# Patient Record
Sex: Female | Born: 1965
Health system: Southern US, Community
[De-identification: ages and names within clinical notes are randomized; demographics above are authoritative.]

## PROBLEM LIST (undated history)

## (undated) DIAGNOSIS — I714 Abdominal aortic aneurysm, without rupture, unspecified: Secondary | ICD-10-CM

## (undated) DIAGNOSIS — R1011 Right upper quadrant pain: Secondary | ICD-10-CM

## (undated) DIAGNOSIS — Z87442 Personal history of urinary calculi: Secondary | ICD-10-CM

## (undated) DIAGNOSIS — R630 Anorexia: Secondary | ICD-10-CM

## (undated) DIAGNOSIS — F17200 Nicotine dependence, unspecified, uncomplicated: Secondary | ICD-10-CM

## (undated) DIAGNOSIS — I2510 Atherosclerotic heart disease of native coronary artery without angina pectoris: Secondary | ICD-10-CM

## (undated) DIAGNOSIS — R270 Ataxia, unspecified: Secondary | ICD-10-CM

## (undated) DIAGNOSIS — N390 Urinary tract infection, site not specified: Secondary | ICD-10-CM

## (undated) DIAGNOSIS — I219 Acute myocardial infarction, unspecified: Secondary | ICD-10-CM

## (undated) DIAGNOSIS — I10 Essential (primary) hypertension: Secondary | ICD-10-CM

## (undated) DIAGNOSIS — R109 Unspecified abdominal pain: Secondary | ICD-10-CM

## (undated) DIAGNOSIS — N200 Calculus of kidney: Secondary | ICD-10-CM

## (undated) DIAGNOSIS — R634 Abnormal weight loss: Secondary | ICD-10-CM

## (undated) HISTORY — DX: Abdominal aortic aneurysm, without rupture: I71.4

## (undated) HISTORY — DX: Abdominal aortic aneurysm, without rupture, unspecified: I71.40

## (undated) HISTORY — DX: Unspecified abdominal pain: R10.9

## (undated) HISTORY — PX: LITHOTRIPSY: SUR834

## (undated) HISTORY — DX: Anorexia: R63.0

## (undated) HISTORY — DX: Abnormal weight loss: R63.4

## (undated) HISTORY — DX: Atherosclerotic heart disease of native coronary artery without angina pectoris: I25.10

## (undated) HISTORY — DX: Right upper quadrant pain: R10.11

## (undated) HISTORY — DX: Nicotine dependence, unspecified, uncomplicated: F17.200

---

## 2006-04-21 ENCOUNTER — Emergency Department (HOSPITAL_COMMUNITY): Admission: EM | Admit: 2006-04-21 | Discharge: 2006-04-21 | Payer: Self-pay | Admitting: Emergency Medicine

## 2006-05-12 ENCOUNTER — Ambulatory Visit (HOSPITAL_COMMUNITY): Admission: RE | Admit: 2006-05-12 | Discharge: 2006-05-12 | Payer: Self-pay | Admitting: Urology

## 2006-05-17 ENCOUNTER — Ambulatory Visit (HOSPITAL_COMMUNITY): Admission: RE | Admit: 2006-05-17 | Discharge: 2006-05-17 | Payer: Self-pay | Admitting: Urology

## 2006-05-23 ENCOUNTER — Ambulatory Visit (HOSPITAL_COMMUNITY): Admission: RE | Admit: 2006-05-23 | Discharge: 2006-05-23 | Payer: Self-pay | Admitting: Urology

## 2006-06-23 ENCOUNTER — Ambulatory Visit (HOSPITAL_COMMUNITY): Admission: RE | Admit: 2006-06-23 | Discharge: 2006-06-23 | Payer: Self-pay | Admitting: Urology

## 2012-04-03 ENCOUNTER — Encounter (HOSPITAL_COMMUNITY): Payer: Self-pay | Admitting: *Deleted

## 2012-04-03 ENCOUNTER — Emergency Department (HOSPITAL_COMMUNITY)
Admission: EM | Admit: 2012-04-03 | Discharge: 2012-04-03 | Disposition: A | Discharge status: Home / Self Care | Payer: Self-pay | Attending: Emergency Medicine | Admitting: Emergency Medicine

## 2012-04-03 ENCOUNTER — Emergency Department (HOSPITAL_COMMUNITY): Payer: Self-pay

## 2012-04-03 DIAGNOSIS — R079 Chest pain, unspecified: Secondary | ICD-10-CM | POA: Insufficient documentation

## 2012-04-03 DIAGNOSIS — R42 Dizziness and giddiness: Secondary | ICD-10-CM | POA: Insufficient documentation

## 2012-04-03 DIAGNOSIS — I10 Essential (primary) hypertension: Secondary | ICD-10-CM | POA: Insufficient documentation

## 2012-04-03 DIAGNOSIS — F17200 Nicotine dependence, unspecified, uncomplicated: Secondary | ICD-10-CM | POA: Insufficient documentation

## 2012-04-03 DIAGNOSIS — R109 Unspecified abdominal pain: Secondary | ICD-10-CM | POA: Insufficient documentation

## 2012-04-03 LAB — CBC WITH DIFFERENTIAL/PLATELET
Basophils Absolute: 0 10*3/uL (ref 0.0–0.1)
Basophils Relative: 0 % (ref 0–1)
Eosinophils Absolute: 0.1 10*3/uL (ref 0.0–0.7)
Eosinophils Relative: 1 % (ref 0–5)
HCT: 39.5 % (ref 36.0–46.0)
Hemoglobin: 13.8 g/dL (ref 12.0–15.0)
MCH: 33.7 pg (ref 26.0–34.0)
MCHC: 34.9 g/dL (ref 30.0–36.0)
MCV: 96.3 fL (ref 78.0–100.0)
Monocytes Absolute: 0.5 10*3/uL (ref 0.1–1.0)
Monocytes Relative: 5 % (ref 3–12)
RDW: 12 % (ref 11.5–15.5)

## 2012-04-03 LAB — COMPREHENSIVE METABOLIC PANEL WITH GFR
ALT: 9 U/L (ref 0–35)
AST: 11 U/L (ref 0–37)
Albumin: 4.3 g/dL (ref 3.5–5.2)
Alkaline Phosphatase: 85 U/L (ref 39–117)
BUN: 6 mg/dL (ref 6–23)
CO2: 23 meq/L (ref 19–32)
Calcium: 9.8 mg/dL (ref 8.4–10.5)
Chloride: 102 meq/L (ref 96–112)
Creatinine, Ser: 0.65 mg/dL (ref 0.50–1.10)
GFR calc Af Amer: >90 mL/min
GFR calc non Af Amer: >90 mL/min
Glucose, Bld: 133 mg/dL — ABNORMAL HIGH (ref 70–99)
Potassium: 3.2 meq/L — ABNORMAL LOW (ref 3.5–5.1)
Sodium: 138 meq/L (ref 135–145)
Total Bilirubin: 0.2 mg/dL — ABNORMAL LOW (ref 0.3–1.2)
Total Protein: 7.7 g/dL (ref 6.0–8.3)

## 2012-04-03 LAB — URINALYSIS, ROUTINE W REFLEX MICROSCOPIC
Bilirubin Urine: NEGATIVE
Ketones, ur: NEGATIVE mg/dL
Nitrite: NEGATIVE
pH: 6 (ref 5.0–8.0)

## 2012-04-03 MED ORDER — HYDROCODONE-ACETAMINOPHEN 5-325 MG PO TABS: 1.0000 | ORAL_TABLET | ORAL | Status: AC | PRN

## 2012-04-03 MED ORDER — SODIUM CHLORIDE 0.9 % IV BOLUS (SEPSIS)
500.0000 mL | Freq: Once | INTRAVENOUS | Status: AC
  Administered 2012-04-03: 500 mL via INTRAVENOUS

## 2012-04-03 MED ORDER — ONDANSETRON HCL 4 MG/2ML IJ SOLN
4.0000 mg | Freq: Once | INTRAMUSCULAR | Status: AC
  Administered 2012-04-03: 4 mg via INTRAVENOUS
  Filled 2012-04-03: qty 2

## 2012-04-03 MED ORDER — SODIUM CHLORIDE 0.9 % IV SOLN
INTRAVENOUS | Status: DC
  Administered 2012-04-03: 11:00:00 via INTRAVENOUS

## 2012-04-03 MED ORDER — ONDANSETRON HCL 8 MG PO TABS: 8.0000 mg | ORAL_TABLET | ORAL | Status: AC | PRN

## 2012-04-03 MED ORDER — HYDROMORPHONE HCL PF 1 MG/ML IJ SOLN
0.5000 mg | Freq: Once | INTRAMUSCULAR | Status: AC
  Administered 2012-04-03: 0.5 mg via INTRAVENOUS
  Filled 2012-04-03: qty 1

## 2012-04-03 MED ORDER — IOHEXOL 300 MG/ML  SOLN
100.0000 mL | Freq: Once | INTRAMUSCULAR | Status: AC | PRN
  Administered 2012-04-03: 100 mL via INTRAVENOUS

## 2012-04-03 MED ORDER — LISINOPRIL-HYDROCHLOROTHIAZIDE 10-12.5 MG PO TABS: 1.0000 | ORAL_TABLET | Freq: Every day | ORAL | Status: DC

## 2012-04-03 NOTE — ED Notes (Addendum)
Pt c/o pain to right side of chest area, pt states that she has problems with pain in this area since having kidney stones "crushed" several years ago, but pain is worse over the past day or so, pt also started experiencing dizziness this am around 6:00, pt states that she usually gets dizzy when her blood pressure is elevated, bp at home was 213/118, pt also states that she had several sharp pains in left side of chest area this am, admits to n/v with the sharp chest pains, pt reports weight loss of 15 pounds recently due to diarrhea.

## 2012-04-03 NOTE — ED Notes (Signed)
Given requested water to drink, md notified and order obtained.

## 2012-04-03 NOTE — ED Notes (Signed)
Pt up to bathroom, voices no complaints, awaiting ct scan

## 2012-04-03 NOTE — ED Provider Notes (Signed)
History    This chart was scribed for Tara Hutching, MD, MD by Smitty Pluck. The patient was seen in room APA10 and the patient's care was started at 10:21AM.   CSN: [id number]  Arrival date & time 04/03/12  8295   First MD Initiated Contact with Patient 04/03/12 1021      Chief Complaint  Patient presents with  . Chest Pain  . Abdominal Pain  . Dizziness    (Consider location/radiation/quality/duration/timing/severity/associated sxs/prior treatment) The history is provided by the patient. No language interpreter was used.   Tara Bright is a 46 y.o. female who presents to the Emergency Department complaining of constant, moderate right flank onset 2 months. Pt reports hx of kidney stones (right 14 cm kidney stone removed by lithotripsy in 2007 by Dr Sharmon Revere). Pt reports that pain is aggravated by movement. Pt reports that she has lost approximately 15 lbs due to vomiting and diarrhea. Pt reports that her urine color has changed to orange but denies dysuria. Reports having chills. Pt reports HTN (stopped taking medication in 2004).    Pt does not have PCP   History reviewed. No pertinent past medical history.  Past Surgical History  Procedure Date  . Lithotripsy     No family history on file.  History  Substance Use Topics  . Smoking status: Current Some Day Smoker  . Smokeless tobacco: Not on file  . Alcohol Use: No    OB History    Grav Para Term Preterm Abortions TAB SAB Ect Mult Living                  Review of Systems  All other systems reviewed and are negative.  10 Systems reviewed and all are negative for acute change except as noted in the HPI.   Allergies  Codeine  Home Medications  No current outpatient prescriptions on file.  BP 192/111  Pulse 105  Temp 98.3 F (36.8 C)  Resp 18  Ht 5\' 3"  (1.6 m)  Wt 123 lb (55.792 kg)  BMI 21.79 kg/m2  SpO2 100%  Physical Exam  Nursing note and vitals reviewed. Constitutional: She is oriented to  person, place, and time. She appears well-developed and well-nourished.  HENT:  Head: Normocephalic and atraumatic.  Eyes: Conjunctivae and EOM are normal. Pupils are equal, round, and reactive to light.  Neck: Normal range of motion. Neck supple.  Cardiovascular: Normal rate, regular rhythm and normal heart sounds.   Pulmonary/Chest: Effort normal and breath sounds normal.  Abdominal: Soft. Bowel sounds are normal. There is tenderness (right flank).  Musculoskeletal: Normal range of motion.  Neurological: She is alert and oriented to person, place, and time.  Skin: Skin is warm and dry.  Psychiatric: She has a normal mood and affect.    ED Course  Procedures (including critical care time) DIAGNOSTIC STUDIES: Oxygen Saturation is 100% on room air, normal by my interpretation.    COORDINATION OF CARE: 10:27 AM Discussed pt ED treatment with pt  10:45 AM Ordered:   Medications  0.9 %  sodium chloride infusion (  Intravenous New Bag/Given 04/03/12 1120)  ibuprofen (ADVIL,MOTRIN) 200 MG tablet (not administered)  tetrahydrozoline (VISINE) 0.05 % ophthalmic solution (not administered)  sodium chloride 0.9 % bolus 500 mL (500 mL Intravenous Given 04/03/12 1047)  HYDROmorphone (DILAUDID) injection 0.5 mg (0.5 mg Intravenous Given 04/03/12 1047)  ondansetron (ZOFRAN) injection 4 mg (4 mg Intravenous Given 04/03/12 1047)  iohexol (OMNIPAQUE) 300 MG/ML solution 100 mL (100  mL Intravenous Contrast Given 04/03/12 1154)       Labs Reviewed  COMPREHENSIVE METABOLIC PANEL - Abnormal; Notable for the following:    Potassium 3.2 (*)     Glucose, Bld 133 (*)     Total Bilirubin 0.2 (*)     All other components within normal limits  URINALYSIS, ROUTINE W REFLEX MICROSCOPIC - Abnormal; Notable for the following:    Color, Urine STRAW (*)     Specific Gravity, Urine <1.005 (*)     Hgb urine dipstick MODERATE (*)     All other components within normal limits  CBC WITH DIFFERENTIAL  URINE  MICROSCOPIC-ADD ON   Ct Abdomen Pelvis W Contrast  04/03/2012  *RADIOLOGY REPORT*  Clinical Data: Right flank pain and abdominal pain for 2 months, worse in the last 2 weeks.  CT ABDOMEN AND PELVIS WITH CONTRAST  Technique:  Multidetector CT imaging of the abdomen and pelvis was performed following the standard protocol during bolus administration of intravenous contrast.  Contrast: OMNIPAQUE IOHEXOL 300 MG/ML  SOLN  Comparison: 09/28 of seven  Findings: Minor atelectasis at the dependent lung bases.  The lung bases are otherwise clear.  The heart is normal in size.  The liver, spleen, gallbladder and pancreas are within normal limits.  No bile duct dilation.  No adrenal masses.  2 mm nonobstructing stone in the mid pole of the right kidney. There is a dilated calix of the right kidney upper pole.  No renal masses.  No hydronephrosis.  There is symmetric renal enhancement and excretion.  Normal ureters, with no evidence of a ureteral stone.  Normal bladder.  Normal uterus.  Left ovary demonstrates a dominant cyst measuring approximate 3.3 cm.  Trace amount pelvic free fluid is presumed to be physiologic.  No adenopathy.  There is mild dilation of the infrarenal abdominal aorta where there is also atherosclerotic disease.  It measures 2.4 cm in greatest anterior posterior dimension.  The bowel is unremarkable. I do not appreciate a normal appendix, but there are no findings of acute appendicitis.  There are degenerative changes noted of the lower thoracic and lumbar spine, relatively mild.  No osteoblastic or osteolytic lesions.  IMPRESSION: No acute findings.  2 mm nonobstructing stone noted in the mid pole right kidney.  No ureteral stones or signs for obstruction.  Normal appendix is not discretely seen, but no evidence of appendicitis.  Chronic findings include ectasia and atherosclerosis of the abdominal aorta.  There are also degenerative changes of the visualized spine.  Note is made of a left ovarian  cyst.  This is presumed to be physiologic.  Mild lung base atelectasis.   Original Report Authenticated By: Domenic Moras, M.D.      No diagnosis found.  Date: 04/03/2012  Rate: 83  Rhythm: normal sinus rhythm  QRS Axis: normal  Intervals: normal  ST/T Wave abnormalities: normal  Conduction Disutrbances: none  Narrative Interpretation: unremarkable      MDM  Test results discussed with patient. Small stone in the mid pole of the right kidney.  Discussed patient's long-standing hypertension. Will start combination ACE inhibitor, diuretic.  Patient understands to get primary care followup.  I personally performed the services described in this documentation, which was scribed in my presence. The recorded information has been reviewed and considered.        Tara Hutching, MD 04/03/12 [phone]

## 2012-04-03 NOTE — ED Notes (Signed)
Warm blankets given.  Pt resting.

## 2014-07-25 DIAGNOSIS — R270 Ataxia, unspecified: Secondary | ICD-10-CM

## 2014-07-25 HISTORY — DX: Ataxia, unspecified: R27.0

## 2014-09-21 ENCOUNTER — Encounter (HOSPITAL_COMMUNITY): Payer: Self-pay | Admitting: Emergency Medicine

## 2014-09-21 ENCOUNTER — Emergency Department (HOSPITAL_COMMUNITY)
Admission: EM | Admit: 2014-09-21 | Discharge: 2014-09-21 | Discharge status: Against Medical Advice | Payer: Self-pay | Attending: Emergency Medicine | Admitting: Emergency Medicine

## 2014-09-21 ENCOUNTER — Emergency Department (HOSPITAL_COMMUNITY): Payer: Self-pay

## 2014-09-21 DIAGNOSIS — Z880 Allergy status to penicillin: Secondary | ICD-10-CM | POA: Insufficient documentation

## 2014-09-21 DIAGNOSIS — Z87448 Personal history of other diseases of urinary system: Secondary | ICD-10-CM | POA: Insufficient documentation

## 2014-09-21 DIAGNOSIS — H539 Unspecified visual disturbance: Secondary | ICD-10-CM | POA: Insufficient documentation

## 2014-09-21 DIAGNOSIS — F1310 Sedative, hypnotic or anxiolytic abuse, uncomplicated: Secondary | ICD-10-CM | POA: Insufficient documentation

## 2014-09-21 DIAGNOSIS — R260 Ataxic gait: Secondary | ICD-10-CM | POA: Insufficient documentation

## 2014-09-21 DIAGNOSIS — Z720 Tobacco use: Secondary | ICD-10-CM | POA: Insufficient documentation

## 2014-09-21 DIAGNOSIS — R270 Ataxia, unspecified: Secondary | ICD-10-CM

## 2014-09-21 DIAGNOSIS — Z7982 Long term (current) use of aspirin: Secondary | ICD-10-CM | POA: Insufficient documentation

## 2014-09-21 DIAGNOSIS — Z79899 Other long term (current) drug therapy: Secondary | ICD-10-CM | POA: Insufficient documentation

## 2014-09-21 LAB — RAPID URINE DRUG SCREEN, HOSP PERFORMED
Amphetamines: NOT DETECTED
Barbiturates: NOT DETECTED
Benzodiazepines: POSITIVE — AB
COCAINE: NOT DETECTED
Opiates: NOT DETECTED
TETRAHYDROCANNABINOL: NOT DETECTED

## 2014-09-21 LAB — CBC
HCT: 39.4 % (ref 36.0–46.0)
Hemoglobin: 14 g/dL (ref 12.0–15.0)
MCH: 34.1 pg — ABNORMAL HIGH (ref 26.0–34.0)
MCHC: 35.5 g/dL (ref 30.0–36.0)
MCV: 95.9 fL (ref 78.0–100.0)
Platelets: 221 10*3/uL (ref 150–400)
RBC: 4.11 MIL/uL (ref 3.87–5.11)
RDW: 11.6 % (ref 11.5–15.5)
WBC: 7.7 10*3/uL (ref 4.0–10.5)

## 2014-09-21 LAB — DIFFERENTIAL
BASOS ABS: 0 10*3/uL (ref 0.0–0.1)
Basophils Relative: 0 % (ref 0–1)
Eosinophils Absolute: 0.1 10*3/uL (ref 0.0–0.7)
Eosinophils Relative: 1 % (ref 0–5)
LYMPHS ABS: 2.6 10*3/uL (ref 0.7–4.0)
Lymphocytes Relative: 34 % (ref 12–46)
Monocytes Absolute: 0.6 10*3/uL (ref 0.1–1.0)
Monocytes Relative: 7 % (ref 3–12)
NEUTROS PCT: 58 % (ref 43–77)
Neutro Abs: 4.4 10*3/uL (ref 1.7–7.7)

## 2014-09-21 LAB — COMPREHENSIVE METABOLIC PANEL
ALT: 21 U/L (ref 0–35)
ANION GAP: 5 (ref 5–15)
AST: 20 U/L (ref 0–37)
Albumin: 4.5 g/dL (ref 3.5–5.2)
Alkaline Phosphatase: 59 U/L (ref 39–117)
BILIRUBIN TOTAL: 0.2 mg/dL — AB (ref 0.3–1.2)
BUN: 12 mg/dL (ref 6–23)
CHLORIDE: 111 mmol/L (ref 96–112)
CO2: 23 mmol/L (ref 19–32)
Calcium: 9.2 mg/dL (ref 8.4–10.5)
Creatinine, Ser: 0.67 mg/dL (ref 0.50–1.10)
GFR calc Af Amer: >90 mL/min (ref >90)
GFR calc non Af Amer: >90 mL/min (ref >90)
GLUCOSE: 81 mg/dL (ref 70–99)
POTASSIUM: 3.8 mmol/L (ref 3.5–5.1)
Sodium: 139 mmol/L (ref 135–145)
Total Protein: 7.6 g/dL (ref 6.0–8.3)

## 2014-09-21 LAB — URINALYSIS, ROUTINE W REFLEX MICROSCOPIC
Bilirubin Urine: NEGATIVE
GLUCOSE, UA: NEGATIVE mg/dL
KETONES UR: NEGATIVE mg/dL
Leukocytes, UA: NEGATIVE
NITRITE: NEGATIVE
PH: 6 (ref 5.0–8.0)
Protein, ur: NEGATIVE mg/dL
Specific Gravity, Urine: <1.005 — ABNORMAL LOW (ref 1.005–1.030)
Urobilinogen, UA: 0.2 mg/dL (ref 0.0–1.0)

## 2014-09-21 LAB — CBG MONITORING, ED: Glucose-Capillary: 78 mg/dL (ref 70–99)

## 2014-09-21 LAB — URINE MICROSCOPIC-ADD ON

## 2014-09-21 LAB — ETHANOL: Alcohol, Ethyl (B): <5 mg/dL (ref 0–9)

## 2014-09-21 LAB — APTT: aPTT: 30 seconds (ref 24–37)

## 2014-09-21 LAB — PROTIME-INR
INR: 0.96 (ref 0.00–1.49)
Prothrombin Time: 12.9 seconds (ref 11.6–15.2)

## 2014-09-21 LAB — TROPONIN I

## 2014-09-21 NOTE — ED Notes (Signed)
Pt is requesting to leave. Pt made aware of all health care related consequences that could result in her leaving.

## 2014-09-21 NOTE — ED Provider Notes (Signed)
CSN: [id number]     Arrival date & time 09/21/14  1234 History  This chart was scribed for Tara Essex, MD by Stephania Fragmin, ED Scribe. This patient was seen in room APA08/APA08 and the patient's care was started at 1:50 PM.    Chief Complaint  Patient presents with  . Headache   The history is provided by the patient. No language interpreter was used.    HPI Comments: Tara Bright is a 49 y.o. female who presents to the Emergency Department complaining of constant, severe dizziness, blurred vision in her right eye, high blood pressure, and pounding in her chest that worsened today. She endorses photophobia and SOB. Patient's last normal was 8 days ago -- she states she believes she had a mild stroke 7 days ago because she had sudden loss of the upper half of her vision in her right eye for a day and a half. She states her vision has returned, but it is still blurry. She also had the same dizziness and pounding in her chest all week, but states they were episodic and would come and go.  Patient denies taking medication for hypertension. Lying and sitting alleviate her dizziness. Patient has a history of kidney stones but no other past medical history, though she adds she hasn't seen a doctor in years because she doesn't like going to the doctor's. She is a smoker. She denies abdominal pain, chest pain, or sensitivity to noise. Patient denies drug use. She has never worn contacts.  PCP: none    Past Medical History  Diagnosis Date  . Renal disorder    Past Surgical History  Procedure Laterality Date  . Lithotripsy     Family History  Problem Relation Age of Onset  . Cancer Mother   . Diabetes Father   . Heart failure Father    History  Substance Use Topics  . Smoking status: Current Some Day Smoker -- 0.50 packs/day    Types: Cigarettes  . Smokeless tobacco: Never Used  . Alcohol Use: No   OB History    Gravida Para Term Preterm AB TAB SAB Ectopic Multiple Living   5 2 2  3  3          Review of Systems  A complete 10 system review of systems was obtained and all systems are negative except as noted in the HPI and PMH.    Allergies  Codeine; Other; and Penicillins  Home Medications   Prior to Admission medications   Medication Sig Start Date End Date Taking? Authorizing Provider  aspirin 325 MG tablet Take 325 mg by mouth daily as needed (blood flow).   Yes Historical Provider, MD  ibuprofen (ADVIL,MOTRIN) 200 MG tablet Take 400 mg by mouth every 6 (six) hours as needed for headache or moderate pain. Pain   Yes Historical Provider, MD  tetrahydrozoline (VISINE) 0.05 % ophthalmic solution Place 1 drop into both eyes daily.   Yes Historical Provider, MD  lisinopril-hydrochlorothiazide (PRINZIDE) 10-12.5 MG per tablet Take 1 tablet by mouth daily. Patient not taking: Reported on 09/21/2014 04/03/12 04/03/13  Nat Christen, MD   BP 172/97 mmHg  Pulse 69  Temp(Src) 98 F (36.7 C) (Oral)  Resp 15  Ht 5\' 3"  (1.6 m)  Wt 130 lb (58.968 kg)  BMI 23.03 kg/m2  SpO2 99%  LMP 09/21/2014 (Exact Date) Physical Exam  Constitutional: She is oriented to person, place, and time. She appears well-developed and well-nourished. No distress.  HENT:  Head: Normocephalic  and atraumatic.  Mouth/Throat: Oropharynx is clear and moist. No oropharyngeal exudate.  Eyes: Conjunctivae and EOM are normal. Pupils are equal, round, and reactive to light.  Neck: Normal range of motion. Neck supple.  No meningismus.  Cardiovascular: Normal rate, regular rhythm, normal heart sounds and intact distal pulses.   No murmur heard. Pulmonary/Chest: Effort normal and breath sounds normal. No respiratory distress.  Abdominal: Soft. There is no tenderness. There is no rebound and no guarding.  Musculoskeletal: Normal range of motion. She exhibits no edema or tenderness.  Neurological: She is alert and oriented to person, place, and time. No cranial nerve deficit. She exhibits normal muscle tone.  Coordination normal.  Superior visual field cut in the right eye. Positive Romberg. Ataxic gait.  No ataxia on finger to nose bilaterally. No pronator drift. 5/5 strength throughout. CN 2-12 intact. Equal grip strength. Sensation intact.   Skin: Skin is warm.  Psychiatric: She has a normal mood and affect. Her behavior is normal.  Nursing note and vitals reviewed.   ED Course  Procedures (including critical care time)  DIAGNOSTIC STUDIES: Oxygen Saturation is 100% on room air, normal by my interpretation.    COORDINATION OF CARE: 1:59 PM - Discussed treatment plan with pt at bedside which includes blood tests and CT scan, and pt agreed to plan.   Labs Review Labs Reviewed  CBC - Abnormal; Notable for the following:    MCH 34.1 (*)    All other components within normal limits  COMPREHENSIVE METABOLIC PANEL - Abnormal; Notable for the following:    Total Bilirubin 0.2 (*)    All other components within normal limits  URINE RAPID DRUG SCREEN (HOSP PERFORMED) - Abnormal; Notable for the following:    Benzodiazepines POSITIVE (*)    All other components within normal limits  URINALYSIS, ROUTINE W REFLEX MICROSCOPIC - Abnormal; Notable for the following:    Specific Gravity, Urine <1.005 (*)    Hgb urine dipstick LARGE (*)    All other components within normal limits  URINE MICROSCOPIC-ADD ON - Abnormal; Notable for the following:    Squamous Epithelial / LPF FEW (*)    All other components within normal limits  ETHANOL  PROTIME-INR  APTT  DIFFERENTIAL  TROPONIN I  CBG MONITORING, ED    Imaging Review Ct Head Wo Contrast  09/21/2014   CLINICAL DATA:  Visual field defect in the right eye 7 days ago. Right arm weakness. Abnormal sensation in the right side of the face. Intermittent dizziness.  EXAM: CT HEAD WITHOUT CONTRAST  TECHNIQUE: Contiguous axial images were obtained from the base of the skull through the vertex without intravenous contrast.  COMPARISON:  None.  FINDINGS:  No acute cortical infarct, hemorrhage, or mass lesion is present. The ventricles are of normal size. No significant extra-axial fluid collection is evident. The paranasal sinuses and mastoid air cells are clear. The calvarium is intact.  IMPRESSION: Negative CT of the head.   Electronically Signed   By: San Morelle M.D.   On: 09/21/2014 15:07     EKG Interpretation   Date/Time:  Sunday September 21 2014 13:22:27 EST Ventricular Rate:  76 PR Interval:  152 QRS Duration: 78 QT Interval:  376 QTC Calculation: 423 R Axis:   76 Text Interpretation:  Sinus rhythm No previous ECGs available Confirmed by  Gerson Fauth  MD, Nusaybah Ivie ([id number]) on 09/21/2014 3:00:25 PM     3:15 PM - Discussed test results, including negative CT scan, with patient. Discussed my  recommendation for hospital admission with patient, who states that she doesn't want to stay. I am giving her time to consider her decision.  MDM   Final diagnoses:  Visual disturbance  Ataxia   Visual field change one week ago with intermittent headaches, dizziness, difficulty walking and ataxia. No chest pain or shortness of breath.  Code stroke not activated due to delay in presentation.  CT negative for acute pathology. Labs unrevealing. Blood pressure has improved to 172/97.  Concern for stroke given patient's visual field change, ataxic gait and positive Romberg. Patient is adamant that she does not to be admitted. She understands that she could have a stroke which could be incapacitating and lead to death. Her blood pressure is uncontrolled.  Informed by nurse that patient left AMA. Patient stated she did not want to stay. She appears to have Capacity to make medical decisions. She understood that she could be having a stroke which can be incapacitating early to death. Her blood pressure is uncontrolled which could lead to stroke, heart attack, kidney damage, eye damage and possibly death.  I personally performed the services  described in this documentation, which was scribed in my presence. The recorded information has been reviewed and is accurate.   Tara Essex, MD 09/21/14 2130

## 2014-09-21 NOTE — ED Notes (Signed)
MD at bedside. 

## 2014-09-21 NOTE — ED Notes (Signed)
Pt reports R sided headache with dizziness, symptom onset last Sun. Pt also reports dizziness.

## 2016-06-21 ENCOUNTER — Emergency Department (HOSPITAL_COMMUNITY)
Admission: EM | Admit: 2016-06-21 | Discharge: 2016-06-21 | Disposition: A | Discharge status: Home / Self Care | Payer: Self-pay

## 2016-06-22 ENCOUNTER — Encounter (HOSPITAL_COMMUNITY): Payer: Self-pay | Admitting: *Deleted

## 2016-06-22 ENCOUNTER — Emergency Department (HOSPITAL_COMMUNITY)
Admission: EM | Admit: 2016-06-22 | Discharge: 2016-06-22 | Disposition: A | Discharge status: Home / Self Care | Payer: Self-pay | Attending: Emergency Medicine | Admitting: Emergency Medicine

## 2016-06-22 ENCOUNTER — Emergency Department (HOSPITAL_COMMUNITY): Payer: Self-pay

## 2016-06-22 DIAGNOSIS — R197 Diarrhea, unspecified: Secondary | ICD-10-CM | POA: Insufficient documentation

## 2016-06-22 DIAGNOSIS — N390 Urinary tract infection, site not specified: Secondary | ICD-10-CM | POA: Insufficient documentation

## 2016-06-22 DIAGNOSIS — N939 Abnormal uterine and vaginal bleeding, unspecified: Secondary | ICD-10-CM | POA: Insufficient documentation

## 2016-06-22 DIAGNOSIS — Z79899 Other long term (current) drug therapy: Secondary | ICD-10-CM | POA: Insufficient documentation

## 2016-06-22 DIAGNOSIS — F17210 Nicotine dependence, cigarettes, uncomplicated: Secondary | ICD-10-CM | POA: Insufficient documentation

## 2016-06-22 LAB — URINE MICROSCOPIC-ADD ON

## 2016-06-22 LAB — PREGNANCY, URINE: PREG TEST UR: NEGATIVE

## 2016-06-22 LAB — BASIC METABOLIC PANEL
ANION GAP: 10 (ref 5–15)
BUN: 12 mg/dL (ref 6–20)
CALCIUM: 9.4 mg/dL (ref 8.9–10.3)
CHLORIDE: 103 mmol/L (ref 101–111)
CO2: 25 mmol/L (ref 22–32)
Creatinine, Ser: 0.68 mg/dL (ref 0.44–1.00)
GFR calc non Af Amer: >60 mL/min (ref >60)
GLUCOSE: 92 mg/dL (ref 65–99)
POTASSIUM: 3.7 mmol/L (ref 3.5–5.1)
Sodium: 138 mmol/L (ref 135–145)

## 2016-06-22 LAB — CBC WITH DIFFERENTIAL/PLATELET
BASOS ABS: 0 10*3/uL (ref 0.0–0.1)
Basophils Relative: 0 %
Eosinophils Absolute: 0.1 10*3/uL (ref 0.0–0.7)
Eosinophils Relative: 1 %
HEMATOCRIT: 35.8 % — AB (ref 36.0–46.0)
HEMOGLOBIN: 12.1 g/dL (ref 12.0–15.0)
LYMPHS PCT: 19 %
Lymphs Abs: 1.9 10*3/uL (ref 0.7–4.0)
MCH: 34.3 pg — ABNORMAL HIGH (ref 26.0–34.0)
MCHC: 33.8 g/dL (ref 30.0–36.0)
MCV: 101.4 fL — ABNORMAL HIGH (ref 78.0–100.0)
Monocytes Absolute: 0.8 10*3/uL (ref 0.1–1.0)
Monocytes Relative: 8 %
NEUTROS ABS: 7.1 10*3/uL (ref 1.7–7.7)
Neutrophils Relative %: 72 %
Platelets: 309 10*3/uL (ref 150–400)
RBC: 3.53 MIL/uL — ABNORMAL LOW (ref 3.87–5.11)
RDW: 12.8 % (ref 11.5–15.5)
WBC: 9.9 10*3/uL (ref 4.0–10.5)

## 2016-06-22 LAB — URINALYSIS, ROUTINE W REFLEX MICROSCOPIC
Bilirubin Urine: NEGATIVE
Glucose, UA: NEGATIVE mg/dL
Ketones, ur: NEGATIVE mg/dL
Nitrite: POSITIVE — AB
PH: 6 (ref 5.0–8.0)
Specific Gravity, Urine: 1.01 (ref 1.005–1.030)

## 2016-06-22 LAB — WET PREP, GENITAL
Clue Cells Wet Prep HPF POC: NONE SEEN
SPERM: NONE SEEN
Trich, Wet Prep: NONE SEEN
Yeast Wet Prep HPF POC: NONE SEEN

## 2016-06-22 MED ORDER — DEXTROSE 5 % IV SOLN
1.0000 g | Freq: Once | INTRAVENOUS | Status: AC
  Administered 2016-06-22: 1 g via INTRAVENOUS
  Filled 2016-06-22: qty 10

## 2016-06-22 MED ORDER — OXYCODONE-ACETAMINOPHEN 5-325 MG PO TABS: 1.0000 | ORAL_TABLET | Freq: Four times a day (QID) | ORAL | 0 refills | Status: DC | PRN

## 2016-06-22 MED ORDER — OXYCODONE-ACETAMINOPHEN 5-325 MG PO TABS
ORAL_TABLET | ORAL | Status: AC
  Filled 2016-06-22: qty 1

## 2016-06-22 MED ORDER — CEPHALEXIN 500 MG PO CAPS: 500.0000 mg | ORAL_CAPSULE | Freq: Four times a day (QID) | ORAL | 0 refills | Status: DC

## 2016-06-22 MED ORDER — MEDROXYPROGESTERONE ACETATE 10 MG PO TABS: 10.0000 mg | ORAL_TABLET | Freq: Every day | ORAL | 0 refills | Status: DC

## 2016-06-22 MED ORDER — OXYCODONE-ACETAMINOPHEN 5-325 MG PO TABS
1.0000 | ORAL_TABLET | Freq: Once | ORAL | Status: AC
  Administered 2016-06-22: 1 via ORAL

## 2016-06-22 NOTE — Discharge Instructions (Signed)
I have given you a prescription for an antibiotic (Keflex) to use four times a day for 7 days. Please return if your symptoms worsen or fail to improve over the next week. I have also given you a pill to help stop your vaginal bleeding. Please follow up with Ob/Gyn. Note there is increased risk of blood clots with this medication.

## 2016-06-22 NOTE — ED Provider Notes (Signed)
[facility] DEPT Provider Note   CSN: [id number] Arrival date & time: 06/22/16  1001  History   Chief Complaint Chief Complaint  Patient presents with  . Flank Pain  . Vaginal Bleeding   HPI Tara Bright is a 50 y.o. female.  HPI Presenting with vaginal bleeding with clots, weakness, right flank pain, and foul odor to urine for 2 weeks. Reports she is perimenopausal. Using 1 pad per hour, about 10 pads per day. Also notes nausea and diarrhea as well as urinary frequency, urgency, and dysuria. Notes some hematuria, but unsure how long given vaginal bleeding. Denies vomiting. Unknown fever. Notes 13 years ago she was supposed to have a hysterectomy, however she had to unexpectedly move after her father passed away and did not have this done. No recent pap smears. Notes history of renal stones, last occurring 3 years ago. Smoker. Past Medical History:  Diagnosis Date  . Renal disorder     There are no active problems to display for this patient.   Past Surgical History:  Procedure Laterality Date  . LITHOTRIPSY      OB History    Gravida Para Term Preterm AB Living   5 2 2   3      SAB TAB Ectopic Multiple Live Births   3               Home Medications    Prior to Admission medications   Medication Sig Start Date End Date Taking? Authorizing Provider  acetaminophen (TYLENOL) 500 MG tablet Take 1,000 mg by mouth every 4 (four) hours as needed.   Yes Historical Provider, MD  CRANBERRY PO Take 2 tablets by mouth daily.   Yes Historical Provider, MD  ferrous sulfate 325 (65 FE) MG tablet Take 325 mg by mouth daily with breakfast.   Yes Historical Provider, MD  tetrahydrozoline (VISINE) 0.05 % ophthalmic solution Place 1 drop into both eyes daily.   Yes Historical Provider, MD  cephALEXin (KEFLEX) 500 MG capsule Take 1 capsule (500 mg total) by mouth 4 (four) times daily. 06/22/16   [place]ad[place] N Rumley, DO  medroxyPROGESTERone (PROVERA) 10 MG tablet Take 1 tablet (10 mg  total) by mouth daily. 06/22/16   [place] N Rumley, DO  oxyCODONE-acetaminophen (PERCOCET/ROXICET) 5-325 MG tablet Take 1 tablet by mouth every 6 (six) hours as needed for severe pain. 06/22/16   Lorna Few, DO    Family History Family History  Problem Relation Age of Onset  . Cancer Mother   . Diabetes Father   . Heart failure Father     Social History Social History  Substance Use Topics  . Smoking status: Current Some Day Smoker    Packs/day: 0.50    Types: Cigarettes  . Smokeless tobacco: Never Used  . Alcohol use No     Allergies   Codeine; Other; and Penicillins   Review of Systems Review of Systems  Constitutional: Negative for fever.  Respiratory: Negative for shortness of breath.   Cardiovascular: Negative for chest pain.  Gastrointestinal: Positive for diarrhea and nausea. Negative for abdominal pain and vomiting.  Genitourinary: Positive for dysuria, frequency, hematuria and urgency.  Skin: Negative for rash.  Neurological: Positive for weakness.     Physical Exam Updated Vital Signs BP 128/84   Pulse 78   Temp 98.1 F (36.7 C) (Oral)   Resp 18   Ht 5\' 3"  (1.6 m)   Wt 57.2 kg   LMP 06/08/2016   SpO2 99%  BMI 22.32 kg/m   Physical Exam  Constitutional: She is oriented to person, place, and time. She appears well-developed and well-nourished. No distress.  HENT:  Head: Normocephalic and atraumatic.  Eyes: Conjunctivae are normal.  Cardiovascular: Normal rate and regular rhythm.   No murmur heard. Pulmonary/Chest: Effort normal. No respiratory distress. She has no wheezes.  Abdominal: Soft. Bowel sounds are normal. She exhibits no distension. There is no tenderness.  CVA tenderness noted  Genitourinary:  Genitourinary Comments: Blood and clots noted in vaginal vault. No cervical motion tenderness noted. No vaginal wall tenderness. Mild tenderness in left adnexa.   Musculoskeletal: She exhibits no edema.  Lymphadenopathy:    She has no  cervical adenopathy.  Neurological: She is alert and oriented to person, place, and time.  Skin: No rash noted.  Psychiatric: She has a normal mood and affect. Her behavior is normal.    ED Treatments / Results  Labs (all labs ordered are listed, but only abnormal results are displayed) Labs Reviewed  URINALYSIS, ROUTINE W REFLEX MICROSCOPIC (NOT AT [facility]) - Abnormal; Notable for the following:       Result Value   APPearance HAZY (*)    Hgb urine dipstick LARGE (*)    Protein, ur TRACE (*)    Nitrite POSITIVE (*)    Leukocytes, UA LARGE (*)    All other components within normal limits  CBC WITH DIFFERENTIAL/PLATELET - Abnormal; Notable for the following:    RBC 3.53 (*)    HCT 35.8 (*)    MCV 101.4 (*)    MCH 34.3 (*)    All other components within normal limits  URINE MICROSCOPIC-ADD ON - Abnormal; Notable for the following:    Squamous Epithelial / LPF 0-5 (*)    Bacteria, UA MANY (*)    All other components within normal limits  WET PREP, GENITAL  URINE CULTURE  BASIC METABOLIC PANEL  PREGNANCY, URINE  CERVICOVAGINAL ANCILLARY ONLY    EKG  EKG Interpretation None      Radiology US Transvaginal Non-ob  Result Date: 06/22/2016 CLINICAL DATA:  Right flank pain, vaginal bleeding. EXAM: TRANSABDOMINAL AND TRANSVAGINAL ULTRASOUND OF PELVIS TECHNIQUE: Both transabdominal and transvaginal ultrasound examinations of the pelvis were performed. Transabdominal technique was performed for global imaging of the pelvis including uterus, ovaries, adnexal regions, and pelvic cul-de-sac. It was necessary to proceed with endovaginal exam following the transabdominal exam to visualize the endometrium and ovaries. COMPARISON:  None FINDINGS: Uterus Measurements: 10.0 x 5.7 x 4.9 cm. No fibroids or other mass visualized. Endometrium Thickness: 15 mm.  No focal abnormality visualized. Right ovary Measurements: 3.1 x 2.6 x 2.3 cm. 1.7 cm follicular cyst is noted. Left ovary Measurements: 1.8  x 1.8 x 1.5 cm. Normal appearance/no adnexal mass. Other findings No abnormal free fluid. IMPRESSION: Endometrial thickness is measured at 15 mm which is within normal limits for premenopausal patient. If bleeding remains unresponsive to hormonal or medical therapy, sonohysterogram should be considered for focal lesion work-up. (Ref: Radiological Reasoning: Algorithmic Workup of Abnormal Vaginal Bleeding with Endovaginal Sonography and Sonohysterography. AJR [id number]) Electronically Signed   By: Marijo Conception, M.D.   On: 06/22/2016 13:04   US Pelvis Complete  Result Date: 06/22/2016 CLINICAL DATA:  Right flank pain, vaginal bleeding. EXAM: TRANSABDOMINAL AND TRANSVAGINAL ULTRASOUND OF PELVIS TECHNIQUE: Both transabdominal and transvaginal ultrasound examinations of the pelvis were performed. Transabdominal technique was performed for global imaging of the pelvis including uterus, ovaries, adnexal regions, and pelvic cul-de-sac. It  was necessary to proceed with endovaginal exam following the transabdominal exam to visualize the endometrium and ovaries. COMPARISON:  None FINDINGS: Uterus Measurements: 10.0 x 5.7 x 4.9 cm. No fibroids or other mass visualized. Endometrium Thickness: 15 mm.  No focal abnormality visualized. Right ovary Measurements: 3.1 x 2.6 x 2.3 cm. 1.7 cm follicular cyst is noted. Left ovary Measurements: 1.8 x 1.8 x 1.5 cm. Normal appearance/no adnexal mass. Other findings No abnormal free fluid. IMPRESSION: Endometrial thickness is measured at 15 mm which is within normal limits for premenopausal patient. If bleeding remains unresponsive to hormonal or medical therapy, sonohysterogram should be considered for focal lesion work-up. (Ref: Radiological Reasoning: Algorithmic Workup of Abnormal Vaginal Bleeding with Endovaginal Sonography and Sonohysterography. AJR [id number]) Electronically Signed   By: Marijo Conception, M.D.   On: 06/22/2016 13:04    Procedures Procedures  (including critical care time)  Medications Ordered in ED Medications  oxyCODONE-acetaminophen (PERCOCET/ROXICET) 5-325 MG per tablet 1 tablet (not administered)  cefTRIAXone (ROCEPHIN) 1 g in dextrose 5 % 50 mL IVPB (1 g Intravenous New Bag/Given 06/22/16 1333)     Initial Impression / Assessment and Plan / ED Course  I have reviewed the triage vital signs and the nursing notes.  Pertinent labs & imaging results that were available during my care of the patient were reviewed by me and considered in my medical decision making (see chart for details).  Clinical Course   - BMP normal - CBC normal - Urinalysis with large hemoglobin, large leukocytes, positive nitrite. Microscopy with too numerous to count RBC and WBC, many bacteria. - Pelvic US with normal endometrial stripe, follicular cysts noted.  Final Clinical Impressions(s) / ED Diagnoses   Final diagnoses:  Vaginal bleeding  Lower urinary tract infectious disease  - Keflex prescribed at four times a day dosing with concerns of UTI with possible beginnings of Pyelonephritis. Return precautions discussed. Percocet given due to significant pain on exam.  - Provera given for vaginal bleeding given smoking status and age. CBC normal. Follow up with Ob/Gyn.  - Follow up with PCP  New Prescriptions New Prescriptions   CEPHALEXIN (KEFLEX) 500 MG CAPSULE    Take 1 capsule (500 mg total) by mouth 4 (four) times daily.   MEDROXYPROGESTERONE (PROVERA) 10 MG TABLET    Take 1 tablet (10 mg total) by mouth daily.   OXYCODONE-ACETAMINOPHEN (PERCOCET/ROXICET) 5-325 MG TABLET    Take 1 tablet by mouth every 6 (six) hours as needed for severe pain.     [place], [place] 06/22/16 [place], MD 06/22/16 [phone]

## 2016-06-22 NOTE — ED Triage Notes (Addendum)
Pt c/o vaginal bleeding with clots, weakness, right flank pain foul smelling urine, and feeling dehydrated that started 2 weeks ago. Pt reports she is perimenopausal. Pt reports she is using 1 pad per hour. Reports nausea, diarrhea. Denies vomiting. Unknown fever at home. Pt reports urinary frequency and hematuria that started before the vaginal bleeding.

## 2016-06-23 LAB — CERVICOVAGINAL ANCILLARY ONLY
CHLAMYDIA, DNA PROBE: NEGATIVE
Neisseria Gonorrhea: NEGATIVE

## 2016-06-25 LAB — URINE CULTURE: Culture: >=100000 — AB

## 2016-06-26 ENCOUNTER — Telehealth: Payer: Self-pay

## 2016-06-26 NOTE — Telephone Encounter (Signed)
Post ED Visit - Positive Culture Follow-up  Culture report reviewed by antimicrobial stewardship pharmacist:  []  Elenor Quinones, Pharm.D. []  Heide Guile, Pharm.D., BCPS []  Parks Neptune, Pharm.D. []  Alycia Rossetti, Pharm.D., BCPS []  [place], [place].D., BCPS, AAHIVP []  Legrand Como, Pharm.D., BCPS, AAHIVP []  Milus Glazier, Pharm.D. []  Stephens November, Pharm.D. Rachel Rumbarger Pharm D Positive urine culture Treated with Cephalexin, organism sensitive to the same and no further patient follow-up is required at this time.  Genia Del 06/26/2016, 8:52 AM

## 2017-04-05 ENCOUNTER — Inpatient Hospital Stay (HOSPITAL_COMMUNITY): Payer: Self-pay | Admitting: Certified Registered Nurse Anesthetist

## 2017-04-05 ENCOUNTER — Inpatient Hospital Stay (HOSPITAL_COMMUNITY): Payer: Self-pay

## 2017-04-05 ENCOUNTER — Encounter (HOSPITAL_COMMUNITY)
Admission: EM | Disposition: A | Discharge status: Home / Self Care | Payer: Self-pay | Source: Home / Self Care | Attending: Internal Medicine

## 2017-04-05 ENCOUNTER — Inpatient Hospital Stay (HOSPITAL_COMMUNITY)
Admission: EM | Admit: 2017-04-05 | Discharge: 2017-04-06 | DRG: 690 | Disposition: A | Discharge status: Home / Self Care | Payer: Self-pay | Attending: Internal Medicine | Admitting: Internal Medicine

## 2017-04-05 ENCOUNTER — Emergency Department (HOSPITAL_COMMUNITY): Payer: Self-pay

## 2017-04-05 ENCOUNTER — Encounter (HOSPITAL_COMMUNITY): Payer: Self-pay | Admitting: Emergency Medicine

## 2017-04-05 DIAGNOSIS — N390 Urinary tract infection, site not specified: Secondary | ICD-10-CM

## 2017-04-05 DIAGNOSIS — Z885 Allergy status to narcotic agent status: Secondary | ICD-10-CM

## 2017-04-05 DIAGNOSIS — I10 Essential (primary) hypertension: Secondary | ICD-10-CM | POA: Insufficient documentation

## 2017-04-05 DIAGNOSIS — N201 Calculus of ureter: Secondary | ICD-10-CM

## 2017-04-05 DIAGNOSIS — N1330 Unspecified hydronephrosis: Secondary | ICD-10-CM

## 2017-04-05 DIAGNOSIS — F17210 Nicotine dependence, cigarettes, uncomplicated: Secondary | ICD-10-CM | POA: Diagnosis present

## 2017-04-05 DIAGNOSIS — Z880 Allergy status to penicillin: Secondary | ICD-10-CM

## 2017-04-05 DIAGNOSIS — Z87442 Personal history of urinary calculi: Secondary | ICD-10-CM

## 2017-04-05 DIAGNOSIS — N136 Pyonephrosis: Principal | ICD-10-CM | POA: Diagnosis present

## 2017-04-05 DIAGNOSIS — N200 Calculus of kidney: Secondary | ICD-10-CM

## 2017-04-05 DIAGNOSIS — E871 Hypo-osmolality and hyponatremia: Secondary | ICD-10-CM | POA: Diagnosis present

## 2017-04-05 DIAGNOSIS — R030 Elevated blood-pressure reading, without diagnosis of hypertension: Secondary | ICD-10-CM | POA: Diagnosis present

## 2017-04-05 DIAGNOSIS — Z87440 Personal history of urinary (tract) infections: Secondary | ICD-10-CM

## 2017-04-05 HISTORY — PX: CYSTOSCOPY W/ URETERAL STENT PLACEMENT: SHX1429

## 2017-04-05 HISTORY — DX: Essential (primary) hypertension: I10

## 2017-04-05 HISTORY — DX: Calculus of kidney: N20.0

## 2017-04-05 HISTORY — DX: Ataxia, unspecified: R27.0

## 2017-04-05 HISTORY — DX: Urinary tract infection, site not specified: N39.0

## 2017-04-05 LAB — COMPREHENSIVE METABOLIC PANEL
ALBUMIN: 3.8 g/dL (ref 3.5–5.0)
ALK PHOS: 56 U/L (ref 38–126)
ALT: 12 U/L — ABNORMAL LOW (ref 14–54)
AST: 14 U/L — AB (ref 15–41)
Anion gap: 10 (ref 5–15)
BILIRUBIN TOTAL: 0.6 mg/dL (ref 0.3–1.2)
BUN: 14 mg/dL (ref 6–20)
CALCIUM: 9.2 mg/dL (ref 8.9–10.3)
CO2: 22 mmol/L (ref 22–32)
CREATININE: 0.79 mg/dL (ref 0.44–1.00)
Chloride: 101 mmol/L (ref 101–111)
GFR calc Af Amer: >60 mL/min (ref >60)
GLUCOSE: 119 mg/dL — AB (ref 65–99)
POTASSIUM: 3.8 mmol/L (ref 3.5–5.1)
Sodium: 133 mmol/L — ABNORMAL LOW (ref 135–145)
TOTAL PROTEIN: 7.7 g/dL (ref 6.5–8.1)

## 2017-04-05 LAB — CBC WITH DIFFERENTIAL/PLATELET
BASOS ABS: 0 10*3/uL (ref 0.0–0.1)
BASOS PCT: 0 %
Eosinophils Absolute: 0 10*3/uL (ref 0.0–0.7)
Eosinophils Relative: 0 %
HEMATOCRIT: 37.6 % (ref 36.0–46.0)
Hemoglobin: 13.1 g/dL (ref 12.0–15.0)
Lymphocytes Relative: 11 %
Lymphs Abs: 1.6 10*3/uL (ref 0.7–4.0)
MCH: 34.3 pg — ABNORMAL HIGH (ref 26.0–34.0)
MCHC: 34.8 g/dL (ref 30.0–36.0)
MCV: 98.4 fL (ref 78.0–100.0)
MONOS PCT: 15 %
Monocytes Absolute: 2.1 10*3/uL — ABNORMAL HIGH (ref 0.1–1.0)
Neutro Abs: 10.7 10*3/uL — ABNORMAL HIGH (ref 1.7–7.7)
Neutrophils Relative %: 74 %
PLATELETS: 197 10*3/uL (ref 150–400)
RBC: 3.82 MIL/uL — ABNORMAL LOW (ref 3.87–5.11)
RDW: 12.4 % (ref 11.5–15.5)
WBC: 14.4 10*3/uL — ABNORMAL HIGH (ref 4.0–10.5)

## 2017-04-05 LAB — URINALYSIS, ROUTINE W REFLEX MICROSCOPIC
Bilirubin Urine: NEGATIVE
GLUCOSE, UA: NEGATIVE mg/dL
KETONES UR: NEGATIVE mg/dL
NITRITE: NEGATIVE
PH: 6 (ref 5.0–8.0)
Protein, ur: NEGATIVE mg/dL
SPECIFIC GRAVITY, URINE: 1.003 — AB (ref 1.005–1.030)

## 2017-04-05 LAB — LIPASE, BLOOD: LIPASE: 33 U/L (ref 11–51)

## 2017-04-05 SURGERY — CYSTOSCOPY, WITH RETROGRADE PYELOGRAM AND URETERAL STENT INSERTION
Anesthesia: General | Laterality: Right

## 2017-04-05 MED ORDER — CEFAZOLIN SODIUM-DEXTROSE 2-4 GM/100ML-% IV SOLN
INTRAVENOUS | Status: AC
  Filled 2017-04-05: qty 100

## 2017-04-05 MED ORDER — FENTANYL CITRATE (PF) 100 MCG/2ML IJ SOLN
INTRAMUSCULAR | Status: AC
  Filled 2017-04-05: qty 2

## 2017-04-05 MED ORDER — CIPROFLOXACIN IN D5W 400 MG/200ML IV SOLN
400.0000 mg | Freq: Two times a day (BID) | INTRAVENOUS | Status: DC
  Administered 2017-04-06: 03:00:00 400 mg via INTRAVENOUS
  Filled 2017-04-05 (×2): qty 200

## 2017-04-05 MED ORDER — MEPERIDINE HCL 50 MG/ML IJ SOLN: 6.2500 mg | INTRAMUSCULAR | Status: DC | PRN

## 2017-04-05 MED ORDER — SENNA 8.6 MG PO TABS
1.0000 | ORAL_TABLET | Freq: Two times a day (BID) | ORAL | Status: DC
  Administered 2017-04-05: 8.6 mg via ORAL
  Filled 2017-04-05 (×2): qty 1

## 2017-04-05 MED ORDER — ACETAMINOPHEN 650 MG RE SUPP
650.0000 mg | Freq: Four times a day (QID) | RECTAL | Status: DC | PRN
Start: 2017-04-05 — End: 2017-04-06

## 2017-04-05 MED ORDER — OXYCODONE HCL 5 MG/5ML PO SOLN
5.0000 mg | Freq: Once | ORAL | Status: DC | PRN
  Filled 2017-04-05: qty 5

## 2017-04-05 MED ORDER — PROPOFOL 10 MG/ML IV BOLUS
INTRAVENOUS | Status: AC
  Filled 2017-04-05: qty 20

## 2017-04-05 MED ORDER — ONDANSETRON HCL 4 MG/2ML IJ SOLN
INTRAMUSCULAR | Status: AC
  Filled 2017-04-05: qty 2

## 2017-04-05 MED ORDER — ONDANSETRON HCL 4 MG/2ML IJ SOLN
INTRAMUSCULAR | Status: DC | PRN
  Administered 2017-04-05: 4 mg via INTRAVENOUS

## 2017-04-05 MED ORDER — MIDAZOLAM HCL 5 MG/5ML IJ SOLN
INTRAMUSCULAR | Status: DC | PRN
  Administered 2017-04-05: 2 mg via INTRAVENOUS

## 2017-04-05 MED ORDER — LIDOCAINE 2% (20 MG/ML) 5 ML SYRINGE
INTRAMUSCULAR | Status: AC
Start: 2017-04-05 — End: 2017-04-05
  Filled 2017-04-05: qty 5

## 2017-04-05 MED ORDER — ONDANSETRON HCL 4 MG/2ML IJ SOLN: 4.0000 mg | Freq: Four times a day (QID) | INTRAMUSCULAR | Status: DC | PRN

## 2017-04-05 MED ORDER — SODIUM CHLORIDE 0.9 % IV BOLUS (SEPSIS)
1000.0000 mL | Freq: Once | INTRAVENOUS | Status: AC
  Administered 2017-04-05: 1000 mL via INTRAVENOUS

## 2017-04-05 MED ORDER — IOHEXOL 300 MG/ML  SOLN
INTRAMUSCULAR | Status: DC | PRN
  Administered 2017-04-05: 10 mL via URETHRAL

## 2017-04-05 MED ORDER — CEFAZOLIN SODIUM-DEXTROSE 2-3 GM-% IV SOLR
INTRAVENOUS | Status: DC | PRN
  Administered 2017-04-05: 2 g via INTRAVENOUS

## 2017-04-05 MED ORDER — HYDROMORPHONE HCL-NACL 0.5-0.9 MG/ML-% IV SOSY: 0.2500 mg | PREFILLED_SYRINGE | INTRAVENOUS | Status: DC | PRN

## 2017-04-05 MED ORDER — MORPHINE SULFATE (PF) 2 MG/ML IV SOLN
2.0000 mg | INTRAVENOUS | Status: DC | PRN
  Administered 2017-04-05 – 2017-04-06 (×3): 2 mg via INTRAVENOUS
  Filled 2017-04-05 (×3): qty 1

## 2017-04-05 MED ORDER — LIDOCAINE 2% (20 MG/ML) 5 ML SYRINGE
INTRAMUSCULAR | Status: DC | PRN
  Administered 2017-04-05: 80 mg via INTRAVENOUS

## 2017-04-05 MED ORDER — CIPROFLOXACIN IN D5W 400 MG/200ML IV SOLN
400.0000 mg | Freq: Once | INTRAVENOUS | Status: AC
  Administered 2017-04-05: 400 mg via INTRAVENOUS
  Filled 2017-04-05: qty 200

## 2017-04-05 MED ORDER — ONDANSETRON HCL 4 MG PO TABS: 4.0000 mg | ORAL_TABLET | Freq: Four times a day (QID) | ORAL | Status: DC | PRN

## 2017-04-05 MED ORDER — SODIUM CHLORIDE 0.9 % IV SOLN
INTRAVENOUS | Status: DC
Start: 2017-04-05 — End: 2017-04-06
  Administered 2017-04-05: 100 mL/h via INTRAVENOUS

## 2017-04-05 MED ORDER — PROPOFOL 10 MG/ML IV BOLUS
INTRAVENOUS | Status: DC | PRN
  Administered 2017-04-05: 160 mg via INTRAVENOUS

## 2017-04-05 MED ORDER — MIDAZOLAM HCL 2 MG/2ML IJ SOLN
INTRAMUSCULAR | Status: AC
  Filled 2017-04-05: qty 2

## 2017-04-05 MED ORDER — IBUPROFEN 400 MG PO TABS
400.0000 mg | ORAL_TABLET | Freq: Four times a day (QID) | ORAL | Status: DC | PRN
  Administered 2017-04-06: 400 mg via ORAL
  Filled 2017-04-05: qty 1

## 2017-04-05 MED ORDER — OXYCODONE HCL 5 MG PO TABS: 5.0000 mg | ORAL_TABLET | Freq: Once | ORAL | Status: DC | PRN

## 2017-04-05 MED ORDER — ONDANSETRON HCL 4 MG/2ML IJ SOLN
4.0000 mg | INTRAMUSCULAR | Status: DC | PRN
  Administered 2017-04-05: 4 mg via INTRAVENOUS
  Filled 2017-04-05: qty 2

## 2017-04-05 MED ORDER — PROMETHAZINE HCL 25 MG/ML IJ SOLN: 6.2500 mg | INTRAMUSCULAR | Status: DC | PRN

## 2017-04-05 MED ORDER — SODIUM CHLORIDE 0.9 % IV SOLN
INTRAVENOUS | Status: DC
  Administered 2017-04-05: 14:00:00 via INTRAVENOUS

## 2017-04-05 MED ORDER — ENOXAPARIN SODIUM 40 MG/0.4ML ~~LOC~~ SOLN: 40.0000 mg | SUBCUTANEOUS | Status: DC

## 2017-04-05 MED ORDER — MORPHINE SULFATE (PF) 4 MG/ML IV SOLN
4.0000 mg | INTRAVENOUS | Status: DC | PRN
  Administered 2017-04-05: 4 mg via INTRAVENOUS
  Filled 2017-04-05: qty 1

## 2017-04-05 MED ORDER — PHENYLEPHRINE 40 MCG/ML (10ML) SYRINGE FOR IV PUSH (FOR BLOOD PRESSURE SUPPORT)
PREFILLED_SYRINGE | INTRAVENOUS | Status: DC | PRN
  Administered 2017-04-05: 80 ug via INTRAVENOUS
  Administered 2017-04-05: 160 ug via INTRAVENOUS
  Administered 2017-04-05 (×2): 80 ug via INTRAVENOUS

## 2017-04-05 MED ORDER — LACTATED RINGERS IV SOLN
INTRAVENOUS | Status: DC | PRN
  Administered 2017-04-05: 20:00:00 via INTRAVENOUS

## 2017-04-05 MED ORDER — SODIUM CHLORIDE 0.9 % IR SOLN
Status: DC | PRN
  Administered 2017-04-05: 3000 mL

## 2017-04-05 MED ORDER — ACETAMINOPHEN 325 MG PO TABS: 650.0000 mg | ORAL_TABLET | Freq: Four times a day (QID) | ORAL | Status: DC | PRN

## 2017-04-05 MED ORDER — FENTANYL CITRATE (PF) 100 MCG/2ML IJ SOLN
INTRAMUSCULAR | Status: DC | PRN
  Administered 2017-04-05: 25 ug via INTRAVENOUS

## 2017-04-05 SURGICAL SUPPLY — 17 items
BAG URO CATCHER STRL LF (MISCELLANEOUS) ×3 IMPLANT
BASKET LASER NITINOL 1.9FR (BASKET) ×1 IMPLANT
BSKT STON RTRVL 120 1.9FR (BASKET)
CATH INTERMIT  6FR 70CM (CATHETERS) ×2 IMPLANT
CLOTH BEACON ORANGE TIMEOUT ST (SAFETY) ×3 IMPLANT
FIBER LASER FLEXIVA 365 (UROLOGICAL SUPPLIES) IMPLANT
FIBER LASER TRAC TIP (UROLOGICAL SUPPLIES) IMPLANT
GOWN STRL REUS W/TWL LRG LVL3 (GOWN DISPOSABLE) ×6 IMPLANT
GOWN STRL REUS W/TWL XL LVL3 (GOWN DISPOSABLE) IMPLANT
GUIDEWIRE ANG ZIPWIRE 038X150 (WIRE) ×1 IMPLANT
GUIDEWIRE STR DUAL SENSOR (WIRE) ×3 IMPLANT
INFUSOR MANOMETER BAG 3000ML (MISCELLANEOUS) IMPLANT
MANIFOLD NEPTUNE II (INSTRUMENTS) ×3 IMPLANT
PACK CYSTO (CUSTOM PROCEDURE TRAY) ×3 IMPLANT
STENT CONTOUR 6FRX24X.038 (STENTS) ×3 IMPLANT
SYRINGE 10CC LL (SYRINGE) ×1 IMPLANT
TUBE FEEDING 8FR 16IN STR KANG (MISCELLANEOUS) IMPLANT

## 2017-04-05 NOTE — Op Note (Signed)
Operative Note  Preoperative diagnosis:  1. Right Ureteral calculus with hydronephrosis and urinary tract infection  Postoperative diagnosis: 1. Right ureteral calculous with hydronephrosis and urinary tract infection  Procedure(s): 1. Cystoscopy with right retrograde pyelogram and right ureteral stent placement  Surgeon: Link Snuffer, MD  Assistants:none  Anesthesia: gen.  Complications: none immediate  EBL: none  Specimens: 1. Urine culture  Drains/Catheters: 1. 6 X 24 double-J ureteral  Intraoperative findings: cystoscopy revealed an inflamed bladder.Retrograde pyelogram revealed hydroureteronephrosis. There was purulent reflux after wire placement  Indication: he patient is a 51 year old female who presented with cyclical fevers and right flank pain. A CT scan revealed a large right renal calculusand a smaller ureteropelvic junction calculus on the right with hydronephrosis. The patient had leukocytosis. Given the concern for superimposed infection with a obstructing calculus, we elected to proceed with the above operation.  Description of procedure:  The patient was identified and consent was obtained. The patient was taken back to the operating room and placed in the supine position. She was placed under generanesthesia. She was converted to lithotomy and prepped and draped in a standard sterile  Fashion. A timeout was performed. A 21 French cystoscope was advanced into the urethra and into the bladder and a complete cystoscopy was  Performed with the findings noted above. An open-ended ureteral catheter was used to shoot a retrograde pyelogram. A wire was advanced into the kidney under fluoroscopic guidance and the right. A 6 x 24 double-J ureteral stent was then placed and the wire removed. Fluoroscopy confirmed good proximal placement. Direct visualization confirmed distal placement.  Plan:she will be observed on antibiotics. After a period of time on antibiotics she will  need to undergo a right ureteroscopy with laser lithotripsy and ureteral  Stent exchange.

## 2017-04-05 NOTE — Interval H&P Note (Signed)
History and Physical Interval Note:  04/05/2017 7:39 PM  Tara Bright  has presented today for surgery, with the diagnosis of right ureteral stone  The various methods of treatment have been discussed with the patient and family. After consideration of risks, benefits and other options for treatment, the patient has consented to  Procedure(s): CYSTOSCOPY WITH RETROGRADE PYELOGRAM/URETERAL STENT PLACEMENT (Right) as a surgical intervention .  The patient's history has been reviewed, patient examined, no change in status, stable for surgery.  I have reviewed the patient's chart and labs.  Questions were answered to the patient's satisfaction.     Marton Redwood, III

## 2017-04-05 NOTE — ED Notes (Signed)
Urology at bedside.

## 2017-04-05 NOTE — ED Notes (Signed)
6E made aware OR called and requesting patient at this time.

## 2017-04-05 NOTE — ED Notes (Signed)
20 minute timer started for 1606. Patient to be taken upstairs at 1845.

## 2017-04-05 NOTE — Anesthesia Preprocedure Evaluation (Signed)
Anesthesia Evaluation  Patient identified by MRN, date of birth, ID band Patient awake    Reviewed: Allergy & Precautions, NPO status , Patient's Chart, lab work & pertinent test results  Airway Mallampati: II  TM Distance: >3 FB Neck ROM: Full    Dental no notable dental hx.    Pulmonary Current Smoker,    Pulmonary exam normal breath sounds clear to auscultation       Cardiovascular hypertension, Pt. on medications Normal cardiovascular exam Rhythm:Regular Rate:Normal     Neuro/Psych negative neurological ROS  negative psych ROS   GI/Hepatic negative GI ROS, Neg liver ROS,   Endo/Other  negative endocrine ROS  Renal/GU Renal disease     Musculoskeletal negative musculoskeletal ROS (+)   Abdominal   Peds  Hematology negative hematology ROS (+)   Anesthesia Other Findings   Reproductive/Obstetrics negative OB ROS                             Anesthesia Physical Anesthesia Plan  ASA: II  Anesthesia Plan: General   Post-op Pain Management:    Induction: Intravenous  PONV Risk Score and Plan: 3 and Ondansetron, Dexamethasone and Midazolam  Airway Management Planned: LMA  Additional Equipment:   Intra-op Plan:   Post-operative Plan: Extubation in OR  Informed Consent: I have reviewed the patients History and Physical, chart, labs and discussed the procedure including the risks, benefits and alternatives for the proposed anesthesia with the patient or authorized representative who has indicated his/her understanding and acceptance.   Dental advisory given  Plan Discussed with: CRNA  Anesthesia Plan Comments:         Anesthesia Quick Evaluation

## 2017-04-05 NOTE — Anesthesia Procedure Notes (Signed)
Procedure Name: LMA Insertion Date/Time: 04/05/2017 7:48 PM Performed by: Maxwell Caul Pre-anesthesia Checklist: Patient identified, Emergency Drugs available, Suction available and Patient being monitored Patient Re-evaluated:Patient Re-evaluated prior to induction Oxygen Delivery Method: Circle system utilized Preoxygenation: Pre-oxygenation with 100% oxygen Induction Type: IV induction LMA: LMA inserted LMA Size: 4.0 Number of attempts: 1 Placement Confirmation: positive ETCO2 and breath sounds checked- equal and bilateral Tube secured with: Tape Dental Injury: Teeth and Oropharynx as per pre-operative assessment

## 2017-04-05 NOTE — H&P (View-Only) (Signed)
H&P  Chief Complaint: right ureteropelvic junction calculus, flank pain, fever  History of Present Illness: the patient is a 51 year old female who has had 3 days of flank pain and abdominal pain.  She also admits to cyclical fevers.  Her main complaint is her flank pain.  She also has some dysuria.  She presented to the emergency department and a CT scan revealed a 13 mm calculus in the lower poleand a 6 mm ureteropelvic junction stone with mild right hydronephrosis.  Her white blood cell count is 14.4.  Urinalysisrevealed positive leukocytes, many bacteria, and positive yeast.she has not eaten anything since last night.  She does have a history of recurrent   UTIs.  She also has a history of stones and has required lithotripsy in the past.  Past Medical History:  Diagnosis Date  . Ataxia 2016   and visual changes, left AMA before workup completed  . Hypertension   . Kidney stone   . UTI (urinary tract infection)    Past Surgical History:  Procedure Laterality Date  . LITHOTRIPSY      Home Medications:   (Not in a hospital admission) Allergies:  Allergies  Allergen Reactions  . Codeine Other (See Comments)    Gi upset  . Other Other (See Comments)    Just about all pain medications cause Nausea and Vomiting. Must take Phenergan before.   Marland Kitchen Penicillins Hives    Family History  Problem Relation Age of Onset  . Cancer Mother   . Diabetes Father   . Heart failure Father    Social History:  reports that she has been smoking Cigarettes.  She has been smoking about 0.50 packs per day. She has never used smokeless tobacco. She reports that she does not drink alcohol or use drugs.  ROS: A complete review of systems was performed.  All systems are negative except for pertinent findings as noted. ROS   Physical Exam:  Vital signs in last 24 hours: Temp:  [97.5 F (36.4 C)-97.9 F (36.6 C)] 97.9 F (36.6 C) (09/12 1449) Pulse Rate:  [74-90] 78 (09/12 1615) Resp:  [12-19] 14  (09/12 1615) BP: (100-117)/(77-86) 117/77 (09/12 1615) SpO2:  [96 %-99 %] 99 % (09/12 1615) Weight:  [57.6 kg (127 lb)] 57.6 kg (127 lb) (09/12 1021) General:  Alert and oriented, No acute distress HEENT: Normocephalic, atraumatic Neck: No JVD or lymphadenopathy Cardiovascular: adequate peripheral perfusion Lungs: nonlabored respirations, symmetrical chest rise Abdomen: Soft,mild tenderness to palpation in the r, right lower quadrant, nondistended, no abdominal masses Back: mild right CVA tenderness Extremities: No edema Neurologic: Grossly intact  Laboratory Data:  Results for orders placed or performed during the hospital encounter of 04/05/17 (from the past 24 hour(s))  Urinalysis, Routine w reflex microscopic- may I&O cath if menses     Status: Abnormal   Collection Time: 04/05/17 10:27 AM  Result Value Ref Range   Color, Urine YELLOW YELLOW   APPearance HAZY (A) CLEAR   Specific Gravity, Urine 1.003 (L) 1.005 - 1.030   pH 6.0 5.0 - 8.0   Glucose, UA NEGATIVE NEGATIVE mg/dL   Hgb urine dipstick LARGE (A) NEGATIVE   Bilirubin Urine NEGATIVE NEGATIVE   Ketones, ur NEGATIVE NEGATIVE mg/dL   Protein, ur NEGATIVE NEGATIVE mg/dL   Nitrite NEGATIVE NEGATIVE   Leukocytes, UA LARGE (A) NEGATIVE   RBC / HPF 0-5 0 - 5 RBC/hpf   WBC, UA TOO NUMEROUS TO COUNT 0 - 5 WBC/hpf   Bacteria, UA MANY (A)  NONE SEEN   Squamous Epithelial / LPF 0-5 (A) NONE SEEN   WBC Clumps PRESENT    Budding Yeast PRESENT   Comprehensive metabolic panel     Status: Abnormal   Collection Time: 04/05/17 10:54 AM  Result Value Ref Range   Sodium 133 (L) 135 - 145 mmol/L   Potassium 3.8 3.5 - 5.1 mmol/L   Chloride 101 101 - 111 mmol/L   CO2 22 22 - 32 mmol/L   Glucose, Bld 119 (H) 65 - 99 mg/dL   BUN 14 6 - 20 mg/dL   Creatinine, Ser 0.79 0.44 - 1.00 mg/dL   Calcium 9.2 8.9 - 10.3 mg/dL   Total Protein 7.7 6.5 - 8.1 g/dL   Albumin 3.8 3.5 - 5.0 g/dL   AST 14 (L) 15 - 41 U/L   ALT 12 (L) 14 - 54 U/L    Alkaline Phosphatase 56 38 - 126 U/L   Total Bilirubin 0.6 0.3 - 1.2 mg/dL   GFR calc non Af Amer >60 >60 mL/min   GFR calc Af Amer >60 >60 mL/min   Anion gap 10 5 - 15  Lipase, blood     Status: None   Collection Time: 04/05/17 10:54 AM  Result Value Ref Range   Lipase 33 11 - 51 U/L  CBC with Differential     Status: Abnormal   Collection Time: 04/05/17 10:54 AM  Result Value Ref Range   WBC 14.4 (H) 4.0 - 10.5 K/uL   RBC 3.82 (L) 3.87 - 5.11 MIL/uL   Hemoglobin 13.1 12.0 - 15.0 g/dL   HCT 37.6 36.0 - 46.0 %   MCV 98.4 78.0 - 100.0 fL   MCH 34.3 (H) 26.0 - 34.0 pg   MCHC 34.8 30.0 - 36.0 g/dL   RDW 12.4 11.5 - 15.5 %   Platelets 197 150 - 400 K/uL   Neutrophils Relative % 74 %   Neutro Abs 10.7 (H) 1.7 - 7.7 K/uL   Lymphocytes Relative 11 %   Lymphs Abs 1.6 0.7 - 4.0 K/uL   Monocytes Relative 15 %   Monocytes Absolute 2.1 (H) 0.1 - 1.0 K/uL   Eosinophils Relative 0 %   Eosinophils Absolute 0.0 0.0 - 0.7 K/uL   Basophils Relative 0 %   Basophils Absolute 0.0 0.0 - 0.1 K/uL   No results found for this or any previous visit (from the past 240 hour(s)). Creatinine:  Recent Labs  04/05/17 1054  CREATININE 0.79    Impression/Assessment:  Right ureteral calculus with right hydronephrosis and urinary tract infection.  Plan:  Plan for cystoscopy with right retrograde pyelogram and right ureteral stent placement.  She will be started on antibiotics.  I talked to her about the risk including not limited to bleeding, infection, injury to surrounding structures.  She elects to proceed.  Recommend starting antibiotics and IV fluids.  She may start a diet after the case.  Marton Redwood, III 04/05/2017, 5:14 PM

## 2017-04-05 NOTE — Consult Note (Signed)
H&P  Chief Complaint: right ureteropelvic junction calculus, flank pain, fever  History of Present Illness: the patient is a 51 year old female who has had 3 days of flank pain and abdominal pain.  She also admits to cyclical fevers.  Her main complaint is her flank pain.  She also has some dysuria.  She presented to the emergency department and a CT scan revealed a 13 mm calculus in the lower poleand a 6 mm ureteropelvic junction stone with mild right hydronephrosis.  Her white blood cell count is 14.4.  Urinalysisrevealed positive leukocytes, many bacteria, and positive yeast.she has not eaten anything since last night.  She does have a history of recurrent   UTIs.  She also has a history of stones and has required lithotripsy in the past.  Past Medical History:  Diagnosis Date  . Ataxia 2016   and visual changes, left AMA before workup completed  . Hypertension   . Kidney stone   . UTI (urinary tract infection)    Past Surgical History:  Procedure Laterality Date  . LITHOTRIPSY      Home Medications:   (Not in a hospital admission) Allergies:  Allergies  Allergen Reactions  . Codeine Other (See Comments)    Gi upset  . Other Other (See Comments)    Just about all pain medications cause Nausea and Vomiting. Must take Phenergan before.   Marland Kitchen Penicillins Hives    Family History  Problem Relation Age of Onset  . Cancer Mother   . Diabetes Father   . Heart failure Father    Social History:  reports that she has been smoking Cigarettes.  She has been smoking about 0.50 packs per day. She has never used smokeless tobacco. She reports that she does not drink alcohol or use drugs.  ROS: A complete review of systems was performed.  All systems are negative except for pertinent findings as noted. ROS   Physical Exam:  Vital signs in last 24 hours: Temp:  [97.5 F (36.4 C)-97.9 F (36.6 C)] 97.9 F (36.6 C) (09/12 1449) Pulse Rate:  [74-90] 78 (09/12 1615) Resp:  [12-19] 14  (09/12 1615) BP: (100-117)/(77-86) 117/77 (09/12 1615) SpO2:  [96 %-99 %] 99 % (09/12 1615) Weight:  [57.6 kg (127 lb)] 57.6 kg (127 lb) (09/12 1021) General:  Alert and oriented, No acute distress HEENT: Normocephalic, atraumatic Neck: No JVD or lymphadenopathy Cardiovascular: adequate peripheral perfusion Lungs: nonlabored respirations, symmetrical chest rise Abdomen: Soft,mild tenderness to palpation in the r, right lower quadrant, nondistended, no abdominal masses Back: mild right CVA tenderness Extremities: No edema Neurologic: Grossly intact  Laboratory Data:  Results for orders placed or performed during the hospital encounter of 04/05/17 (from the past 24 hour(s))  Urinalysis, Routine w reflex microscopic- may I&O cath if menses     Status: Abnormal   Collection Time: 04/05/17 10:27 AM  Result Value Ref Range   Color, Urine YELLOW YELLOW   APPearance HAZY (A) CLEAR   Specific Gravity, Urine 1.003 (L) 1.005 - 1.030   pH 6.0 5.0 - 8.0   Glucose, UA NEGATIVE NEGATIVE mg/dL   Hgb urine dipstick LARGE (A) NEGATIVE   Bilirubin Urine NEGATIVE NEGATIVE   Ketones, ur NEGATIVE NEGATIVE mg/dL   Protein, ur NEGATIVE NEGATIVE mg/dL   Nitrite NEGATIVE NEGATIVE   Leukocytes, UA LARGE (A) NEGATIVE   RBC / HPF 0-5 0 - 5 RBC/hpf   WBC, UA TOO NUMEROUS TO COUNT 0 - 5 WBC/hpf   Bacteria, UA MANY (A)  NONE SEEN   Squamous Epithelial / LPF 0-5 (A) NONE SEEN   WBC Clumps PRESENT    Budding Yeast PRESENT   Comprehensive metabolic panel     Status: Abnormal   Collection Time: 04/05/17 10:54 AM  Result Value Ref Range   Sodium 133 (L) 135 - 145 mmol/L   Potassium 3.8 3.5 - 5.1 mmol/L   Chloride 101 101 - 111 mmol/L   CO2 22 22 - 32 mmol/L   Glucose, Bld 119 (H) 65 - 99 mg/dL   BUN 14 6 - 20 mg/dL   Creatinine, Ser 0.79 0.44 - 1.00 mg/dL   Calcium 9.2 8.9 - 10.3 mg/dL   Total Protein 7.7 6.5 - 8.1 g/dL   Albumin 3.8 3.5 - 5.0 g/dL   AST 14 (L) 15 - 41 U/L   ALT 12 (L) 14 - 54 U/L    Alkaline Phosphatase 56 38 - 126 U/L   Total Bilirubin 0.6 0.3 - 1.2 mg/dL   GFR calc non Af Amer >60 >60 mL/min   GFR calc Af Amer >60 >60 mL/min   Anion gap 10 5 - 15  Lipase, blood     Status: None   Collection Time: 04/05/17 10:54 AM  Result Value Ref Range   Lipase 33 11 - 51 U/L  CBC with Differential     Status: Abnormal   Collection Time: 04/05/17 10:54 AM  Result Value Ref Range   WBC 14.4 (H) 4.0 - 10.5 K/uL   RBC 3.82 (L) 3.87 - 5.11 MIL/uL   Hemoglobin 13.1 12.0 - 15.0 g/dL   HCT 37.6 36.0 - 46.0 %   MCV 98.4 78.0 - 100.0 fL   MCH 34.3 (H) 26.0 - 34.0 pg   MCHC 34.8 30.0 - 36.0 g/dL   RDW 12.4 11.5 - 15.5 %   Platelets 197 150 - 400 K/uL   Neutrophils Relative % 74 %   Neutro Abs 10.7 (H) 1.7 - 7.7 K/uL   Lymphocytes Relative 11 %   Lymphs Abs 1.6 0.7 - 4.0 K/uL   Monocytes Relative 15 %   Monocytes Absolute 2.1 (H) 0.1 - 1.0 K/uL   Eosinophils Relative 0 %   Eosinophils Absolute 0.0 0.0 - 0.7 K/uL   Basophils Relative 0 %   Basophils Absolute 0.0 0.0 - 0.1 K/uL   No results found for this or any previous visit (from the past 240 hour(s)). Creatinine:  Recent Labs  04/05/17 1054  CREATININE 0.79    Impression/Assessment:  Right ureteral calculus with right hydronephrosis and urinary tract infection.  Plan:  Plan for cystoscopy with right retrograde pyelogram and right ureteral stent placement.  She will be started on antibiotics.  I talked to her about the risk including not limited to bleeding, infection, injury to surrounding structures.  She elects to proceed.  Recommend starting antibiotics and IV fluids.  She may start a diet after the case.  Marton Redwood, III 04/05/2017, 5:14 PM

## 2017-04-05 NOTE — Anesthesia Procedure Notes (Signed)
Date/Time: 04/05/2017 8:18 PM Performed by: Cynda Familia Pre-anesthesia Checklist: Patient identified, Patient being monitored, Suction available and Emergency Drugs available Oxygen Delivery Method: Simple face mask Airway Equipment and Method: Oral airway Placement Confirmation: positive ETCO2 and breath sounds checked- equal and bilateral Dental Injury: Teeth and Oropharynx as per pre-operative assessment  Comments: Extubated to face mask--- OA placed good AW to PACU O2 intact

## 2017-04-05 NOTE — ED Provider Notes (Signed)
Pt transferred here from [facility] due to kidney stones plus UTI.  Pt given IV cipro, morphine, zofran, and IVFs.  Dr. Thurnell Garbe discussed pt with Dr. Gloriann Loan (urology) who recommended transfer to [facility].  Pt is reexamined and remains stable.  She does not require any further pain medicine now.  I called Dr. Gloriann Loan who will come to see pt.  He requests the hospitalists to admit due to her uti.  He will take her to the OR for a stent placement.  Dr. Tyrell Antonio ([facility]) will admit.   Isla Pence, MD 04/05/17 [phone]

## 2017-04-05 NOTE — Transfer of Care (Signed)
Immediate Anesthesia Transfer of Care Note  Patient: Tara Bright  Procedure(s) Performed: Procedure(s): CYSTOSCOPY WITH RETROGRADE PYELOGRAM/URETERAL STENT PLACEMENT (Right)  Patient Location: PACU  Anesthesia Type:General  Level of Consciousness: sedated  Airway & Oxygen Therapy: Patient Spontanous Breathing and Patient connected to face mask oxygen  Post-op Assessment: Report given to RN and Post -op Vital signs reviewed and stable  Post vital signs: Reviewed and stable  Last Vitals:  Vitals:   04/05/17 1830 04/05/17 2027  BP: 131/80 116/74  Pulse: 75   Resp: 14 14  Temp:  36.8 C  SpO2: 97%     Last Pain:  Vitals:   04/05/17 2027  TempSrc:   PainSc: 0-No pain         Complications: no apparent anesthesia complications

## 2017-04-05 NOTE — H&P (Signed)
History and Physical    Tara Bright Tara Bright:[id number] DOB: 04/22/1966 DOA: 04/05/2017  PCP: [facility] Patient coming from: Home   I have personally briefly reviewed patient's old medical records in [place]  Chief Complaint: right side flank pain./   HPI: Tara Bright is a 51 y.o. female with medical history significant of recurrent UTI, Kidney stone, who presents complaining of dysuria, supra pubic, and right side flank and inguinal area pain that started 3 days prior to admission. She also report suggestive fever.  She report nausea, no vomiting.  She relates she has hight BP when she gets pain and problems with kidney stones. She is not currently taking any medications for BP.   ED Course: WBC at 14, Sodium 133, CT renal stone protocol; Right nephrolithiasis is noted with 13 mm calculus seen in lower pole. 6 mm right renal pelvic calculus is noted, with mild right hydronephrosis. Minimal ureteral dilatation is noted, but no ureteral calculus is noted. Potentially this may be due to distal ureteral spasm from recently passed stone.  Review of Systems: As per HPI otherwise 10 point review of systems negative.    Past Medical History:  Diagnosis Date  . Ataxia 2016   and visual changes, left AMA before workup completed  . Hypertension   . Kidney stone   . UTI (urinary tract infection)     Past Surgical History:  Procedure Laterality Date  . LITHOTRIPSY       reports that she has been smoking Cigarettes.  She has been smoking about 0.50 packs per day. She has never used smokeless tobacco. She reports that she does not drink alcohol or use drugs.  Allergies  Allergen Reactions  . Codeine Other (See Comments)    Gi upset  . Other Other (See Comments)    Just about all pain medications cause Nausea and Vomiting. Must take Phenergan before.   Marland Kitchen Penicillins Hives    Family History  Problem Relation Age of Onset  . Cancer Mother   . Diabetes Father    . Heart failure Father      Prior to Admission medications   Medication Sig Start Date End Date Taking? Authorizing Provider  acetaminophen (TYLENOL) 500 MG tablet Take 1,000 mg by mouth every 4 (four) hours as needed.   Yes [provider]  AZO-CRANBERRY PO Take 2 tablets by mouth daily as needed (pain).   Yes [provider]    Physical Exam: Vitals:   04/05/17 1400 04/05/17 1402 04/05/17 1449 04/05/17 1615  BP: 116/78 116/78 113/78 117/77  Pulse: 78 74 79 78  Resp: 15 17 12 14   Temp:   97.9 F (36.6 C)   TempSrc:   Oral   SpO2: 97% 99% 96% 99%  Weight:      Height:        Constitutional: NAD, calm, comfortable Vitals:   04/05/17 1400 04/05/17 1402 04/05/17 1449 04/05/17 1615  BP: 116/78 116/78 113/78 117/77  Pulse: 78 74 79 78  Resp: 15 17 12 14   Temp:   97.9 F (36.6 C)   TempSrc:   Oral   SpO2: 97% 99% 96% 99%  Weight:      Height:       Eyes: PERRL, lids and conjunctivae normal ENMT: Mucous membranes are moist. Posterior pharynx clear of any exudate or lesions.Normal dentition.  Neck: normal, supple, no masses, no thyromegaly Respiratory: clear to auscultation bilaterally, no wheezing, no crackles. Normal respiratory effort. No  accessory muscle use.  Cardiovascular: Regular rate and rhythm, no murmurs / rubs / gallops. No extremity edema. 2+ pedal pulses. No carotid bruits.  Abdomen: supra pubic  tenderness, and flank tenderness. no masses palpated. No hepatosplenomegaly. Bowel sounds positive.  Musculoskeletal: no clubbing / cyanosis. No joint deformity upper and lower extremities. Good ROM, no contractures. Normal muscle tone.  Skin: no rashes, lesions, ulcers. No induration Neurologic: CN 2-12 grossly intact. Sensation intact, DTR normal. Strength 5/5 in all 4.  Psychiatric: Normal judgment and insight. Alert and oriented x 3. Normal mood.     Labs on Admission: I have personally reviewed following labs and imaging  studies  CBC:  Recent Labs Lab 04/05/17 1054  WBC 14.4*  NEUTROABS 10.7*  HGB 13.1  HCT 37.6  MCV 98.4  PLT 580   Basic Metabolic Panel:  Recent Labs Lab 04/05/17 1054  NA 133*  K 3.8  CL 101  CO2 22  GLUCOSE 119*  BUN 14  CREATININE 0.79  CALCIUM 9.2   GFR: Estimated Creatinine Clearance: 68.8 mL/min (by C-G formula based on SCr of 0.79 mg/dL). Liver Function Tests:  Recent Labs Lab 04/05/17 1054  AST 14*  ALT 12*  ALKPHOS 56  BILITOT 0.6  PROT 7.7  ALBUMIN 3.8    Recent Labs Lab 04/05/17 1054  LIPASE 33   No results for input(s): AMMONIA in the last 168 hours. Coagulation Profile: No results for input(s): INR, PROTIME in the last 168 hours. Cardiac Enzymes: No results for input(s): CKTOTAL, CKMB, CKMBINDEX, TROPONINI in the last 168 hours. BNP (last 3 results) No results for input(s): PROBNP in the last 8760 hours. HbA1C: No results for input(s): HGBA1C in the last 72 hours. CBG: No results for input(s): GLUCAP in the last 168 hours. Lipid Profile: No results for input(s): CHOL, HDL, LDLCALC, TRIG, CHOLHDL, LDLDIRECT in the last 72 hours. Thyroid Function Tests: No results for input(s): TSH, T4TOTAL, FREET4, T3FREE, THYROIDAB in the last 72 hours. Anemia Panel: No results for input(s): VITAMINB12, FOLATE, FERRITIN, TIBC, IRON, RETICCTPCT in the last 72 hours. Urine analysis:    Component Value Date/Time   COLORURINE YELLOW 04/05/2017 1027   APPEARANCEUR HAZY (A) 04/05/2017 1027   LABSPEC 1.003 (L) 04/05/2017 1027   PHURINE 6.0 04/05/2017 1027   GLUCOSEU NEGATIVE 04/05/2017 1027   HGBUR LARGE (A) 04/05/2017 1027   BILIRUBINUR NEGATIVE 04/05/2017 1027   KETONESUR NEGATIVE 04/05/2017 1027   PROTEINUR NEGATIVE 04/05/2017 1027   UROBILINOGEN 0.2 09/21/2014 1405   NITRITE NEGATIVE 04/05/2017 1027   LEUKOCYTESUR LARGE (A) 04/05/2017 1027    Radiological Exams on Admission: Ct Renal Stone Study  Result Date: 04/05/2017 CLINICAL DATA:   Acute right flank pain. EXAM: CT ABDOMEN AND PELVIS WITHOUT CONTRAST TECHNIQUE: Multidetector CT imaging of the abdomen and pelvis was performed following the standard protocol without IV contrast. COMPARISON:  CT scan of April 03, 2012. FINDINGS: Lower chest: No acute abnormality. Hepatobiliary: No gallstones are noted. Fatty infiltration of the liver is noted. Pancreas: Unremarkable. No pancreatic ductal dilatation or surrounding inflammatory changes. Spleen: Normal in size without focal abnormality. Adrenals/Urinary Tract: Adrenal glands appear normal. Right nephrolithiasis is noted with 13 mm calculus seen in lower pole calyx. 6 mm pelvic calculus is noted. Mild right hydronephrosis is noted with minimal ureteral dilatation. However no ureteral calculus is noted. Urinary bladder is unremarkable. Stomach/Bowel: Stomach is within normal limits. Appendix appears normal. No evidence of bowel wall thickening, distention, or inflammatory changes. Vascular/Lymphatic: Aortic atherosclerosis. No enlarged abdominal  or pelvic lymph nodes. Reproductive: Uterus and bilateral adnexa are unremarkable. Other: No abdominal wall hernia or abnormality. No abdominopelvic ascites. Musculoskeletal: No acute or significant osseous findings. IMPRESSION: Fatty infiltration of the liver. Aortic atherosclerosis. Right nephrolithiasis is noted with 13 mm calculus seen in lower pole. 6 mm right renal pelvic calculus is noted, with mild right hydronephrosis. Minimal ureteral dilatation is noted, but no ureteral calculus is noted. Potentially this may be due to distal ureteral spasm from recently passed stone. Electronically Signed   By: Marijo Conception, M.D.   On: 04/05/2017 12:40    EKG: none available.   Assessment/Plan Active Problems:   UTI (urinary tract infection)   Hyponatremia   Kidney stone   Hydronephrosis   Elevated blood pressure reading  1-Pyelonephritis, kidney stone, hydronephrosis;  Evaluated by urology who  is planning stent placement tonight.  Continue with IV fluids.  IV morphine PRN for Pain.  IV Ciprofloxacin.  Follow urine culture.   2-History of elevated BP; per patient she only has problem with High BP when she has problems with kidney stone.  Could be related to pain.  Her BP is stable.  If needed , could use PRN hydralazine if her BP increases.   3-Leukocytosis. In setting number one.  Iv antibiotics.   4-Mild Hyponatremia; IV fluids.    DVT prophylaxis: Lovenox to start tomorrow.  Code Status:  Full code.  Family Communication: care discussed with patient  Disposition Plan: home when stable. Per urology  Consults called: urology  Admission status: inpatient,.med-surgery    Elmarie Shiley MD [facility] Pager [phone]  If 7PM-7AM, please contact night-coverage [email or web address] Password [facility]  04/05/2017, 5:40 PM

## 2017-04-05 NOTE — ED Triage Notes (Signed)
Pt reports right flank pain,nausea,lost of appetite for last several days. Pt reports increased urinary frequency and "hemorrhoids" due to constipation x3 days. cbg en route 144

## 2017-04-05 NOTE — ED Provider Notes (Signed)
[facility] DEPT Provider Note   CSN: [id number] Arrival date & time: 04/05/17  1017     History   Chief Complaint Chief Complaint  Patient presents with  . Flank Pain    HPI Tara Bright is a 51 y.o. female.  HPI Pt was seen at 1045. Per pt, c/o gradual onset and persistence of constant right sided flank "pain" for the past 2 days. Has been associated with nausea, constipation, and urinary frequency.  Denies vomiting, no fevers, no rash, no hematuria, no CP/SOB, no abd pain, no vaginal bleeding/discharge.    Past Medical History:  Diagnosis Date  . Ataxia 2016   and visual changes, left AMA before workup completed  . Hypertension   . Kidney stone   . UTI (urinary tract infection)     There are no active problems to display for this patient.   Past Surgical History:  Procedure Laterality Date  . LITHOTRIPSY      OB History    Gravida Para Term Preterm AB Living   5 2 2   3      SAB TAB Ectopic Multiple Live Births   3               Home Medications    Prior to Admission medications   Medication Sig Start Date End Date Taking? Authorizing Provider  acetaminophen (TYLENOL) 500 MG tablet Take 1,000 mg by mouth every 4 (four) hours as needed.   Yes [provider]  AZO-CRANBERRY PO Take 2 tablets by mouth daily as needed (pain).   Yes [provider]    Family History Family History  Problem Relation Age of Onset  . Cancer Mother   . Diabetes Father   . Heart failure Father     Social History Social History  Substance Use Topics  . Smoking status: Current Some Day Smoker    Packs/day: 0.50    Types: Cigarettes  . Smokeless tobacco: Never Used  . Alcohol use No     Allergies   Codeine; Other; and Penicillins   Review of Systems Review of Systems ROS: Statement: All systems negative except as marked or noted in the HPI; Constitutional: Negative for fever and chills. ; ; Eyes: Negative for eye pain, redness and discharge.  ; ; ENMT: Negative for ear pain, hoarseness, nasal congestion, sinus pressure and sore throat. ; ; Cardiovascular: Negative for chest pain, palpitations, diaphoresis, dyspnea and peripheral edema. ; ; Respiratory: Negative for cough, wheezing and stridor. ; ; Gastrointestinal: +nausea, constipation.  Negative for vomiting, diarrhea, abdominal pain, blood in stool, hematemesis, jaundice and rectal bleeding. . ; ; Genitourinary: +flank pain, urinary frequency. Negative for hematuria. ; ; GYN:  No pelvic pain, no vaginal bleeding, no vaginal discharge, no vulvar pain. ;; Musculoskeletal: Negative for back pain and neck pain. Negative for swelling and trauma.; ; Skin: Negative for pruritus, rash, abrasions, blisters, bruising and skin lesion.; ; Neuro: Negative for headache, lightheadedness and neck stiffness. Negative for weakness, altered level of consciousness, altered mental status, extremity weakness, paresthesias, involuntary movement, seizure and syncope.       Physical Exam Updated Vital Signs BP 116/78 (BP Location: Right Arm)   Pulse 74   Temp (!) 97.5 F (36.4 C) (Oral)   Resp 17   Ht 5\' 3"  (1.6 m)   Wt 57.6 kg (127 lb)   SpO2 99%   BMI 22.50 kg/m   Physical Exam 1050: Physical examination:  Nursing notes reviewed; Vital signs and O2  SAT reviewed;  Constitutional: Well developed, Well nourished, Well hydrated, In no acute distress; Head:  Normocephalic, atraumatic; Eyes: EOMI, PERRL, No scleral icterus; ENMT: Mouth and pharynx normal, Mucous membranes moist; Neck: Supple, Full range of motion, No lymphadenopathy; Cardiovascular: Regular rate and rhythm, No gallop; Respiratory: Breath sounds clear & equal bilaterally, No wheezes.  Speaking full sentences with ease, Normal respiratory effort/excursion; Chest: Nontender, Movement normal; Abdomen: Soft, +suprapubic tenderness to palp. No rebound or guarding. Nondistended, Normal bowel sounds; Genitourinary: +right CVA tenderness; Spine:  No  midline CS, TS, LS tenderness. +TTP right lumbar paraspinal muscles.;; Extremities: Pulses normal, No tenderness, No edema, No calf edema or asymmetry.; Neuro: AA&Ox3, Major CN grossly intact.  Speech clear. No gross focal motor or sensory deficits in extremities.; Skin: Color normal, Warm, Dry.   ED Treatments / Results  Labs (all labs ordered are listed, but only abnormal results are displayed)   EKG  EKG Interpretation None       Radiology   Procedures Procedures (including critical care time)  Medications Ordered in ED Medications  morphine 4 MG/ML injection 4 mg (4 mg Intravenous Given 04/05/17 1128)  ondansetron (ZOFRAN) injection 4 mg (4 mg Intravenous Given 04/05/17 1127)     Initial Impression / Assessment and Plan / ED Course  I have reviewed the triage vital signs and the nursing notes.  Pertinent labs & imaging results that were available during my care of the patient were reviewed by me and considered in my medical decision making (see chart for details).  MDM Reviewed: previous chart, nursing note and vitals Reviewed previous: labs Interpretation: labs and CT scan   Results for orders placed or performed during the hospital encounter of 04/05/17  Urinalysis, Routine w reflex microscopic- may I&O cath if menses  Result Value Ref Range   Color, Urine YELLOW YELLOW   APPearance HAZY (A) CLEAR   Specific Gravity, Urine 1.003 (L) 1.005 - 1.030   pH 6.0 5.0 - 8.0   Glucose, UA NEGATIVE NEGATIVE mg/dL   Hgb urine dipstick LARGE (A) NEGATIVE   Bilirubin Urine NEGATIVE NEGATIVE   Ketones, ur NEGATIVE NEGATIVE mg/dL   Protein, ur NEGATIVE NEGATIVE mg/dL   Nitrite NEGATIVE NEGATIVE   Leukocytes, UA LARGE (A) NEGATIVE   RBC / HPF 0-5 0 - 5 RBC/hpf   WBC, UA TOO NUMEROUS TO COUNT 0 - 5 WBC/hpf   Bacteria, UA MANY (A) NONE SEEN   Squamous Epithelial / LPF 0-5 (A) NONE SEEN   WBC Clumps PRESENT    Budding Yeast PRESENT   Comprehensive metabolic panel  Result  Value Ref Range   Sodium 133 (L) 135 - 145 mmol/L   Potassium 3.8 3.5 - 5.1 mmol/L   Chloride 101 101 - 111 mmol/L   CO2 22 22 - 32 mmol/L   Glucose, Bld 119 (H) 65 - 99 mg/dL   BUN 14 6 - 20 mg/dL   Creatinine, Ser 0.79 0.44 - 1.00 mg/dL   Calcium 9.2 8.9 - 10.3 mg/dL   Total Protein 7.7 6.5 - 8.1 g/dL   Albumin 3.8 3.5 - 5.0 g/dL   AST 14 (L) 15 - 41 U/L   ALT 12 (L) 14 - 54 U/L   Alkaline Phosphatase 56 38 - 126 U/L   Total Bilirubin 0.6 0.3 - 1.2 mg/dL   GFR calc non Af Amer >60 >60 mL/min   GFR calc Af Amer >60 >60 mL/min   Anion gap 10 5 - 15  Lipase, blood  Result Value Ref Range   Lipase 33 11 - 51 U/L  CBC with Differential  Result Value Ref Range   WBC 14.4 (H) 4.0 - 10.5 K/uL   RBC 3.82 (L) 3.87 - 5.11 MIL/uL   Hemoglobin 13.1 12.0 - 15.0 g/dL   HCT 37.6 36.0 - 46.0 %   MCV 98.4 78.0 - 100.0 fL   MCH 34.3 (H) 26.0 - 34.0 pg   MCHC 34.8 30.0 - 36.0 g/dL   RDW 12.4 11.5 - 15.5 %   Platelets 197 150 - 400 K/uL   Neutrophils Relative % 74 %   Neutro Abs 10.7 (H) 1.7 - 7.7 K/uL   Lymphocytes Relative 11 %   Lymphs Abs 1.6 0.7 - 4.0 K/uL   Monocytes Relative 15 %   Monocytes Absolute 2.1 (H) 0.1 - 1.0 K/uL   Eosinophils Relative 0 %   Eosinophils Absolute 0.0 0.0 - 0.7 K/uL   Basophils Relative 0 %   Basophils Absolute 0.0 0.0 - 0.1 K/uL   Ct Renal Stone Study Result Date: 04/05/2017 CLINICAL DATA:  Acute right flank pain. EXAM: CT ABDOMEN AND PELVIS WITHOUT CONTRAST TECHNIQUE: Multidetector CT imaging of the abdomen and pelvis was performed following the standard protocol without IV contrast. COMPARISON:  CT scan of April 03, 2012. FINDINGS: Lower chest: No acute abnormality. Hepatobiliary: No gallstones are noted. Fatty infiltration of the liver is noted. Pancreas: Unremarkable. No pancreatic ductal dilatation or surrounding inflammatory changes. Spleen: Normal in size without focal abnormality. Adrenals/Urinary Tract: Adrenal glands appear normal. Right  nephrolithiasis is noted with 13 mm calculus seen in lower pole calyx. 6 mm pelvic calculus is noted. Mild right hydronephrosis is noted with minimal ureteral dilatation. However no ureteral calculus is noted. Urinary bladder is unremarkable. Stomach/Bowel: Stomach is within normal limits. Appendix appears normal. No evidence of bowel wall thickening, distention, or inflammatory changes. Vascular/Lymphatic: Aortic atherosclerosis. No enlarged abdominal or pelvic lymph nodes. Reproductive: Uterus and bilateral adnexa are unremarkable. Other: No abdominal wall hernia or abnormality. No abdominopelvic ascites. Musculoskeletal: No acute or significant osseous findings. IMPRESSION: Fatty infiltration of the liver. Aortic atherosclerosis. Right nephrolithiasis is noted with 13 mm calculus seen in lower pole. 6 mm right renal pelvic calculus is noted, with mild right hydronephrosis. Minimal ureteral dilatation is noted, but no ureteral calculus is noted. Potentially this may be due to distal ureteral spasm from recently passed stone. Electronically Signed   By: Marijo Conception, M.D.   On: 04/05/2017 12:40    1340:  +UTI, UC pending; will dose IV cipro. WBC count elevated, but no fever while in the ED. Concern regarding UTI + several stones.   T/C to Uro Dr. Gloriann Loan, case discussed, including:  HPI, pertinent PM/SHx, VS/PE, dx testing, ED course and treatment:  States pt will need stent, requests to transfer to [facility] ED and he will consult for stent placement. Dx and testing, as well as d/w Uro MD, d/w pt.  Questions answered.  Verb understanding, agreeable to transfer to [facility] ED for Uro MD eval. [facility] EDP Dr. Jeneen Rinks called with report.      Final Clinical Impressions(s) / ED Diagnoses   Final diagnoses:  None    New Prescriptions New Prescriptions   No medications on file     Francine Graven, DO 04/09/17 1819

## 2017-04-06 ENCOUNTER — Encounter (HOSPITAL_COMMUNITY): Payer: Self-pay | Admitting: Urology

## 2017-04-06 DIAGNOSIS — N1330 Unspecified hydronephrosis: Secondary | ICD-10-CM

## 2017-04-06 DIAGNOSIS — N201 Calculus of ureter: Secondary | ICD-10-CM

## 2017-04-06 DIAGNOSIS — N200 Calculus of kidney: Secondary | ICD-10-CM

## 2017-04-06 DIAGNOSIS — E871 Hypo-osmolality and hyponatremia: Secondary | ICD-10-CM

## 2017-04-06 DIAGNOSIS — R030 Elevated blood-pressure reading, without diagnosis of hypertension: Secondary | ICD-10-CM

## 2017-04-06 LAB — BASIC METABOLIC PANEL
Anion gap: 7 (ref 5–15)
BUN: 12 mg/dL (ref 6–20)
CO2: 24 mmol/L (ref 22–32)
Calcium: 8.7 mg/dL — ABNORMAL LOW (ref 8.9–10.3)
Chloride: 107 mmol/L (ref 101–111)
Creatinine, Ser: 0.6 mg/dL (ref 0.44–1.00)
GFR calc Af Amer: >60 mL/min (ref >60)
Glucose, Bld: 88 mg/dL (ref 65–99)
Potassium: 3.8 mmol/L (ref 3.5–5.1)
Sodium: 138 mmol/L (ref 135–145)

## 2017-04-06 LAB — CBC
HEMATOCRIT: 31.1 % — AB (ref 36.0–46.0)
Hemoglobin: 10.6 g/dL — ABNORMAL LOW (ref 12.0–15.0)
MCH: 33.9 pg (ref 26.0–34.0)
MCHC: 34.1 g/dL (ref 30.0–36.0)
MCV: 99.4 fL (ref 78.0–100.0)
PLATELETS: 171 10*3/uL (ref 150–400)
RBC: 3.13 MIL/uL — ABNORMAL LOW (ref 3.87–5.11)
RDW: 12.7 % (ref 11.5–15.5)
WBC: 9.1 10*3/uL (ref 4.0–10.5)

## 2017-04-06 LAB — HIV ANTIBODY (ROUTINE TESTING W REFLEX): HIV SCREEN 4TH GENERATION: NONREACTIVE

## 2017-04-06 MED ORDER — CIPROFLOXACIN HCL 500 MG PO TABS
500.0000 mg | ORAL_TABLET | Freq: Two times a day (BID) | ORAL | Status: DC
  Administered 2017-04-06: 500 mg via ORAL
  Filled 2017-04-06: qty 1

## 2017-04-06 MED ORDER — OXYBUTYNIN CHLORIDE ER 10 MG PO TB24: 8.0000 mg | ORAL_TABLET | Freq: Three times a day (TID) | ORAL | 0 refills | Status: DC | PRN

## 2017-04-06 MED ORDER — CIPROFLOXACIN HCL 500 MG PO TABS
500.0000 mg | ORAL_TABLET | Freq: Two times a day (BID) | ORAL | 0 refills | Status: DC
Start: 2017-04-06 — End: 2017-04-06

## 2017-04-06 MED ORDER — TAMSULOSIN HCL 0.4 MG PO CAPS: 0.4000 mg | ORAL_CAPSULE | Freq: Every day | ORAL | 0 refills | Status: DC

## 2017-04-06 MED ORDER — IBUPROFEN 400 MG PO TABS: 400.0000 mg | ORAL_TABLET | Freq: Four times a day (QID) | ORAL | 0 refills | Status: DC | PRN

## 2017-04-06 MED ORDER — CIPROFLOXACIN HCL 500 MG PO TABS: 500.0000 mg | ORAL_TABLET | Freq: Two times a day (BID) | ORAL | 0 refills | Status: DC

## 2017-04-06 NOTE — Progress Notes (Signed)
Urology Inpatient Progress Report  Hydronephrosis of right kidney [N13.30] Ureteral calculus, right [N20.1] Urinary tract infection without hematuria, site unspecified [N39.0]  Procedure(s): CYSTOSCOPY WITH RETROGRADE PYELOGRAM/URETERAL STENT PLACEMENT  1 Day Post-Op   Intv/Subj: No acute events overnight. Patient is without complaint except for some urinary frequency.  She was afebrile.  She has no leukocytosis and creatinine is normal.  She is on ciprofloxacin.  Active Problems:   UTI (urinary tract infection)   Hyponatremia   Kidney stone   Hydronephrosis   Elevated blood pressure reading  Current Facility-Administered Medications  Medication Dose Route Frequency Provider Last Rate Last Dose  . 0.9 %  sodium chloride infusion   Intravenous Continuous Regalado, Belkys A, MD 100 mL/hr at 04/05/17 2254 100 mL/hr at 04/05/17 2254  . acetaminophen (TYLENOL) tablet 650 mg  650 mg Oral Q6H PRN Regalado, Belkys A, MD       Or  . acetaminophen (TYLENOL) suppository 650 mg  650 mg Rectal Q6H PRN Regalado, Belkys A, MD      . ciprofloxacin (CIPRO) IVPB 400 mg  400 mg Intravenous Q12H Regalado, Belkys A, MD   Stopped at 04/06/17 0335  . enoxaparin (LOVENOX) injection 40 mg  40 mg Subcutaneous Q24H Regalado, Belkys A, MD      . ibuprofen (ADVIL,MOTRIN) tablet 400 mg  400 mg Oral Q6H PRN Regalado, Belkys A, MD      . morphine 2 MG/ML injection 2 mg  2 mg Intravenous Q3H PRN Regalado, Belkys A, MD   2 mg at 04/06/17 0236  . ondansetron (ZOFRAN) tablet 4 mg  4 mg Oral Q6H PRN Regalado, Belkys A, MD       Or  . ondansetron (ZOFRAN) injection 4 mg  4 mg Intravenous Q6H PRN Regalado, Belkys A, MD      . senna (SENOKOT) tablet 8.6 mg  1 tablet Oral BID Regalado, Belkys A, MD   8.6 mg at 04/05/17 2254     Objective: Vital: Vitals:   04/05/17 2045 04/05/17 2100 04/05/17 2210 04/06/17 0613  BP: (!) 140/103 133/80 129/76 140/79  Pulse: 91 78 74 74  Resp: 19 18 16 15   Temp: 98.3 F (36.8 C)  98 F (36.7 C) 98.1 F (36.7 C) 97.9 F (36.6 C)  TempSrc:   Oral Oral  SpO2: 100% 100% 98% 98%  Weight:      Height:       I/Os: I/O last 3 completed shifts: In: 1173.3 [P.O.:240; I.V.:733.3; IV Piggyback:200] Out: 1200 [Emesis/NG output:1200]  Physical Exam:  General: Patient is in no apparent distress Lungs: Normal respiratory effort, chest expands symmetrically. GI: The abdomen is soft and nontender without mass. GU: no CVA tenderness  Ext: lower extremities symmetric  Lab Results:  Recent Labs  04/05/17 1054 04/06/17 0551  WBC 14.4* 9.1  HGB 13.1 10.6*  HCT 37.6 31.1*    Recent Labs  04/05/17 1054 04/06/17 0551  NA 133* 138  K 3.8 3.8  CL 101 107  CO2 22 24  GLUCOSE 119* 88  BUN 14 12  CREATININE 0.79 0.60  CALCIUM 9.2 8.7*   No results for input(s): LABPT, INR in the last 72 hours. No results for input(s): LABURIN in the last 72 hours. Results for orders placed or performed during the hospital encounter of 06/22/16  Urine culture     Status: Abnormal   Collection Time: 06/22/16 11:08 AM  Result Value Ref Range Status   Specimen Description URINE, CLEAN CATCH  Final  Special Requests NONE  Final   Culture >=100,000 COLONIES/mL ESCHERICHIA COLI (A)  Final   Report Status 06/25/2016 FINAL  Final   Organism ID, Bacteria ESCHERICHIA COLI (A)  Final      Susceptibility   Escherichia coli - MIC*    AMPICILLIN <=2 SENSITIVE Sensitive     CEFAZOLIN <=4 SENSITIVE Sensitive     CEFTRIAXONE <=1 SENSITIVE Sensitive     CIPROFLOXACIN <=0.25 SENSITIVE Sensitive     GENTAMICIN <=1 SENSITIVE Sensitive     IMIPENEM <=0.25 SENSITIVE Sensitive     NITROFURANTOIN <=16 SENSITIVE Sensitive     TRIMETH/SULFA <=20 SENSITIVE Sensitive     AMPICILLIN/SULBACTAM <=2 SENSITIVE Sensitive     PIP/TAZO <=4 SENSITIVE Sensitive     Extended ESBL NEGATIVE Sensitive     * >=100,000 COLONIES/mL ESCHERICHIA COLI  Wet prep, genital     Status: Abnormal   Collection Time:  06/22/16 11:45 AM  Result Value Ref Range Status   Yeast Wet Prep HPF POC NONE SEEN NONE SEEN Final   Trich, Wet Prep NONE SEEN NONE SEEN Final   Clue Cells Wet Prep HPF POC NONE SEEN NONE SEEN Final   WBC, Wet Prep HPF POC FEW (A) NONE SEEN Final   Sperm NONE SEEN  Final    Studies/Results: Ct Renal Stone Study  Result Date: 04/05/2017 CLINICAL DATA:  Acute right flank pain. EXAM: CT ABDOMEN AND PELVIS WITHOUT CONTRAST TECHNIQUE: Multidetector CT imaging of the abdomen and pelvis was performed following the standard protocol without IV contrast. COMPARISON:  CT scan of April 03, 2012. FINDINGS: Lower chest: No acute abnormality. Hepatobiliary: No gallstones are noted. Fatty infiltration of the liver is noted. Pancreas: Unremarkable. No pancreatic ductal dilatation or surrounding inflammatory changes. Spleen: Normal in size without focal abnormality. Adrenals/Urinary Tract: Adrenal glands appear normal. Right nephrolithiasis is noted with 13 mm calculus seen in lower pole calyx. 6 mm pelvic calculus is noted. Mild right hydronephrosis is noted with minimal ureteral dilatation. However no ureteral calculus is noted. Urinary bladder is unremarkable. Stomach/Bowel: Stomach is within normal limits. Appendix appears normal. No evidence of bowel wall thickening, distention, or inflammatory changes. Vascular/Lymphatic: Aortic atherosclerosis. No enlarged abdominal or pelvic lymph nodes. Reproductive: Uterus and bilateral adnexa are unremarkable. Other: No abdominal wall hernia or abnormality. No abdominopelvic ascites. Musculoskeletal: No acute or significant osseous findings. IMPRESSION: Fatty infiltration of the liver. Aortic atherosclerosis. Right nephrolithiasis is noted with 13 mm calculus seen in lower pole. 6 mm right renal pelvic calculus is noted, with mild right hydronephrosis. Minimal ureteral dilatation is noted, but no ureteral calculus is noted. Potentially this may be due to distal ureteral  spasm from recently passed stone. Electronically Signed   By: Marijo Conception, M.D.   On: 04/05/2017 12:40    Assessment: Right ureteral calculus with urinary tract infection  Procedure(s): CYSTOSCOPY WITH RETROGRADE PYELOGRAM/URETERAL STENT PLACEMENT, 1 Day Post-Op  doing well.  Plan: Patient has been afebrile after stent placement on ciprofloxacin.  She is doing well.  From a urological standpoint, okay to discharge with ciprofloxacin, pain medication, Flomax 0.4 mg daily, and oxybutynin 5 mg every 8 hours as needed for bladder spasms.   Link Snuffer, MD Urology 04/06/2017, 7:58 AM

## 2017-04-06 NOTE — Progress Notes (Signed)
RN reviewed discharge instructions with patient and family. All questions answered.   Paperwork and prescriptions given.   NT rolled patient down with all belongings to family car. 

## 2017-04-06 NOTE — Discharge Summary (Signed)
Physician Discharge Summary  Tara Bright:[id number] DOB: [birth year] DOA: 04/05/2017  PCP: [facility]  Admit date: 04/05/2017 Discharge date: 04/06/2017  Admitted From: home Disposition:  home  Recommendations for Outpatient Follow-up:  1. Follow up with Urology in 1-2 weeks 2. Please obtain BMP/CBC in one week  Home Health:No Equipment/Devices:None  Discharge Condition:stable CODE STATUS:Full Diet recommendation: Heart Healthy  Brief/Interim Summary: 51 y.o. female past medical history of recurrent UTIs and kidney stones with presents complaining of disorders. Tenderness along with CVA tenderness is started 3 days prior to admission. She relates she had fevers so she decided to come to the ED, here she had a CT scan that showed a right nephrolithiasis with a 13 mm calculus  Discharge Diagnoses:  Active Problems:   UTI (urinary tract infection)   Hyponatremia   Kidney stone   Hydronephrosis   Elevated blood pressure reading  Right-sided pyelonephritis with right kidney stone and hydronephrosis: She was started empirically on IV Rocephin. Urology was consulted to perform a cystoscopy with retrograde pyelogram and stent placement on 04/05/2017. They recommended to continue Flomax daily, oxybutynin and narcotics for pain. Continue ciprofloxacin for 10 days. Follow-up with urology in 1 week.  History of an elevated blood pressure: The elevation in her blood pressure can likely be due to pain. He did improve overnight, now this morning slowly trending up will continue to monitor.  Leukocytosis: Likely due to infectious etiology.    Hyponatremia: Resolved with IV fluid hydration.  Discharge Instructions  Discharge Instructions    Diet - low sodium heart healthy    Complete by:  As directed    Increase activity slowly    Complete by:  As directed      Allergies as of 04/06/2017      Reactions   Codeine Other (See Comments)   Gi upset   Other  Other (See Comments)   Just about all pain medications cause Nausea and Vomiting. Must take Phenergan before.    Penicillins Hives      Medication List    TAKE these medications   acetaminophen 500 MG tablet Commonly known as:  TYLENOL Take 1,000 mg by mouth every 4 (four) hours as needed.   AZO-CRANBERRY PO Take 2 tablets by mouth daily as needed (pain).   ciprofloxacin 500 MG tablet Commonly known as:  CIPRO Take 1 tablet (500 mg total) by mouth 2 (two) times daily.   ibuprofen 400 MG tablet Commonly known as:  ADVIL,MOTRIN Take 1 tablet (400 mg total) by mouth every 6 (six) hours as needed for moderate pain.            Discharge Care Instructions        Start     Ordered   04/06/17 0000  ciprofloxacin (CIPRO) 500 MG tablet  2 times daily     04/06/17 0813   04/06/17 0000  ibuprofen (ADVIL,MOTRIN) 400 MG tablet  Every 6 hours PRN     04/06/17 0813   04/06/17 0000  Increase activity slowly     04/06/17 0813   04/06/17 0000  Diet - low sodium heart healthy     04/06/17 0813      Allergies  Allergen Reactions  . Codeine Other (See Comments)    Gi upset  . Other Other (See Comments)    Just about all pain medications cause Nausea and Vomiting. Must take Phenergan before.   Marland Kitchen Penicillins Hives    Consultations:  Urology   Procedures/Studies: Ct  Renal Stone Study  Result Date: 04/05/2017 CLINICAL DATA:  Acute right flank pain. EXAM: CT ABDOMEN AND PELVIS WITHOUT CONTRAST TECHNIQUE: Multidetector CT imaging of the abdomen and pelvis was performed following the standard protocol without IV contrast. COMPARISON:  CT scan of April 03, 2012. FINDINGS: Lower chest: No acute abnormality. Hepatobiliary: No gallstones are noted. Fatty infiltration of the liver is noted. Pancreas: Unremarkable. No pancreatic ductal dilatation or surrounding inflammatory changes. Spleen: Normal in size without focal abnormality. Adrenals/Urinary Tract: Adrenal glands appear normal.  Right nephrolithiasis is noted with 13 mm calculus seen in lower pole calyx. 6 mm pelvic calculus is noted. Mild right hydronephrosis is noted with minimal ureteral dilatation. However no ureteral calculus is noted. Urinary bladder is unremarkable. Stomach/Bowel: Stomach is within normal limits. Appendix appears normal. No evidence of bowel wall thickening, distention, or inflammatory changes. Vascular/Lymphatic: Aortic atherosclerosis. No enlarged abdominal or pelvic lymph nodes. Reproductive: Uterus and bilateral adnexa are unremarkable. Other: No abdominal wall hernia or abnormality. No abdominopelvic ascites. Musculoskeletal: No acute or significant osseous findings. IMPRESSION: Fatty infiltration of the liver. Aortic atherosclerosis. Right nephrolithiasis is noted with 13 mm calculus seen in lower pole. 6 mm right renal pelvic calculus is noted, with mild right hydronephrosis. Minimal ureteral dilatation is noted, but no ureteral calculus is noted. Potentially this may be due to distal ureteral spasm from recently passed stone. Electronically Signed   By: Marijo Conception, M.D.   On: 04/05/2017 12:40     Subjective: She is no new complaints tolerating her diet.  Discharge Exam: Vitals:   04/05/17 2210 04/06/17 0613  BP: 129/76 140/79  Pulse: 74 74  Resp: 16 15  Temp: 98.1 F (36.7 C) 97.9 F (36.6 C)  SpO2: 98% 98%   Vitals:   04/05/17 2045 04/05/17 2100 04/05/17 2210 04/06/17 0613  BP: (!) 140/103 133/80 129/76 140/79  Pulse: 91 78 74 74  Resp: 19 18 16 15   Temp: 98.3 F (36.8 C) 98 F (36.7 C) 98.1 F (36.7 C) 97.9 F (36.6 C)  TempSrc:   Oral Oral  SpO2: 100% 100% 98% 98%  Weight:      Height:        General: Pt is alert, awake, not in acute distress Cardiovascular: RRR, S1/S2 +, no rubs, no gallops Respiratory: CTA bilaterally, no wheezing, no rhonchi Abdominal: Soft, NT, ND, bowel sounds + Extremities: no edema, no cyanosis    The results of significant diagnostics  from this hospitalization (including imaging, microbiology, ancillary and laboratory) are listed below for reference.     Microbiology: No results found for this or any previous visit (from the past 240 hour(s)).   Labs: BNP (last 3 results) No results for input(s): BNP in the last 8760 hours. Basic Metabolic Panel:  Recent Labs Lab 04/05/17 1054 04/06/17 0551  NA 133* 138  K 3.8 3.8  CL 101 107  CO2 22 24  GLUCOSE 119* 88  BUN 14 12  CREATININE 0.79 0.60  CALCIUM 9.2 8.7*   Liver Function Tests:  Recent Labs Lab 04/05/17 1054  AST 14*  ALT 12*  ALKPHOS 56  BILITOT 0.6  PROT 7.7  ALBUMIN 3.8    Recent Labs Lab 04/05/17 1054  LIPASE 33   No results for input(s): AMMONIA in the last 168 hours. CBC:  Recent Labs Lab 04/05/17 1054 04/06/17 0551  WBC 14.4* 9.1  NEUTROABS 10.7*  --   HGB 13.1 10.6*  HCT 37.6 31.1*  MCV 98.4 99.4  PLT  197 171   Cardiac Enzymes: No results for input(s): CKTOTAL, CKMB, CKMBINDEX, TROPONINI in the last 168 hours. BNP: Invalid input(s): POCBNP CBG: No results for input(s): GLUCAP in the last 168 hours. D-Dimer No results for input(s): DDIMER in the last 72 hours. Hgb A1c No results for input(s): HGBA1C in the last 72 hours. Lipid Profile No results for input(s): CHOL, HDL, LDLCALC, TRIG, CHOLHDL, LDLDIRECT in the last 72 hours. Thyroid function studies No results for input(s): TSH, T4TOTAL, T3FREE, THYROIDAB in the last 72 hours.  Invalid input(s): FREET3 Anemia work up No results for input(s): VITAMINB12, FOLATE, FERRITIN, TIBC, IRON, RETICCTPCT in the last 72 hours. Urinalysis    Component Value Date/Time   COLORURINE YELLOW 04/05/2017 1027   APPEARANCEUR HAZY (A) 04/05/2017 1027   LABSPEC 1.003 (L) 04/05/2017 1027   PHURINE 6.0 04/05/2017 1027   GLUCOSEU NEGATIVE 04/05/2017 1027   HGBUR LARGE (A) 04/05/2017 1027   BILIRUBINUR NEGATIVE 04/05/2017 1027   KETONESUR NEGATIVE 04/05/2017 1027   PROTEINUR NEGATIVE  04/05/2017 1027   UROBILINOGEN 0.2 09/21/2014 1405   NITRITE NEGATIVE 04/05/2017 1027   LEUKOCYTESUR LARGE (A) 04/05/2017 1027   Sepsis Labs Invalid input(s): PROCALCITONIN,  WBC,  LACTICIDVEN Microbiology No results found for this or any previous visit (from the past 240 hour(s)).   Time coordinating discharge: Over 30 minutes  SIGNED:   Charlynne Cousins, MD  [facility] 04/06/2017, 8:13 AM Pager   If 7PM-7AM, please contact night-coverage [email or web address] Password [email or web address]

## 2017-04-06 NOTE — Progress Notes (Deleted)
  Discharge planning, no HH needs identified. Requested RW and 3n1, contacted [facility] to deliver to room. [phone]

## 2017-04-07 LAB — URINE CULTURE

## 2017-04-07 NOTE — Anesthesia Postprocedure Evaluation (Signed)
Anesthesia Post Note  Patient: Tara Bright  Procedure(s) Performed: Procedure(s) (LRB): CYSTOSCOPY WITH RETROGRADE PYELOGRAM/URETERAL STENT PLACEMENT (Right)     Patient location during evaluation: PACU Anesthesia Type: General Level of consciousness: sedated and patient cooperative Pain management: pain level controlled Vital Signs Assessment: post-procedure vital signs reviewed and stable Respiratory status: spontaneous breathing Cardiovascular status: stable Anesthetic complications: no    Last Vitals:  Vitals:   04/05/17 2210 04/06/17 0613  BP: 129/76 140/79  Pulse: 74 74  Resp: 16 15  Temp: 36.7 C 36.6 C  SpO2: 98% 98%    Last Pain:  Vitals:   04/06/17 0940  TempSrc:   PainSc: 2                  Nolon Nations

## 2017-04-08 LAB — URINE CULTURE: Culture: >=100000 — AB

## 2017-04-12 ENCOUNTER — Other Ambulatory Visit: Payer: Self-pay | Admitting: Urology

## 2017-04-20 NOTE — Patient Instructions (Addendum)
Tara Bright  04/20/2017   Your procedure is scheduled on: 04-26-17  Report to [facility] Main  Entrance Take [place]  Elevators to 3rd floor to  [place] at 6:30 AM.   Call this number if you have problems the morning of surgery [phone]    Remember: ONLY 1 PERSON MAY GO WITH YOU TO SHORT STAY TO GET  READY MORNING OF [place].  Do not eat food or drink liquids :After Midnight.     Take these medicines the morning of surgery with A SIP OF WATER: None                                You may not have any metal on your body including hair pins and              piercings  Do not wear jewelry, make-up, lotions, powders or perfumes, deodorant             Do not wear nail polish.  Do not shave  48 hours prior to surgery.               Do not bring valuables to the hospital. [place].  Contacts, dentures or bridgework may not be worn into surgery.       Patients discharged the day of surgery will not be allowed to drive home.  Name and phone number of your driver: [name] [phone]                Please read over the following fact sheets you were given: _____________________________________________________________________             [facility] - Preparing for Surgery Before surgery, you can play an important role.  Because skin is not sterile, your skin needs to be as free of germs as possible.  You can reduce the number of germs on your skin by washing with CHG (chlorahexidine gluconate) soap before surgery.  CHG is an antiseptic cleaner which kills germs and bonds with the skin to continue killing germs even after washing. Please DO NOT use if you have an allergy to CHG or antibacterial soaps.  If your skin becomes reddened/irritated stop using the CHG and inform your nurse when you arrive at Short Stay. Do not shave (including legs and underarms) for at least 48 hours prior to the first  CHG shower.  You may shave your face/neck. Please follow these instructions carefully:  1.  Shower with CHG Soap the night before surgery and the  morning of Surgery.  2.  If you choose to wash your hair, wash your hair first as usual with your  normal  shampoo.  3.  After you shampoo, rinse your hair and body thoroughly to remove the  shampoo.                           4.  Use CHG as you would any other liquid soap.  You can apply chg directly  to the skin and wash                       Gently with a scrungie or clean washcloth.  5.  Apply the CHG Soap to your body ONLY FROM THE NECK DOWN.   Do not use on face/ open                           Wound or open sores. Avoid contact with eyes, ears mouth and genitals (private parts).                       Wash face,  Genitals (private parts) with your normal soap.             6.  Wash thoroughly, paying special attention to the area where your surgery  will be performed.  7.  Thoroughly rinse your body with warm water from the neck down.  8.  DO NOT shower/wash with your normal soap after using and rinsing off  the CHG Soap.                9.  Pat yourself dry with a clean towel.            10.  Wear clean pajamas.            11.  Place clean sheets on your bed the night of your first shower and do not  sleep with pets. Day of Surgery : Do not apply any lotions/deodorants the morning of surgery.  Please wear clean clothes to the hospital/surgery center.  FAILURE TO FOLLOW THESE INSTRUCTIONS MAY RESULT IN THE CANCELLATION OF YOUR SURGERY PATIENT SIGNATURE_________________________________  NURSE SIGNATURE__________________________________  ________________________________________________________________________

## 2017-04-21 ENCOUNTER — Encounter (HOSPITAL_COMMUNITY): Payer: Self-pay

## 2017-04-21 ENCOUNTER — Encounter (HOSPITAL_COMMUNITY)
Admission: RE | Admit: 2017-04-21 | Discharge: 2017-04-21 | Disposition: A | Discharge status: Home / Self Care | Payer: Self-pay | Source: Ambulatory Visit | Attending: Urology | Admitting: Urology

## 2017-04-21 DIAGNOSIS — Z01812 Encounter for preprocedural laboratory examination: Secondary | ICD-10-CM | POA: Insufficient documentation

## 2017-04-21 DIAGNOSIS — N202 Calculus of kidney with calculus of ureter: Secondary | ICD-10-CM | POA: Insufficient documentation

## 2017-04-21 DIAGNOSIS — Z01810 Encounter for preprocedural cardiovascular examination: Secondary | ICD-10-CM | POA: Insufficient documentation

## 2017-04-21 LAB — BASIC METABOLIC PANEL
Anion gap: 10 (ref 5–15)
BUN: 12 mg/dL (ref 6–20)
CALCIUM: 10.1 mg/dL (ref 8.9–10.3)
CO2: 25 mmol/L (ref 22–32)
CREATININE: 0.74 mg/dL (ref 0.44–1.00)
Chloride: 106 mmol/L (ref 101–111)
GFR calc Af Amer: >60 mL/min (ref >60)
GFR calc non Af Amer: >60 mL/min (ref >60)
GLUCOSE: 97 mg/dL (ref 65–99)
Potassium: 4 mmol/L (ref 3.5–5.1)
Sodium: 141 mmol/L (ref 135–145)

## 2017-04-21 LAB — CBC
HCT: 40.8 % (ref 36.0–46.0)
Hemoglobin: 14.2 g/dL (ref 12.0–15.0)
MCH: 33.2 pg (ref 26.0–34.0)
MCHC: 34.8 g/dL (ref 30.0–36.0)
MCV: 95.3 fL (ref 78.0–100.0)
PLATELETS: 324 10*3/uL (ref 150–400)
RBC: 4.28 MIL/uL (ref 3.87–5.11)
RDW: 12 % (ref 11.5–15.5)
WBC: 11.5 10*3/uL — ABNORMAL HIGH (ref 4.0–10.5)

## 2017-04-21 LAB — PREGNANCY, URINE: PREG TEST UR: NEGATIVE

## 2017-04-26 ENCOUNTER — Encounter (HOSPITAL_COMMUNITY)
Admission: RE | Disposition: A | Discharge status: Home / Self Care | Payer: Self-pay | Source: Ambulatory Visit | Attending: Urology

## 2017-04-26 ENCOUNTER — Encounter (HOSPITAL_COMMUNITY): Payer: Self-pay | Admitting: Emergency Medicine

## 2017-04-26 ENCOUNTER — Ambulatory Visit (HOSPITAL_COMMUNITY)
Admission: RE | Admit: 2017-04-26 | Discharge: 2017-04-26 | Disposition: A | Discharge status: Home / Self Care | Payer: Self-pay | Source: Ambulatory Visit | Attending: Urology | Admitting: Urology

## 2017-04-26 ENCOUNTER — Ambulatory Visit (HOSPITAL_COMMUNITY): Payer: Self-pay

## 2017-04-26 ENCOUNTER — Ambulatory Visit (HOSPITAL_COMMUNITY): Payer: Self-pay | Admitting: Certified Registered Nurse Anesthetist

## 2017-04-26 DIAGNOSIS — Z885 Allergy status to narcotic agent status: Secondary | ICD-10-CM | POA: Insufficient documentation

## 2017-04-26 DIAGNOSIS — Z880 Allergy status to penicillin: Secondary | ICD-10-CM | POA: Insufficient documentation

## 2017-04-26 DIAGNOSIS — N200 Calculus of kidney: Secondary | ICD-10-CM | POA: Insufficient documentation

## 2017-04-26 DIAGNOSIS — Z87442 Personal history of urinary calculi: Secondary | ICD-10-CM | POA: Insufficient documentation

## 2017-04-26 DIAGNOSIS — I10 Essential (primary) hypertension: Secondary | ICD-10-CM | POA: Insufficient documentation

## 2017-04-26 DIAGNOSIS — F17210 Nicotine dependence, cigarettes, uncomplicated: Secondary | ICD-10-CM | POA: Insufficient documentation

## 2017-04-26 DIAGNOSIS — Z79899 Other long term (current) drug therapy: Secondary | ICD-10-CM | POA: Insufficient documentation

## 2017-04-26 HISTORY — PX: CYSTOSCOPY/URETEROSCOPY/HOLMIUM LASER/STENT PLACEMENT: SHX6546

## 2017-04-26 SURGERY — CYSTOSCOPY/URETEROSCOPY/HOLMIUM LASER/STENT PLACEMENT
Anesthesia: General | Laterality: Right

## 2017-04-26 MED ORDER — SODIUM CHLORIDE 0.9 % IR SOLN
Status: DC | PRN
  Administered 2017-04-26: 4000 mL via INTRAVESICAL

## 2017-04-26 MED ORDER — CEFAZOLIN SODIUM-DEXTROSE 2-4 GM/100ML-% IV SOLN
2.0000 g | INTRAVENOUS | Status: AC
  Administered 2017-04-26: 2 g via INTRAVENOUS

## 2017-04-26 MED ORDER — CEFAZOLIN SODIUM-DEXTROSE 2-4 GM/100ML-% IV SOLN
INTRAVENOUS | Status: AC
  Filled 2017-04-26: qty 100

## 2017-04-26 MED ORDER — LACTATED RINGERS IV SOLN
INTRAVENOUS | Status: DC
  Administered 2017-04-26: 10:00:00 via INTRAVENOUS

## 2017-04-26 MED ORDER — MIDAZOLAM HCL 5 MG/5ML IJ SOLN
INTRAMUSCULAR | Status: DC | PRN
  Administered 2017-04-26: 2 mg via INTRAVENOUS

## 2017-04-26 MED ORDER — HYDROMORPHONE HCL-NACL 0.5-0.9 MG/ML-% IV SOSY
PREFILLED_SYRINGE | INTRAVENOUS | Status: AC
  Filled 2017-04-26: qty 1

## 2017-04-26 MED ORDER — ONDANSETRON HCL 4 MG/2ML IJ SOLN
INTRAMUSCULAR | Status: AC
  Filled 2017-04-26: qty 2

## 2017-04-26 MED ORDER — DEXAMETHASONE SODIUM PHOSPHATE 10 MG/ML IJ SOLN
INTRAMUSCULAR | Status: AC
  Filled 2017-04-26: qty 1

## 2017-04-26 MED ORDER — FENTANYL CITRATE (PF) 100 MCG/2ML IJ SOLN
INTRAMUSCULAR | Status: DC | PRN
  Administered 2017-04-26 (×2): 25 ug via INTRAVENOUS

## 2017-04-26 MED ORDER — FENTANYL CITRATE (PF) 100 MCG/2ML IJ SOLN
INTRAMUSCULAR | Status: AC
  Filled 2017-04-26: qty 2

## 2017-04-26 MED ORDER — LACTATED RINGERS IV SOLN
INTRAVENOUS | Status: DC | PRN
  Administered 2017-04-26: 08:00:00 via INTRAVENOUS

## 2017-04-26 MED ORDER — PHENYLEPHRINE 40 MCG/ML (10ML) SYRINGE FOR IV PUSH (FOR BLOOD PRESSURE SUPPORT)
PREFILLED_SYRINGE | INTRAVENOUS | Status: AC
  Filled 2017-04-26: qty 10

## 2017-04-26 MED ORDER — HYDROMORPHONE HCL-NACL 0.5-0.9 MG/ML-% IV SOSY
0.2500 mg | PREFILLED_SYRINGE | INTRAVENOUS | Status: DC | PRN
  Administered 2017-04-26 (×3): 0.5 mg via INTRAVENOUS

## 2017-04-26 MED ORDER — PROPOFOL 10 MG/ML IV BOLUS
INTRAVENOUS | Status: AC
  Filled 2017-04-26: qty 20

## 2017-04-26 MED ORDER — PROPOFOL 10 MG/ML IV BOLUS
INTRAVENOUS | Status: DC | PRN
  Administered 2017-04-26: 160 mg via INTRAVENOUS

## 2017-04-26 MED ORDER — OXYCODONE HCL 5 MG/5ML PO SOLN: 5.0000 mg | Freq: Once | ORAL | Status: DC | PRN

## 2017-04-26 MED ORDER — LABETALOL HCL 5 MG/ML IV SOLN
5.0000 mg | Freq: Once | INTRAVENOUS | Status: AC
  Administered 2017-04-26: 5 mg via INTRAVENOUS

## 2017-04-26 MED ORDER — OXYCODONE HCL 5 MG PO TABS: 5.0000 mg | ORAL_TABLET | Freq: Once | ORAL | Status: DC | PRN

## 2017-04-26 MED ORDER — LIDOCAINE 2% (20 MG/ML) 5 ML SYRINGE
INTRAMUSCULAR | Status: DC | PRN
  Administered 2017-04-26: 60 mg via INTRAVENOUS

## 2017-04-26 MED ORDER — ONDANSETRON HCL 4 MG/2ML IJ SOLN
INTRAMUSCULAR | Status: DC | PRN
  Administered 2017-04-26: 4 mg via INTRAVENOUS

## 2017-04-26 MED ORDER — LABETALOL HCL 5 MG/ML IV SOLN
INTRAVENOUS | Status: AC
  Filled 2017-04-26: qty 4

## 2017-04-26 MED ORDER — PHENYLEPHRINE 40 MCG/ML (10ML) SYRINGE FOR IV PUSH (FOR BLOOD PRESSURE SUPPORT)
PREFILLED_SYRINGE | INTRAVENOUS | Status: DC | PRN
Start: 2017-04-26 — End: 2017-04-26
  Administered 2017-04-26 (×2): 80 ug via INTRAVENOUS

## 2017-04-26 MED ORDER — DEXAMETHASONE SODIUM PHOSPHATE 10 MG/ML IJ SOLN
INTRAMUSCULAR | Status: DC | PRN
  Administered 2017-04-26: 10 mg via INTRAVENOUS

## 2017-04-26 MED ORDER — MIDAZOLAM HCL 2 MG/2ML IJ SOLN
INTRAMUSCULAR | Status: AC
  Filled 2017-04-26: qty 2

## 2017-04-26 MED ORDER — LIDOCAINE 2% (20 MG/ML) 5 ML SYRINGE
INTRAMUSCULAR | Status: AC
  Filled 2017-04-26: qty 5

## 2017-04-26 MED ORDER — PROMETHAZINE HCL 25 MG/ML IJ SOLN: 6.2500 mg | INTRAMUSCULAR | Status: DC | PRN

## 2017-04-26 SURGICAL SUPPLY — 19 items
BAG URO CATCHER STRL LF (MISCELLANEOUS) ×3 IMPLANT
BASKET LASER NITINOL 1.9FR (BASKET) ×1 IMPLANT
BSKT STON RTRVL 120 1.9FR (BASKET)
CATH INTERMIT  6FR 70CM (CATHETERS) ×2 IMPLANT
CLOTH BEACON ORANGE TIMEOUT ST (SAFETY) ×2 IMPLANT
COVER FOOTSWITCH UNIV (MISCELLANEOUS) ×3 IMPLANT
FIBER LASER FLEXIVA 365 (UROLOGICAL SUPPLIES) IMPLANT
FIBER LASER TRAC TIP (UROLOGICAL SUPPLIES) ×3 IMPLANT
GOWN STRL REUS W/TWL LRG LVL3 (GOWN DISPOSABLE) IMPLANT
GOWN STRL REUS W/TWL XL LVL3 (GOWN DISPOSABLE) ×2 IMPLANT
GUIDEWIRE ANG ZIPWIRE 038X150 (WIRE) ×3 IMPLANT
GUIDEWIRE STR DUAL SENSOR (WIRE) ×5 IMPLANT
INFUSOR MANOMETER BAG 3000ML (MISCELLANEOUS) ×1 IMPLANT
MANIFOLD NEPTUNE II (INSTRUMENTS) ×3 IMPLANT
PACK CYSTO (CUSTOM PROCEDURE TRAY) ×3 IMPLANT
SHEATH ACCESS URETERAL 38CM (SHEATH) ×2 IMPLANT
STENT URET 6FRX24 CONTOUR (STENTS) ×3 IMPLANT
SYRINGE 10CC LL (SYRINGE) ×1 IMPLANT
TUBE FEEDING 8FR 16IN STR KANG (MISCELLANEOUS) ×1 IMPLANT

## 2017-04-26 NOTE — H&P (Signed)
H&P  Chief Complaint: right ureteral calculus  History of Present Illness: 51 YO F recently underwent right ureteral stent placement for right ureteral calculus and UTI.  Completed course of abx and here for URS/LL. No complaints.  Past Medical History:  Diagnosis Date  . Ataxia 2016   and visual changes, left AMA before workup completed  . Hypertension   . Kidney stone   . UTI (urinary tract infection)    Past Surgical History:  Procedure Laterality Date  . CYSTOSCOPY W/ URETERAL STENT PLACEMENT Right 04/05/2017   Procedure: CYSTOSCOPY WITH RETROGRADE PYELOGRAM/URETERAL STENT PLACEMENT;  Surgeon: Lucas Mallow, MD;  Location: [facility]S;  Service: Urology;  Laterality: Right;  . LITHOTRIPSY      Home Medications:  Prescriptions Prior to Admission  Medication Sig Dispense Refill Last Dose  . acetaminophen (TYLENOL) 500 MG tablet Take 1,000 mg by mouth every 4 (four) hours as needed.   04/25/2017 at Unknown time  . ibuprofen (ADVIL,MOTRIN) 200 MG tablet Take 400 mg by mouth every 6 (six) hours as needed.   04/25/2017 at Unknown time  . tamsulosin (FLOMAX) 0.4 MG CAPS capsule Take 1 capsule (0.4 mg total) by mouth daily after supper. 30 capsule 0 Past Week at Unknown time   Allergies:  Allergies  Allergen Reactions  . Codeine Other (See Comments)    Gi upset  . Other Other (See Comments)    Just about all pain medications cause Nausea and Vomiting. Must take Phenergan before.   Marland Kitchen Penicillins Hives and Nausea And Vomiting    Family History  Problem Relation Age of Onset  . Cancer Mother   . Diabetes Father   . Heart failure Father    Social History:  reports that she has been smoking Cigarettes.  She has been smoking about 0.50 packs per day. She has never used smokeless tobacco. She reports that she does not drink alcohol or use drugs.  ROS: A complete review of systems was performed.  All systems are negative except for pertinent findings as noted. ROS   Physical Exam:   Vital signs in last 24 hours: Temp:  [98 F (36.7 C)] 98 F (36.7 C) (10/03 0623) Pulse Rate:  [104] 104 (10/03 0623) Resp:  [16] 16 (10/03 0623) BP: (154)/(104) 154/104 (10/03 0623) SpO2:  [100 %] 100 % (10/03 0623) Weight:  [54 kg (119 lb)] 54 kg (119 lb) (10/03 2202) General:  Alert and oriented, No acute distress HEENT: Normocephalic, atraumatic Neck: No JVD or lymphadenopathy Cardiovascular: Regular rate and rhythm Lungs: Regular rate and effort Abdomen: Soft, nontender, nondistended, no abdominal masses Back: No CVA tenderness Extremities: No edema Neurologic: Grossly intact  Laboratory Data:  No results found for this or any previous visit (from the past 24 hour(s)). No results found for this or any previous visit (from the past 240 hour(s)). Creatinine:  Recent Labs  04/21/17 0959  CREATININE 0.74    Impression/Assessment:  Right ureteral calculus  Plan:  Proceed with right URS/LL/stent.  Understands risk of infection, especially with her hx of infections.  Marton Redwood, III 04/26/2017, 8:13 AM

## 2017-04-26 NOTE — Discharge Instructions (Signed)
[facility] °[phone] °Post Ureteroscopy With or Without Stent Instructions ° °Definitions: ° °Ureter: The duct that transports urine from the kidney to the bladder. °Stent:   A plastic hollow tube that is placed into the ureter, from the kidney to the                 bladder to prevent the ureter from swelling shut. ° °GENERAL INSTRUCTIONS: ° °Despite the fact that no skin incisions were used, the area around the ureter and bladder is raw and irritated. The stent is a foreign body which will further irritate the bladder wall. This irritation is manifested by increased frequency of urination, both day and night, and by an increase in the urge to urinate. In some, the urge to urinate is present almost always. Sometimes the urge is strong enough that you may not be able to stop yourself from urinating. The only real cure is to remove the stent and then give time for the bladder wall to heal which can't be done until the danger of the ureter swelling shut has passed, which varies. ° °You may see some blood in your urine while the stent is in place and a few days afterwards. Do not be alarmed, even if the urine was clear for a while. Get off your feet and drink lots of fluids until clearing occurs. If you start to pass clots or don't improve, call us. ° °DIET: °You may return to your normal diet immediately. Because of the raw surface of your bladder, alcohol, spicy foods, acid type foods and drinks with caffeine may cause irritation or frequency and should be used in moderation. To keep your urine flowing freely and to avoid constipation, drink plenty of fluids during the day ( 8-10 glasses ). °Tip: Avoid cranberry juice because it is very acidic. ° °ACTIVITY: °Your physical activity doesn't need to be restricted. However, if you are very active, you may see some blood in your urine. We suggest that you reduce your activity under these circumstances until the bleeding has stopped. ° °BOWELS: °It is  important to keep your bowels regular during the postoperative period. Straining with bowel movements can cause bleeding. A bowel movement every other day is reasonable. Use a mild laxative if needed, such as Milk of Magnesia 2-3 tablespoons, or 2 Dulcolax tablets. Call if you continue to have problems. If you have been taking narcotics for pain, before, during or after your surgery, you may be constipated. Take a laxative if necessary. ° ° °MEDICATION: °You should resume your pre-surgery medications unless told not to. °You may take oxybutynin or flomax if prescribed for bladder spasms or discomfort from the stent °Take pain medication as directed for pain refractory to conservative management ° °PROBLEMS YOU SHOULD REPORT TO US: °· Fevers over 100.5 Fahrenheit. °· Heavy bleeding, or clots ( See above notes about blood in urine ). °· Inability to urinate. °· Drug reactions ( hives, rash, nausea, vomiting, diarrhea ). °· Severe burning or pain with urination that is not improving. ° °General Anesthesia, Adult, Care After °These instructions provide you with information about caring for yourself after your procedure. Your health care provider may also give you more specific instructions. Your treatment has been planned according to current medical practices, but problems sometimes occur. Call your health care provider if you have any problems or questions after your procedure. °What can I expect after the procedure? °After the procedure, it is common to have: °· Vomiting. °· A sore   throat. °· Mental slowness. ° °It is common to feel: °· Nauseous. °· Cold or shivery. °· Sleepy. °· Tired. °· Sore or achy, even in parts of your body where you did not have surgery. ° °Follow these instructions at home: °For at least 24 hours after the procedure: °· Do not: °? Participate in activities where you could fall or become injured. °? Drive. °? Use heavy machinery. °? Drink alcohol. °? Take sleeping pills or medicines that cause  drowsiness. °? Make important decisions or sign legal documents. °? Take care of children on your own. °· Rest. °Eating and drinking °· If you vomit, drink water, juice, or soup when you can drink without vomiting. °· Drink enough fluid to keep your urine clear or pale yellow. °· Make sure you have little or no nausea before eating solid foods. °· Follow the diet recommended by your health care provider. °General instructions °· Have a responsible adult stay with you until you are awake and alert. °· Return to your normal activities as told by your health care provider. Ask your health care provider what activities are safe for you. °· Take over-the-counter and prescription medicines only as told by your health care provider. °· If you smoke, do not smoke without supervision. °· Keep all follow-up visits as told by your health care provider. This is important. °Contact a health care provider if: °· You continue to have nausea or vomiting at home, and medicines are not helpful. °· You cannot drink fluids or start eating again. °· You cannot urinate after 8-12 hours. °· You develop a skin rash. °· You have fever. °· You have increasing redness at the site of your procedure. °Get help right away if: °· You have difficulty breathing. °· You have chest pain. °· You have unexpected bleeding. °· You feel that you are having a life-threatening or urgent problem. °This information is not intended to replace advice given to you by your health care provider. Make sure you discuss any questions you have with your health care provider. °Document Released: 10/17/2000 Document Revised: 12/14/2015 Document Reviewed: 06/25/2015 °Elsevier Interactive Patient Education © 2018 [employer] ° ° °

## 2017-04-26 NOTE — Progress Notes (Signed)
Blood pressure 146/95

## 2017-04-26 NOTE — Anesthesia Postprocedure Evaluation (Signed)
Anesthesia Post Note  Patient: Tara Bright  Procedure(s) Performed: CYSTOSCOPY/URETEROSCOPY/HOLMIUM LASER/STENT PLACEMENT (Right )     Patient location during evaluation: PACU Anesthesia Type: General Level of consciousness: awake and alert Pain management: pain level controlled Vital Signs Assessment: post-procedure vital signs reviewed and stable Respiratory status: spontaneous breathing, nonlabored ventilation and respiratory function stable Cardiovascular status: blood pressure returned to baseline and stable Postop Assessment: no apparent nausea or vomiting Anesthetic complications: no    Last Vitals:  Vitals:   04/26/17 1025 04/26/17 1030  BP: (!) 151/92 (!) 146/95  Pulse: 82 79  Resp: 14 12  Temp:  36.8 C  SpO2: 100% 95%    Last Pain:  Vitals:   04/26/17 0623  TempSrc: Oral  PainSc: [place]

## 2017-04-26 NOTE — Progress Notes (Signed)
Labetalol 5 mg IVP given for elevated blood pressures and heart rates.

## 2017-04-26 NOTE — Anesthesia Preprocedure Evaluation (Addendum)
Anesthesia Evaluation  Patient identified by MRN, date of birth, ID band Patient awake    Reviewed: Allergy & Precautions, NPO status , Patient's Chart, lab work & pertinent test results  Airway Mallampati: II  TM Distance: >3 FB Neck ROM: Full    Dental no notable dental hx. (+) Loose,    Pulmonary neg pulmonary ROS, Current Smoker,    Pulmonary exam normal breath sounds clear to auscultation       Cardiovascular hypertension, Pt. on medications negative cardio ROS Normal cardiovascular exam Rhythm:Regular Rate:Normal     Neuro/Psych negative neurological ROS  negative psych ROS   GI/Hepatic negative GI ROS, Neg liver ROS,   Endo/Other  negative endocrine ROS  Renal/GU Renal diseasenegative Renal ROS  negative genitourinary   Musculoskeletal negative musculoskeletal ROS (+)   Abdominal   Peds negative pediatric ROS (+)  Hematology negative hematology ROS (+)   Anesthesia Other Findings   Reproductive/Obstetrics negative OB ROS                            Anesthesia Physical  Anesthesia Plan  ASA: II  Anesthesia Plan: General   Post-op Pain Management:    Induction: Intravenous  PONV Risk Score and Plan: 3 and Ondansetron, Dexamethasone and Midazolam  Airway Management Planned: LMA  Additional Equipment:   Intra-op Plan:   Post-operative Plan: Extubation in OR  Informed Consent: I have reviewed the patients History and Physical, chart, labs and discussed the procedure including the risks, benefits and alternatives for the proposed anesthesia with the patient or authorized representative who has indicated his/her understanding and acceptance.   Dental advisory given  Plan Discussed with: CRNA  Anesthesia Plan Comments:         Anesthesia Quick Evaluation

## 2017-04-26 NOTE — Op Note (Signed)
Operative Note  Preoperative diagnosis:  1. Right renal and ureteral calculus  Postoperative diagnosis: 1. Right renal calculus  Procedure(s): 1. Cystoscopy, right ureteroscopy, laser lithotripsy, ureteral stent exchange  Surgeon: Link Snuffer, MD  Assistants:none  Anesthesia: none  Complications: none immediate  EBL: minimal  Specimens: 1. none  Drains/Catheters: 1. 6x24 JJ stent  Intraoperative findings: Cystourethroscopy revealed a normal bladder cancer for some edema likely secondary to the ureteral stent. There were no stones along the course of the ureter. There is a large renal calculus and a smaller lower pole renal calculus no both fragmented to less than 1 mm fragments.  Indication: This is a 51 year old female who recently underwent ureteral stent placement on the right for a right ureteral calculus and urinary tract infection. She has completed a course of antibiotics. She presents for definitive management of the stone.  Description of procedure:  The patient was identified and consent was obtained. She was taken back to the operating room and placed in the supine position. Gen. anesthesia was induced. Antibiotics were administered. She was converted to dorsal lithotomy. She was prepped and draped in standard sterile fashion. Timeout was performed. A 22 French rigid cystoscope was advanced into the urethra and into the bladder and complete cystoscopy revealed the findings noted above. The right ureter was cannulated with a sensor wire which was advanced up to the kidney under fluoroscopic guidance. Graspers removed the ureteral stent until it was just beyond the urethra and a second sensor wire was advanced up the stent under fluoroscopic guidance. Semirigid ureteroscopy was performed alongside the wires which revealed no ureteral calculi. A 12 x 14 ureteral access sheath was advanced under fluoroscopic guidance up the ureter over one of the wires up to the renal pelvis.  Flexible ureteroscopy revealed one large stone as well as a smaller lower pole stone. These were both fragmented to less than 1 mm fragments. No significant clinical stone fragments remained. The scope and access sheath were withdrawn with no evidence of any ureteral calculi or ureteral injury. The wire was backloaded onto a cystoscope and a 6 x 24 double-J ureteral stent was placed in the standard fashion followed by removal of the wire. Fluoroscopy confirmed proximal placement and direct visualization confirmed a good coil in the bladder. The bladder was drained and the scope withdrawn and this concluded the operation. The patient tolerated procedure well and was stable postoperatively.  Plan:The patient will return in 1 week for a ureteral stent removal.

## 2017-04-26 NOTE — Transfer of Care (Signed)
Immediate Anesthesia Transfer of Care Note  Patient: Tara Bright  Procedure(s) Performed: CYSTOSCOPY/URETEROSCOPY/HOLMIUM LASER/STENT PLACEMENT (Right )  Patient Location: PACU  Anesthesia Type:General  Level of Consciousness: awake and alert   Airway & Oxygen Therapy: Patient Spontanous Breathing and Patient connected to face mask oxygen  Post-op Assessment: Report given to RN and Post -op Vital signs reviewed and stable  Post vital signs: Reviewed and stable  Last Vitals:  Vitals:   04/26/17 0623  BP: (!) 154/104  Pulse: (!) 104  Resp: 16  Temp: 36.7 C  SpO2: 100%    Last Pain:  Vitals:   04/26/17 0623  TempSrc: Oral  PainSc: 8       Patients Stated Pain Goal: 4 (32/[id number])  Complications: No apparent anesthesia complications

## 2017-04-26 NOTE — Anesthesia Procedure Notes (Signed)
Procedure Name: LMA Insertion Date/Time: 04/26/2017 7:28 AM Performed by: [place] Gurshaan Matsuoka C Pre-anesthesia Checklist: Patient identified, Emergency Drugs available, Suction available and Patient being monitored Patient Re-evaluated:Patient Re-evaluated prior to induction Oxygen Delivery Method: Circle system utilized Preoxygenation: Pre-oxygenation with 100% oxygen Induction Type: IV induction Ventilation: Mask ventilation without difficulty LMA: LMA inserted LMA Size: 4.0 Number of attempts: 1 Airway Equipment and Method: Bite block Placement Confirmation: positive ETCO2 Tube secured with: Tape Dental Injury: Teeth and Oropharynx as per pre-operative assessment  Comments: Pt with loose front right tooth prior to induction- same after lma insertion

## 2017-04-26 NOTE — Progress Notes (Signed)
Spoke to [place], pharmacist regarding ancef order and past reaction to penicillin.  Sharyn Lull looked at prior medications that she has had in the past and has had multiple doses of cephalosporins without mention of allergic reaction. Per Sharyn Lull, this antibiotic is ok for this patient.

## 2017-04-26 NOTE — Interval H&P Note (Signed)
History and Physical Interval Note:  04/26/2017 8:16 AM  Tara Bright  has presented today for surgery, with the diagnosis of RIGHT URETERAL AND RENAL CALCULUS  The various methods of treatment have been discussed with the patient and family. After consideration of risks, benefits and other options for treatment, the patient has consented to  Procedure(s): CYSTOSCOPY/URETEROSCOPY/HOLMIUM LASER/STENT PLACEMENT (Right) as a surgical intervention .  The patient's history has been reviewed, patient examined, no change in status, stable for surgery.  I have reviewed the patient's chart and labs.  Questions were answered to the patient's satisfaction.     Marton Redwood, III

## 2017-04-27 ENCOUNTER — Encounter (HOSPITAL_COMMUNITY): Payer: Self-pay | Admitting: Urology

## 2018-09-06 ENCOUNTER — Ambulatory Visit (INDEPENDENT_AMBULATORY_CARE_PROVIDER_SITE_OTHER): Payer: Self-pay | Admitting: General Surgery

## 2018-09-06 ENCOUNTER — Encounter: Payer: Self-pay | Admitting: General Surgery

## 2018-09-06 VITALS — BP 178/92 | HR 82 | Temp 98.9°F | Resp 20 | Wt 119.8 lb

## 2018-09-06 DIAGNOSIS — I714 Abdominal aortic aneurysm, without rupture, unspecified: Secondary | ICD-10-CM | POA: Insufficient documentation

## 2018-09-06 DIAGNOSIS — R0989 Other specified symptoms and signs involving the circulatory and respiratory systems: Secondary | ICD-10-CM

## 2018-09-06 DIAGNOSIS — I998 Other disorder of circulatory system: Secondary | ICD-10-CM | POA: Insufficient documentation

## 2018-09-06 DIAGNOSIS — I739 Peripheral vascular disease, unspecified: Secondary | ICD-10-CM | POA: Insufficient documentation

## 2018-09-06 DIAGNOSIS — K811 Chronic cholecystitis: Secondary | ICD-10-CM

## 2018-09-06 NOTE — Patient Instructions (Addendum)
Cholecystitis  Cholecystitis is inflammation of the gallbladder. It is often called a gallbladder attack. The gallbladder is a pear-shaped organ that lies beneath the liver on the right side of the body. The gallbladder stores bile, which is a fluid that helps the body digest fats. If bile builds up in your gallbladder, your gallbladder becomes inflamed. This condition may occur suddenly. Cholecystitis is a serious condition and requires treatment. What are the causes? The most common cause of this condition is gallstones. Gallstones can block the tube (duct) that carries bile out of your gallbladder. This causes bile to build up. Other causes include:  Damage to the gallbladder due to a decrease in blood flow.  Infections in the bile ducts.  Scars or kinks in the bile ducts.  Tumors in the liver, pancreas, or gallbladder. What increases the risk? You are more likely to develop this condition if:  You have sickle cell disease.  You take birth control pills or use estrogen.  You have alcoholic liver disease.  You have liver cirrhosis.  You have your nutrition delivered through a vein (parenteral nutrition).  You are critically ill.  You do not eat or drink for a long time. This is also called "fasting."  You are obese.  You lose weight too fast.  You are pregnant.  You have high levels of fat (triglycerides) in the blood.  You have pancreatitis. What are the signs or symptoms? Symptoms of this condition include:  Pain in the abdomen, especially in the upper right area of the abdomen.  Tenderness or bloating in the abdomen.  Nausea.  Vomiting.  Fever.  Chills. How is this diagnosed? This condition is diagnosed with a medical history and physical exam. You may also have other tests, including:  Imaging tests, such as: ? An ultrasound of the gallbladder. ? A CT scan of the abdomen. ? A gallbladder nuclear scan (HIDA scan). This scan allows your health care  provider to see the bile moving from your liver to your gallbladder and on to your small intestine. ? MRI.  Blood tests, such as: ? A complete blood count. The white blood cell count may be higher than normal. ? Liver function tests. Certain types of gallstones cause some results to be higher than normal. How is this treated? Treatment may include:  Surgery to remove your gallbladder (cholecystectomy).  Antibiotic medicine, usually through an IV.  Fasting for a certain amount of time.  Giving IV fluids.  Medicine to treat pain or vomiting. Follow these instructions at home:  If you had surgery, follow instructions from your health care provider about home care after the procedure. Medicines   Take over-the-counter and prescription medicines only as told by your health care provider.  If you were prescribed an antibiotic medicine, take it as told by your health care provider. Do not stop taking the antibiotic even if you start to feel better. General instructions  Follow instructions from your health care provider about what to eat or drink. When you are allowed to eat, avoid eating or drinking anything that triggers your symptoms.  Do not lift anything that is heavier than 10 lb (4.5 kg), or the limit that you are told, until your health care provider says that it is safe.  Do not use any products that contain nicotine or tobacco, such as cigarettes and e-cigarettes. If you need help quitting, ask your health care provider.  Keep all follow-up visits as told by your health care provider. This is   important. Contact a health care provider if:  Your pain is not controlled with medicine.  You have a fever. Get help right away if:  Your pain moves to another part of your abdomen or to your back.  You continue to have symptoms or you develop new symptoms even with treatment. Summary  Cholecystitis is inflammation of the gallbladder.  The most common cause of this condition  is gallstones. Gallstones can block the tube (duct) that carries bile out of your gallbladder.  Common symptoms are pain in the abdomen, nausea, vomiting, fever, and chills.  This condition is treated with surgery to remove the gallbladder, medicines, fasting, and IV fluids.  Follow your health care provider's instructions for eating and drinking. Avoid eating anything that triggers your symptoms. This information is not intended to replace advice given to you by your health care provider. Make sure you discuss any questions you have with your health care provider. Document Released: 07/11/2005 Document Revised: 11/17/2017 Document Reviewed: 11/17/2017 Elsevier Interactive Patient Education  2019 [place].   Abdominal Aortic Aneurysm  An aneurysm is a bulge in one of the blood vessels that carry blood away from the heart (artery). It happens when blood pushes up against a weak or damaged place in the wall of an artery. An abdominal aortic aneurysm happens in the main artery of the body (aorta). Some aneurysms may not cause problems. If it grows, it can burst or tear, causing bleeding inside the body. This is an emergency. It needs to be treated right away. What are the causes? The exact cause of this condition is not known. What increases the risk? The following may make you more likely to get this condition:  Being a female who is 72 years of age or older.  Being white (Caucasian).  Using tobacco.  Having a family history of aneurysms.  Having the following conditions: ? Hardening of the arteries (arteriosclerosis). ? Inflammation of the walls of an artery (arteritis). ? Certain genetic conditions. ? Being very overweight (obesity). ? An infection in the wall of the aorta (infectious aortitis). ? High cholesterol. ? High blood pressure (hypertension). What are the signs or symptoms? Symptoms depend on the size of the aneurysm and how fast it is growing. Most grow slowly and  do not cause any symptoms. If symptoms do occur, they may include:  Pain in the belly (abdomen), side, or back.  Feeling full after eating only small amounts of food.  Feeling a throbbing lump in the belly. Symptoms that the aneurysm has burst (ruptured) include:  Sudden, very bad pain in the belly, side, or back.  Feeling sick to your stomach (nauseous).  Throwing up (vomiting).  Feeling light-headed or passing out. How is this treated? Treatment for this condition depends on:  The size of the aneurysm.  How fast it is growing.  Your age.  Your risk of having it burst. If your aneurysm is smaller than 2 inches (5 cm), your doctor may manage it by:  Checking it often to see if it is getting bigger. You may have an imaging test (ultrasound) to check it every 3-6 months, every year, or every few years.  Giving you medicines to: ? Control blood pressure. ? Treat pain. ? Fight infection. If your aneurysm is larger than 2 inches (5 cm), you may need surgery to fix it. Follow these instructions at home: Lifestyle  Do not use any products that have nicotine or tobacco in them. This includes cigarettes, e-cigarettes, and  chewing tobacco. If you need help quitting, ask your doctor.  Get regular exercise. Ask your doctor what types of exercise are best for you. Eating and drinking  Eat a heart-healthy diet. This includes eating plenty of: ? Fresh fruits and vegetables. ? Whole grains. ? Low-fat (lean) protein. ? Low-fat dairy products.  Avoid foods that are high in saturated fat and cholesterol. These foods include red meat and some dairy products.  Do not drink alcohol if: ? Your doctor tells you not to drink. ? You are pregnant, may be pregnant, or are planning to become pregnant.  If you drink alcohol: ? Limit how much you use to:  0-1 drink a day for women.  0-2 drinks a day for men. ? Be aware of how much alcohol is in your drink. In the [place] one drink equals  any of these:  One typical bottle of beer (12 oz).  One-half glass of wine (5 oz).  One shot of hard liquor (1 oz). General instructions  Take over-the-counter and prescription medicines only as told by your doctor.  Keep your blood pressure within normal limits. Ask your doctor what your blood pressure should be.  Have your blood sugar (glucose) level and cholesterol levels checked regularly. Keep your blood sugar level and cholesterol levels within normal limits.  Avoid heavy lifting and activities that take a lot of effort. Ask your doctor what activities are safe for you.  Keep all follow-up visits as told by your doctor. This is important. ? Talk to your doctor about regular screenings to see if the aneurysm is getting bigger. Contact a doctor if you:  Have pain in your belly, side, or back.  Have a throbbing feeling in your belly.  Have a family history of aneurysms. Get help right away if you:  Have sudden, bad pain in your belly, side, or back.  Feel sick to your stomach.  Throw up.  Have trouble pooping (constipation).  Have trouble peeing (urinating).  Feel light-headed.  Have a fast heart rate when you stand.  Have sweaty skin that is cold to the touch (clammy).  Have shortness of breath.  Have a fever. These symptoms may be an emergency. Do not wait to see if the symptoms will go away. Get medical help right away. Call your local emergency services (911 in the [place] Do not drive yourself to the hospital. Summary  An aneurysm is a bulge in one of the blood vessels that carry blood away from the heart (artery). Some aneurysms may not cause problems.  You may need to have yours checked often. If it grows, it can burst or tear. This causes bleeding inside the body. It needs to be treated right away.  Follow instructions from your doctor about healthy lifestyle changes.  Keep all follow-up visits as told by your doctor. This is important. This  information is not intended to replace advice given to you by your health care provider. Make sure you discuss any questions you have with your health care provider. Document Released: 11/05/2012 Document Revised: 02/17/2018 Document Reviewed: 02/17/2018 Elsevier Interactive Patient Education  2019 [employer]    Carotid Artery Disease  The carotid arteries are arteries on both sides of the neck. They carry blood to the brain, face, and neck. Carotid artery disease happens when these arteries become smaller (narrow) or get blocked. If these arteries become smaller or get blocked, you are more likely to have a stroke or a warning stroke (transient ischemic attack).  Follow these instructions at home:  Take over-the-counter and prescription medicines only as told by your doctor.  Make sure you understand all instructions about your medicines. Do not stop taking your medicines without talking to your doctor first.  Follow your doctor's diet instructions. It is important to follow a healthy diet. ? Eat foods that include plenty of: ? Fresh fruits. ? Vegetables. ? Lean meats. ? Avoid these foods: ? Foods that are high in fat. ? Foods that are high in salt (sodium). ? Foods that are fried. ? Foods that are processed. ? Foods that have few good nutrients (poor nutritional value).  Keep a healthy weight.  Stay active. Get at least 30 minutes of activity every day.  Do not smoke.  Limit alcohol use to: ? No more than 2 drinks a day for men. ? No more than 1 drink a day for women who are not pregnant.  Do not use illegal drugs.  Keep all follow-up visits as told by your doctor. This is important. Contact a doctor if: Get help right away if:  You have any symptoms of stroke or TIA. The acronym BEFAST is an easy way to remember the main warning signs of stroke. ? B = Balance problems. Signs include dizziness, sudden trouble walking, or loss of balance ? E = Eye problems. This  includes trouble seeing or a sudden change in vision. ? F = Face changes. This includes sudden weakness or numbness of the face, or the face or eyelid drooping to one side. ? A = Arm weakness or numbness. This happens suddenly and usually on one side of the body. ? S = Speech problems. This includes trouble speaking or trouble understanding. ? T = Time. Time to call 911 or seek emergency care. Do not wait to see if symptoms go away. Make note of the time your symptoms started.  Other signs of stroke may include: ? A sudden, severe headache with no known cause. ? Feeling sick to your stomach (nauseous) or throwing up (vomiting). ? Seizure. Call your local emergency services (911 in [place] Do notdrive yourself to the clinic or hospital. Summary  The carotid arteries are arteries on both sides of the neck.  If these arteries get smaller or get blocked, you are more likely to have a stroke or a warning stroke (transient ischemic attack).  Take over-the-counter and prescription medicines only as told by your doctor.  Keep all follow-up visits as told by your doctor. This is important. This information is not intended to replace advice given to you by your health care provider. Make sure you discuss any questions you have with your health care provider. Document Released: 06/27/2012 Document Revised: 07/06/2017 Document Reviewed: 07/06/2017 Elsevier Interactive Patient Education  2019 [employer]   Atherosclerosis  Atherosclerosis is narrowing and hardening of the arteries. Arteries are blood vessels that carry blood from the heart to all parts of the body. This blood contains oxygen. Arteries can become narrow or clogged with a buildup of fat, cholesterol, calcium, and other substances (plaque). Plaque decreases the amount of blood that can flow through the artery. Atherosclerosis can affect any artery in the body, including:  Heart arteries (coronary artery disease). This may cause a  heart attack.  Brain arteries. This may cause a stroke (cerebrovascular accident).  Leg, arm, and pelvis arteries (peripheral artery disease). This may cause pain and numbness.  Kidney arteries. This may cause kidney (renal) failure. Treatment may slow the disease and prevent further  damage to the heart, brain, peripheral arteries, and kidneys. What are the causes? Atherosclerosis develops slowly over many years. The inner layers of your arteries become damaged and allow the gradual buildup of plaque. The exact cause of atherosclerosis is not fully understood. Symptoms of atherosclerosis do not occur until the artery becomes narrow or blocked. What increases the risk? The following factors may make you more likely to develop this condition:  High blood pressure.  High cholesterol.  Being middle-aged or older.  Having a family history of atherosclerosis.  Having high blood fats (triglycerides).  Diabetes.  Being overweight.  Smoking tobacco.  Not exercising enough (sedentary lifestyle).  Having a substance in the blood called C-reactive protein (CRP). This is a sign of increased levels of inflammation in the body.  Sleep apnea.  Being stressed.  Drinking too much alcohol. What are the signs or symptoms? This condition may not cause any symptoms. If you have symptoms, they are caused by damage to an area of your body that is not getting enough blood.  Coronary artery disease may cause chest pain and shortness of breath.  Decreased blood supply to your brain may cause a stroke. Signs of a stroke may include sudden: ? Weakness on one side of the body. ? Confusion. ? Changes in vision. ? Inability to speak or understand speech. ? Loss of balance, coordination, or the ability to walk. ? Severe headache. ? Loss of consciousness.  Peripheral arterial disease may cause pain and numbness, often in the legs and hips.  Renal failure may cause fatigue, nausea, swelling, and  itchy skin. How is this diagnosed? This condition is diagnosed based on your medical history and a physical exam. During the exam:  Your health care provider will: ? Check your pulse in different places. ? Listen for a "whooshing" sound over your arteries (bruit).  You may have tests, such as: ? Blood tests to check your levels of cholesterol, triglycerides, and CRP. ? Electrocardiogram (ECG) to check for heart damage. ? Chest X-ray to see if you have an enlarged heart, which is a sign of heart failure. ? Stress test to see how your heart reacts to exercise. ? Echocardiogram to get images of the inside of your heart. ? Ankle-brachial index to compare blood pressure in your arms to blood pressure in your ankles. ? Ultrasound of your peripheral arteries to check blood flow. ? CT scan to check for damage to your heart or brain. ? X-rays of blood vessels after dye has been injected (angiogram) to check blood flow. How is this treated? Treatment starts with lifestyle changes, which may include:  Changing your diet.  Losing weight.  Reducing stress.  Exercising and being physically active more regularly.  Not smoking. You may also need medicine to:  Lower triglycerides and cholesterol.  Control blood pressure.  Prevent blood clots.  Lower inflammation in your body.  Control your blood sugar. Sometimes, surgery is needed to:  Remove plaque from an artery (endarterectomy).  Open or widen a narrowed heart artery (angioplasty).  Create a new path for your blood with one of these procedures: ? Heart (coronary) artery bypass graft surgery. ? Peripheral artery bypass graft surgery. Follow these instructions at home: Eating and drinking   Eat a heart-healthy diet. Talk with your health care provider or a diet and nutrition specialist (dietitian) if you need help. A heart-healthy diet involves: ? Limiting unhealthy fats and increasing healthy fats. Some examples of healthy  fats are olive oil and  canola oil. ? Eating plant-based foods, such as fruits, vegetables, nuts, whole grains, and legumes (such as peas and lentils).  Limit alcohol intake to no more than 1 drink a day for nonpregnant women and 2 drinks a day for men. One drink equals 12 oz of beer, 5 oz of wine, or 1 oz of hard liquor. Lifestyle  Follow an exercise program as told by your health care provider.  Maintain a healthy weight. Lose weight if your health care provider says that you need to do that.  Rest when you are tired.  Learn to manage your stress.  Do not use any products that contain nicotine or tobacco, such as cigarettes and e-cigarettes. If you need help quitting, ask your health care provider.  Do not abuse drugs. General instructions  Take over-the-counter and prescription medicines only as told by your health care provider.  Manage other health conditions as told by your health care provider.  Keep all follow-up visits as told by your health care provider. This is important. Contact a health care provider if:  You have chest pain or discomfort. This includes squeezing chest pain that may feel like indigestion (angina).  You have shortness of breath.  You have an irregular heartbeat.  You have unexplained fatigue.  You have unexplained pain or numbness in an arm, leg, or hip.  You have nausea, swelling of your hands or feet, and itchy skin. Get help right away if:  You have any symptoms of a heart attack, such as: ? Chest pain. ? Shortness of breath. ? Pain in your neck, jaw, arms, back, or stomach. ? Cold sweat. ? Nausea. ? Light-headedness.  You have any symptoms of a stroke. "BE FAST" is an easy way to remember the main warning signs of a stroke: ? B - Balance. Signs are dizziness, sudden trouble walking, or loss of balance. ? E - Eyes. Signs are trouble seeing or a sudden change in vision. ? F - Face. Signs are sudden weakness or numbness of the face, or  the face or eyelid drooping on one side. ? A - Arms. Signs are weakness or numbness in an arm. This happens suddenly and usually on one side of the body. ? S - Speech. Signs are sudden trouble speaking, slurred speech, or trouble understanding what people say. ? T - Time. Time to call emergency services. Write down what time symptoms started.  You have other signs of a stroke, such as: ? A sudden, severe headache with no known cause. ? Nausea or vomiting. ? Seizure. These symptoms may represent a serious problem that is an emergency. Do not wait to see if the symptoms will go away. Get medical help right away. Call your local emergency services (911 in the [place] Do not drive yourself to the hospital. Summary  Atherosclerosis is narrowing and hardening of the arteries.  Arteries can become narrow or clogged with a buildup of fat, cholesterol, calcium, and other substances (plaque).  This condition may not cause any symptoms. If you do have symptoms, they are caused by damage to an area of your body that is not getting enough blood.  Treatment may include lifestyle changes and medicines. In some cases, surgery is needed. This information is not intended to replace advice given to you by your health care provider. Make sure you discuss any questions you have with your health care provider. Document Released: 10/01/2003 Document Revised: 10/20/2017 Document Reviewed: 03/16/2017 Elsevier Interactive Patient Education  2019 [employer].  Ankle-Brachial Index Test Why am I having this test? The ankle-brachial index (ABI) test is used to diagnose peripheral vascular disease (PVD). PVD is also known as peripheral arterial disease (PAD). PVD is the blocking or hardening of the arteries anywhere within the circulatory system beyond the heart. PVD is caused by:  Cholesterol deposits in your blood vessels (atherosclerosis). This is the most common cause of this condition.  Irritation and  swelling (inflammation) in the blood vessels.  Blood clots in the vessels. Cholesterol deposits cause arteries to narrow. Normal delivery of oxygen to your tissues is affected, causing muscle pain and fatigue. This is called claudication. PVD means that there may also be a buildup of cholesterol:  In your heart. This increases the risk of heart attacks.  In your brain. This increases the risk of strokes. What is being tested? The ankle-brachial index test measures the blood flow in your arms and legs. The blood flow will show if blood vessels in your legs have been narrowed by cholesterol deposits. How do I prepare for this test?  Wear loose clothing.  Do not use any tobacco products, including cigarettes, chewing tobacco, or e-cigarettes, for at least 30 minutes before the test. What happens during the test?  1. You will lie down in a resting position. 2. Your health care provider will use a blood pressure machine and a small ultrasound device (Doppler) to measure the systolic pressures on your upper arms and ankles. Systolic pressure is the pressure inside your arteries when your heart pumps. 3. Systolic pressure measurements will be taken several times, and at several points, on both the ankle and the arm. 4. Your health care provider will divide the highest systolic pressure of the ankle by the highest systolic pressure of the arm. The result is the ankle-brachial pressure ratio, or ABI. Sometimes this test will be repeated after you have exercised on a treadmill for 5 minutes. You may have leg pain during the exercise portion of the test if you suffer from PVD. If the index number drops after exercise, this may show that PVD is present. How are the results reported? Your test results will be reported as a value that shows the ratio of your ankle pressure to your arm pressure (ABI ratio). Your health care provider will compare your results to normal ranges that were established after  testing a large group of people (reference ranges). Reference ranges may vary among labs and hospitals. For this test, a common reference range is:  ABI ratio of 0.9 to 1.3. What do the results mean? An ABI ratio that is below the reference range is considered abnormal and may indicate PVD in the legs. Talk with your health care provider about what your results mean. Questions to ask your health care provider Ask your health care provider, or the department that is doing the test:  When will my results be ready?  How will I get my results?  What are my treatment options?  What other tests do I need?  What are my next steps? Summary  The ankle-brachial index (ABI) test is used to diagnose peripheral vascular disease (PVD). PVD is also known as peripheral arterial disease (PAD).  The ankle-brachial index test measures the blood flow in your arms and legs.  The highest systolic pressure of the ankle is divided by the highest systolic pressure of the arm. The result is the ABI ratio.  An ABI ratio that is below 0.9 is considered abnormal and may indicate PVD  in the legs. This information is not intended to replace advice given to you by your health care provider. Make sure you discuss any questions you have with your health care provider. Document Released: 07/15/2004 Document Revised: 04/04/2017 Document Reviewed: 04/04/2017 Elsevier Interactive Patient Education  [employer].

## 2018-09-06 NOTE — Progress Notes (Signed)
[facility] History and Physical  Reason for Referral: Gallbladder  Referring Physician:  Dr. Quillian Quince, MD   Chief Complaint    Abdominal Pain      Tara Bright is a 53 y.o. female.  HPI: Tara Bright is a 53 yo who reports onset of pain in the upper abdomen that was stabbing in nature and radiated to her right side/ back that started November 2019.  She says the pain is so bad that she cannot sleep on her right side and notices that the pain is worse if she hutches over and puts pressure on her abdomen.  She says the pain started with eating spicy foods and that she has had some issues with bloating and diarrhea immediately with eating.  She says that since November she has lost about 25 lbs but has gained back about 5-7 lbs eating Ensures.  She also feels like she gets full fast and has some early satiety.  She says that since this pain has started she has been getting anxiety and chest pain at night. During a visit to the ED at [facility], she was scanned form her head to her toe she reports, and was found to have a AAA.  She says that she was told she needed follow up for the AAA, but was not given any more information regarding the CT scans.  She was told there was nothing else wrong with her.   She ultimately had a HIDA scan ordered by her PCP that demonstrates some possible biliary dyskinesia and chronic cholecystitis with delayed filling of the gallbladder and also some delay in the EF but she had received some morphine.   The patient also reports to me during this questioning that she has had some issues with her extremities have different Bps with the right arm having elevated BP compared to the left, and that she now has right eye peripheral vision loss that started when she went to the ED and was pan scanned.  She was not asked or told anything about a mini stroke / TIA.  She describes some leg pain but does not really describe cramping pain with walking.   She has  not had an endoscopy before and reports her mother died from stomach cancer.   Past Medical History:  Diagnosis Date  . Ataxia 2016   and visual changes, left AMA before workup completed  . Hypertension   . Kidney stone   . UTI (urinary tract infection)     Past Surgical History:  Procedure Laterality Date  . CYSTOSCOPY W/ URETERAL STENT PLACEMENT Right 04/05/2017   Procedure: CYSTOSCOPY WITH RETROGRADE PYELOGRAM/URETERAL STENT PLACEMENT;  Surgeon: Lucas Mallow, MD;  Location: [facility];  Service: Urology;  Laterality: Right;  . CYSTOSCOPY/URETEROSCOPY/HOLMIUM LASER/STENT PLACEMENT Right 04/26/2017   Procedure: CYSTOSCOPY/URETEROSCOPY/HOLMIUM LASER/STENT PLACEMENT;  Surgeon: Lucas Mallow, MD;  Location: [facility];  Service: Urology;  Laterality: Right;  . LITHOTRIPSY      Family History  Problem Relation Age of Onset  . Stomach cancer Mother   . Diabetes Father   . Heart failure Father     Social History   Tobacco Use  . Smoking status: Current Every Day Smoker    Packs/day: 0.50    Types: Cigarettes  . Smokeless tobacco: Never Used  Substance Use Topics  . Alcohol use: No  . Drug use: No    Medications: I have reviewed the patient's current medications. Allergies as of 09/06/2018  Reactions   Codeine Other (See Comments)   Gi upset   Other Other (See Comments)   Just about all pain medications cause Nausea and Vomiting. Must take Phenergan before.    Penicillins Hives, Nausea And Vomiting      Medication List       Accurate as of September 06, 2018 11:59 PM. Always use your most recent med list.        cyclobenzaprine 5 MG tablet Commonly known as:  FLEXERIL Take 5 mg by mouth 3 (three) times daily as needed for muscle spasms.   lisinopril 10 MG tablet Commonly known as:  PRINIVIL,ZESTRIL Take 10 mg by mouth daily.        ROS:  A comprehensive review of systems was negative except for: Eyes: positive for blurred vision, lost peripheral  vision in right eye Cardiovascular: positive for chest pain and varicose veins Gastrointestinal: positive for abdominal pain Genitourinary: positive for frequency Musculoskeletal: positive for back pain Neurological: positive for dizziness and numbness Endocrine: positive for temperature intolerance and tired sluggish  Blood pressure (!) 178/92, pulse 82, temperature 98.9 F (37.2 C), temperature source Temporal, resp. rate 20, weight 119 lb 12.8 oz (54.3 kg).  Right arm BP 172/92; Left arm BP 139/85 Physical Exam Constitutional:      Appearance: She is well-developed.  HENT:     Head: Normocephalic and atraumatic.  Eyes:     Extraocular Movements: Extraocular movements intact.  Cardiovascular:     Rate and Rhythm: Normal rate and regular rhythm.     Pulses:          Carotid pulses are on the left side with bruit.      Femoral pulses are 2+ on the right side and 1+ on the left side.      Dorsalis pedis pulses are 0 on the right side and 2+ on the left side.       Posterior tibial pulses are 1+ on the right side and 2+ on the left side.     Comments: Left toes with decreased capillary refill > 4seconds  Pulmonary:     Effort: Pulmonary effort is normal.     Breath sounds: Normal breath sounds.  Abdominal:     General: Abdomen is flat. There is no distension.     Palpations: Abdomen is soft.     Tenderness: There is abdominal tenderness in the right upper quadrant, epigastric area and periumbilical area.     Comments: Can feel the pulsations of the aorta but patient is very thin  Musculoskeletal:     Right lower leg: No edema.     Left lower leg: No edema.  Skin:    General: Skin is warm and dry.  Neurological:     General: No focal deficit present.     Mental Status: She is alert and oriented to person, place, and time.  Psychiatric:        Mood and Affect: Mood normal.        Behavior: Behavior normal.     Results:                   Assessment &  Plan:  Tara Bright is a 53 y.o. female with multiple issues going on including likely some chronic cholecystitis symptoms versus biliary dyskinesia but more pressing it sounds to me that she may have had some TIA symptoms and concern for carotid bruit on the left and CT report of some calcifications in the carotid.  She also has a AAA and advanced aortoiliac disease and left sided pulses that are greatly diminished compared to the right with delayed capillary refill.  She also has a BP differential between her upper extremities which again is concerning. She has referral to a cardiologist in the upcoming weeks but really needs to be seen by Vascular Surgery.   -Follow up made with Dr. Donnetta Hutching for Monday with ABI and Carotid US prior  -Continue follow up with Cardiology as patient has been having some chest pain at night and has known severe atherosclerosis -Following these visits we can determine future need/ plan for any gallbladder surgery   All questions were answered to the satisfaction of the patient.  Virl Cagey 09/08/2018, 9:06 PM

## 2018-09-07 ENCOUNTER — Other Ambulatory Visit: Payer: Self-pay

## 2018-09-07 DIAGNOSIS — R0989 Other specified symptoms and signs involving the circulatory and respiratory systems: Secondary | ICD-10-CM

## 2018-09-07 DIAGNOSIS — I739 Peripheral vascular disease, unspecified: Secondary | ICD-10-CM

## 2018-09-08 ENCOUNTER — Encounter: Payer: Self-pay | Admitting: General Surgery

## 2018-09-10 ENCOUNTER — Ambulatory Visit (HOSPITAL_COMMUNITY)
Admission: RE | Admit: 2018-09-10 | Discharge: 2018-09-10 | Disposition: A | Discharge status: Home / Self Care | Payer: Self-pay | Source: Ambulatory Visit | Attending: Vascular Surgery | Admitting: Vascular Surgery

## 2018-09-10 ENCOUNTER — Encounter: Payer: Self-pay | Admitting: Vascular Surgery

## 2018-09-10 ENCOUNTER — Ambulatory Visit (INDEPENDENT_AMBULATORY_CARE_PROVIDER_SITE_OTHER): Payer: Self-pay | Admitting: Vascular Surgery

## 2018-09-10 VITALS — BP 183/98 | HR 83 | Temp 98.7°F | Resp 18 | Wt 120.6 lb

## 2018-09-10 DIAGNOSIS — I714 Abdominal aortic aneurysm, without rupture, unspecified: Secondary | ICD-10-CM

## 2018-09-10 DIAGNOSIS — R0989 Other specified symptoms and signs involving the circulatory and respiratory systems: Secondary | ICD-10-CM

## 2018-09-10 DIAGNOSIS — I739 Peripheral vascular disease, unspecified: Secondary | ICD-10-CM

## 2018-09-10 NOTE — Progress Notes (Signed)
[facility]ist of [facility] office  Patient name: Tara Bright MRN: [id number] DOB: 12/12/1965 Sex: female  REASON FOR CONSULT: Evaluation of diffuse peripheral vascular disease including abdominal aortic aneurysm, possible amaurosis fugax and initial pulses left lower extremity  HPI: Tara Bright is a 53 y.o. female, who is today for further evaluation.  She has several complaints.  She was having difficulty with abdominal pain.  CT scan revealed aortoiliac calcification and also a small infrarenal abdominal aortic aneurysm at 3.2 cm.  She is here for further discussion.  She reports that her abdominal pain is epigastric.  It can be exacerbated by positioning and by lifting or straining.  Also reports episode of loss of lateral and superior peripheral vision on January 8.  This was immediate and she has had no resolution of this.  She has not seen an eye doctor.  She was found to have diminished pedal pulses but does not have a clear claudication history.  She reports generalized fatigue.  Have a history of renal stones in the past.  Long-term cigarette smoker but is attempting to stop this  Past Medical History:  Diagnosis Date  . Ataxia 2016   and visual changes, left AMA before workup completed  . Hypertension   . Kidney stone   . UTI (urinary tract infection)     Family History  Problem Relation Age of Onset  . Stomach cancer Mother   . Diabetes Father   . Heart failure Father     SOCIAL HISTORY: Social History   Socioeconomic History  . Marital status: Married    Spouse name: Not on file  . Number of children: Not on file  . Years of education: Not on file  . Highest education level: Not on file  Occupational History  . Not on file  Social Needs  . Financial resource strain: Not on file  . Food insecurity:    Worry: Not on file    Inability: Not on file  . Transportation needs:    Medical: Not on  file    Non-medical: Not on file  Tobacco Use  . Smoking status: Current Every Day Smoker    Packs/day: 0.50    Types: Cigarettes  . Smokeless tobacco: Never Used  Substance and Sexual Activity  . Alcohol use: No  . Drug use: No  . Sexual activity: Yes    Birth control/protection: None  Lifestyle  . Physical activity:    Days per week: Not on file    Minutes per session: Not on file  . Stress: Not on file  Relationships  . Social connections:    Talks on phone: Not on file    Gets together: Not on file    Attends religious service: Not on file    Active member of club or organization: Not on file    Attends meetings of clubs or organizations: Not on file    Relationship status: Not on file  . Intimate partner violence:    Fear of current or ex partner: Not on file    Emotionally abused: Not on file    Physically abused: Not on file    Forced sexual activity: Not on file  Other Topics Concern  . Not on file  Social History Narrative  . Not on file    Allergies  Allergen Reactions  . Codeine Other (See Comments)    Gi upset  . Other Other (See Comments)  Just about all pain medications cause Nausea and Vomiting. Must take Phenergan before.   Marland Kitchen Penicillins Hives and Nausea And Vomiting    Current Outpatient Medications  Medication Sig Dispense Refill  . cyclobenzaprine (FLEXERIL) 5 MG tablet Take 5 mg by mouth 3 (three) times daily as needed for muscle spasms.    Marland Kitchen lisinopril (PRINIVIL,ZESTRIL) 10 MG tablet Take 10 mg by mouth daily.     No current facility-administered medications for this visit.     REVIEW OF SYSTEMS:  [X]  denotes positive finding, [ ]  denotes negative finding Cardiac  Comments:  Chest pain or chest pressure:    Shortness of breath upon exertion:    Short of breath when lying flat:    Irregular heart rhythm:        Vascular    Pain in calf, thigh, or hip brought on by ambulation: x   Pain in feet at night that wakes you up from your  sleep:  x   Blood clot in your veins:    Leg swelling:         Pulmonary    Oxygen at home:    Productive cough:     Wheezing:         Neurologic    Sudden weakness in arms or legs:     Sudden numbness in arms or legs:  x   Sudden onset of difficulty speaking or slurred speech:    Temporary loss of vision in one eye:  x   Problems with dizziness:  x       Gastrointestinal    Blood in stool:     Vomited blood:         Genitourinary    Burning when urinating:     Blood in urine:        Psychiatric    Major depression:         Hematologic    Bleeding problems:    Problems with blood clotting too easily:        Skin    Rashes or ulcers:        Constitutional    Fever or chills:      PHYSICAL EXAM: Vitals:   09/10/18 1525 09/10/18 1528  BP: (!) 156/94 (!) 183/98  Pulse: 85 83  Resp: 18   Temp: 98.7 F (37.1 C)   TempSrc: Temporal   Weight: 120 lb 9.6 oz (54.7 kg)     GENERAL: The patient is a well-nourished female, in no acute distress. The vital signs are documented above. CARDIOVASCULAR: I do not hear carotid bruit today.  Have 2+ radial pulses.  2+ femoral and 2+ popliteal pulses.  She has 2+ posterior tibial pulses bilaterally.  Faint left dorsalis pedis and 2+ right dorsalis pedis pulse PULMONARY: There is good air exchange  ABDOMEN: Soft with diffuse tenderness to palpation.  She is thin and has a prominent aortic pulsation MUSCULOSKELETAL: There are no major deformities or cyanosis. NEUROLOGIC: No focal weakness or paresthesias are detected. SKIN: There are no ulcers or rashes noted. PSYCHIATRIC: The patient has a normal affect.  DATA:  [facility].  Reveals calcification of her aortoiliac segments.  She does have a 3.2 cm infrarenal abdominal aortic aneurysm with some mural thrombus. .  This to a CT scan for renal stones without contrast from September 2018.  Although there was no comment by the interpreting radiologist, she  does have a 3.2 cm aneurysm with the exact same dimensions  as her current study.   Duplex today reveals no evidence of carotid stenosis bilaterally  Noninvasive lower studies reveal ankle arm index of 0.97 on the right and 0.88 on the left  MEDICAL ISSUES: I had a long discussion with the patient and her son regarding issues.  I congratulated on her on diminishing her smoking but explained the critical importance of absolute smoking cessation.  Not see any carotid disease that would explain her visual changes.  I explained that her aortic diameter was slightly enlarged but has not changed in 18 months since the most recent CT scan.  Recommended that we see her again in 2 years with ultrasound to rule out any change.   Rosetta Posner, MD FACS [facility] Tel [phone] Pager [phone]

## 2018-09-19 ENCOUNTER — Encounter: Payer: Self-pay | Admitting: General Surgery

## 2018-09-20 ENCOUNTER — Ambulatory Visit: Payer: Self-pay | Admitting: Cardiovascular Disease

## 2018-10-15 ENCOUNTER — Telehealth: Payer: Self-pay | Admitting: Cardiovascular Disease

## 2018-10-15 NOTE — Telephone Encounter (Signed)
I called and spoke with the patient about potentially rescheduling her appointment given the [place] pandemic.  She told me that she saw her general surgeon and she needs gallbladder surgery and is required to see me prior to having this performed.  She denies any cardiac symptoms per se.  We will go ahead and keep her appointment for this upcoming Thursday.

## 2018-10-17 ENCOUNTER — Encounter: Payer: Self-pay | Admitting: *Deleted

## 2018-10-17 ENCOUNTER — Telehealth: Payer: Self-pay | Admitting: Cardiovascular Disease

## 2018-10-17 NOTE — Telephone Encounter (Signed)
° ° °  COVID-19 Pre-Screening Questions:   Do you currently have a fever?  (yes = cancel and refer to pcp for e-visit)  No   Have you recently travelled on a cruise, internationally, or to [place], [place], [place], [place], [place], or [place], [place] [employer]) ?  (yes = cancel, stay home, monitor symptoms, and contact pcp or initiate e-visit if symptoms develop) no  Have you been in contact with someone that is currently pending confirmation of Covid19 testing or has been confirmed to have the [place] virus? (yes = cancel, stay home, away from tested individual, monitor symptoms, and contact pcp or initiate e-visit if symptoms develop) no   Are you currently experiencing fatigue or cough? No (yes = pt should be prepared to have a mask placed at the time of their visit). No

## 2018-10-18 ENCOUNTER — Encounter: Payer: Self-pay | Admitting: Cardiovascular Disease

## 2018-10-18 ENCOUNTER — Ambulatory Visit: Payer: Self-pay | Admitting: Cardiovascular Disease

## 2018-10-18 ENCOUNTER — Other Ambulatory Visit: Payer: Self-pay

## 2018-10-18 VITALS — BP 165/99 | HR 94 | Temp 98.0°F | Ht 63.0 in | Wt 119.0 lb

## 2018-10-18 DIAGNOSIS — R0989 Other specified symptoms and signs involving the circulatory and respiratory systems: Secondary | ICD-10-CM

## 2018-10-18 DIAGNOSIS — Z01818 Encounter for other preprocedural examination: Secondary | ICD-10-CM

## 2018-10-18 DIAGNOSIS — I7090 Unspecified atherosclerosis: Secondary | ICD-10-CM

## 2018-10-18 DIAGNOSIS — R209 Unspecified disturbances of skin sensation: Secondary | ICD-10-CM

## 2018-10-18 DIAGNOSIS — Z8673 Personal history of transient ischemic attack (TIA), and cerebral infarction without residual deficits: Secondary | ICD-10-CM

## 2018-10-18 DIAGNOSIS — Z8249 Family history of ischemic heart disease and other diseases of the circulatory system: Secondary | ICD-10-CM

## 2018-10-18 DIAGNOSIS — I10 Essential (primary) hypertension: Secondary | ICD-10-CM

## 2018-10-18 MED ORDER — ATORVASTATIN CALCIUM 20 MG PO TABS: 20.0000 mg | ORAL_TABLET | Freq: Every day | ORAL | 6 refills | Status: DC

## 2018-10-18 MED ORDER — ASPIRIN EC 81 MG PO TBEC: 81.0000 mg | DELAYED_RELEASE_TABLET | Freq: Every day | ORAL | Status: DC

## 2018-10-18 NOTE — Progress Notes (Signed)
CARDIOLOGY CONSULT NOTE  Patient ID: Tara Bright MRN: [id number] DOB/AGE: [birth year] 53 y.o.  Admit date: (Not on file) Primary Physician: Caryl Bis, MD Referring Physician: Allwardt, Alyssa, PA  Reason for Consultation: Preoperative stratification, chest pain  HPI: Tara Bright is a 53 y.o. female who is being seen today for the evaluation of chest pain and Preoperative risk stratification at the request of Allwardt, Alyssa, PA.   I reviewed notes from her PCP as well as the EMR.  She has abdominal pain.  CT scan showed a small infrarenal abdominal aortic aneurysm measuring 3.2 cm for which she saw vascular surgery.  Carotid Dopplers on 09/10/2018 showed less than 50% stenosis bilaterally.  She saw a general surgeon most recently on 09/06/2018.  HIDA scan showed biliary dyskinesia and chronic cholecystitis.  General surgery office notes mention that the patient has been experiencing chest pain.  I reviewed labs dated 08/01/2018: Sodium 141, potassium 4.3, BUN 7, creatinine 0.75, magnesium 1.9, hemoglobin 15.9, platelets 259.  Blood pressure in the ED at [facility] was 187/97.  She does have a history of hypertension.  Abdominal CT mention "advanced coronary atherosclerosis for age ".  Upon speaking with her, she denies exertional chest pain and lifestyle limiting exertional dyspnea.  She can climb a flight of stairs without difficulty.  She has 14 steps at home.  She denies orthopnea and paroxysmal nocturnal dyspnea.  She denies leg swelling.  She said both feet feel cold and are paler than her legs.  Her primary complaints relate to right upper quadrant abdominal pain which can radiate to her back.  She has occasional palpitations.  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  She used to smoke a pack of cigarettes daily but quit 2 weeks ago.  She has had 2 episodes of acid reflux disease which caused some chest pain while lying down at  night.  She denies a history of anxiety and panic attacks.  She has a history of a TIA.  She describes what sounds like right eye amaurosis fugax.  She had been on aspirin 81 mg but stopped taking this on her own sometime back.  ECG performed in the office today which I ordered and personally interpreted demonstrated sinus rhythm with mild nonspecific ST segment abnormalities.  Family history: Father had four-vessel CABG and died at the age of 48.  Mother died of stomach cancer at age 64.  Brother died of complications related to diabetes at the age of 99.  Sister had an MI at age 47.  Another brother died of possibly CHF at age 62.    Allergies  Allergen Reactions  . Codeine Other (See Comments)    Gi upset  . Other Other (See Comments)    Just about all pain medications cause Nausea and Vomiting. Must take Phenergan before.   Marland Kitchen Penicillins Hives and Nausea And Vomiting    Current Outpatient Medications  Medication Sig Dispense Refill  . cyclobenzaprine (FLEXERIL) 5 MG tablet Take 5 mg by mouth 3 (three) times daily as needed for muscle spasms.    Marland Kitchen lisinopril (PRINIVIL,ZESTRIL) 10 MG tablet Take 10 mg by mouth daily.     No current facility-administered medications for this visit.     Past Medical History:  Diagnosis Date  . Abdominal aortic aneurysm (AAA) ([place])   . Abdominal pain   . Abnormal weight loss   . Ataxia 2016   and visual changes,  left AMA before workup completed  . CAD (coronary atherosclerotic disease)   . Hypertension   . Kidney stone   . Loss of appetite   . Nicotine dependence   . Right upper quadrant pain   . UTI (urinary tract infection)     Past Surgical History:  Procedure Laterality Date  . CYSTOSCOPY W/ URETERAL STENT PLACEMENT Right 04/05/2017   Procedure: CYSTOSCOPY WITH RETROGRADE PYELOGRAM/URETERAL STENT PLACEMENT;  Surgeon: Lucas Mallow, MD;  Location: [facility];  Service: Urology;  Laterality: Right;  . CYSTOSCOPY/URETEROSCOPY/HOLMIUM  LASER/STENT PLACEMENT Right 04/26/2017   Procedure: CYSTOSCOPY/URETEROSCOPY/HOLMIUM LASER/STENT PLACEMENT;  Surgeon: Lucas Mallow, MD;  Location[facility];  Service: Urology;  Laterality: Right;  . LITHOTRIPSY      Social History   Socioeconomic History  . Marital status: Married    Spouse name: Not on file  . Number of children: Not on file  . Years of education: Not on file  . Highest education level: Not on file  Occupational History  . Not on file  Social Needs  . Financial resource strain: Not on file  . Food insecurity:    Worry: Not on file    Inability: Not on file  . Transportation needs:    Medical: Not on file    Non-medical: Not on file  Tobacco Use  . Smoking status: Former Smoker    Packs/day: 0.50    Types: Cigarettes    Last attempt to quit: 10/04/2018    Years since quitting: 0.0  . Smokeless tobacco: Never Used  Substance and Sexual Activity  . Alcohol use: No  . Drug use: No  . Sexual activity: Yes    Birth control/protection: None  Lifestyle  . Physical activity:    Days per week: Not on file    Minutes per session: Not on file  . Stress: Not on file  Relationships  . Social connections:    Talks on phone: Not on file    Gets together: Not on file    Attends religious service: Not on file    Active member of club or organization: Not on file    Attends meetings of clubs or organizations: Not on file    Relationship status: Not on file  . Intimate partner violence:    Fear of current or ex partner: Not on file    Emotionally abused: Not on file    Physically abused: Not on file    Forced sexual activity: Not on file  Other Topics Concern  . Not on file  Social History Narrative  . Not on file      Current Meds  Medication Sig  . cyclobenzaprine (FLEXERIL) 5 MG tablet Take 5 mg by mouth 3 (three) times daily as needed for muscle spasms.  Marland Kitchen lisinopril (PRINIVIL,ZESTRIL) 10 MG tablet Take 10 mg by mouth daily.      Review of  systems complete and found to be negative unless listed above in HPI    Physical exam Blood pressure (!) 165/99, pulse 94, height 5\' 3"  (1.6 m), weight 119 lb (54 kg), SpO2 100 %. General: NAD Neck: No JVD, no thyromegaly or thyroid nodule.  Lungs: Clear to auscultation bilaterally with normal respiratory effort. CV: Nondisplaced PMI. Regular rate and rhythm, normal S1/S2, no S3/S4, no murmur.  No peripheral edema.  Both feet are cool to palpation.  Pulses are normal.  Left carotid bruit.    Abdomen: Soft, diffuse abdominal tenderness, no rebound/guarding/rigidity, no distention.  Skin: Intact without lesions or rashes.  Neurologic: Alert and oriented x 3.  Psych: Normal affect. Extremities: No clubbing or cyanosis.  HEENT: Normal.   ECG: Most recent ECG reviewed.   Labs: Lab Results  Component Value Date/Time   K 4.0 04/21/2017 09:59 AM   BUN 12 04/21/2017 09:59 AM   CREATININE 0.74 04/21/2017 09:59 AM   ALT 12 (L) 04/05/2017 10:54 AM   HGB 14.2 04/21/2017 09:59 AM     Lipids: No results found for: LDLCALC, LDLDIRECT, CHOL, TRIG, HDL      ASSESSMENT AND PLAN:  1.  Chest pain/atherosclerosis: Symptoms of chest pain are atypical for ischemic heart disease and appear more related to GERD, exacerbated by gallbladder disease.  She is able to climb a flight of stairs without exertional symptoms and thus can achieve 4 METS.  Risk factors for coronary artery disease include hypertension, history of tobacco use, and family history of heart disease.  She is unable to do an exercise tolerance test at this time due to significant abdominal pain.  I would consider this in the future. I do not feel this is required prior to surgery.  I will order a 2-D echocardiogram with Doppler to evaluate cardiac structure, function, and regional wall motion. I will start aspirin 81 mg and atorvastatin 20 mg. I will also check lipids.  2.  Preoperative risk stratification: See discussion in #1.  I do  not feel she requires an ischemic evaluation prior to surgery. She is able to climb a flight of stairs without exertional symptoms and thus can achieve 4 METS.  She can proceed with an acceptable level of risk.  I am starting aspirin 81 mg and atorvastatin 20 mg.  3.  Hypertension: Blood pressure is significantly elevated.  She currently takes lisinopril 10 mg daily.  She says this is being exacerbated by abdominal pain.  This will need further monitoring.  4.  History of TIA: She had been on aspirin 81 mg but discontinued this on her own.  I will start aspirin 81 mg and atorvastatin 20 mg given her concomitant atherosclerosis.  5.  Left carotid bruit: No significant carotid artery stenosis by Dopplers on 09/10/2018.  6.  Cold feet with tingling/numbness: She has palpable pulses and no symptoms of claudication.   Disposition: Follow up in 3 months  Signed: Kate Sable, M.D., F.A.C.C.  10/18/2018, 10:00 AM

## 2018-10-18 NOTE — Patient Instructions (Addendum)
Medication Instructions:   Begin Aspirin 81mg  daily.  Begin Lipitor 20mg  daily.  Continue all other medications.    Labs:  Lipids - order given today.   Reminder:  Nothing to eat or drink after 12 midnight prior to labs.  Office will contact with results via phone or letter.    Testing/Procedures:  Your physician has requested that you have an echocardiogram. Echocardiography is a painless test that uses sound waves to create images of your heart. It provides your doctor with information about the size and shape of your heart and how well your heart's chambers and valves are working. This procedure takes approximately one hour. There are no restrictions for this procedure.  Office will call to schedule as soon as testing opens up again due to [place]: 3 months   Any Other Special Instructions Will Be Listed Below (If Applicable).  If you need a refill on your cardiac medications before your next appointment, please call your pharmacy.

## 2018-10-24 ENCOUNTER — Encounter: Payer: Self-pay | Admitting: General Surgery

## 2018-10-25 ENCOUNTER — Telehealth: Payer: Self-pay | Admitting: Cardiovascular Disease

## 2018-10-25 ENCOUNTER — Telehealth: Payer: Self-pay | Admitting: Emergency Medicine

## 2018-10-25 NOTE — Telephone Encounter (Signed)
patiet callled and stated she saw cardiology and was cleared to have surgery. She stated she is very symptomatic. She is vomiting, losing weight, and having bloody  stools. She stated her pcp and cardiologist are aware and she really wants her gallbladder removed. I asked mr Holzworth due to the corona virus would she be willing to have a virtual visit in her my chart if she is able to have surgery. I notified the patient with we are able to schedule this I will let her know and  I notified doctor bridges.

## 2018-10-25 NOTE — Telephone Encounter (Signed)
Called and left message with patient in regards to scheduling her echo ordered by Dr.Koneswaran. She is going to be having gallbladder surgery in the near future.

## 2018-10-29 NOTE — Telephone Encounter (Signed)
Pt aware that echocardiogram could wait per LOV notes until closer to time for her July appt. Also aware that she could postpone lab work unitl closer to her July appt. Would like to go ahead and get echo scheduled for late June - will forward to to schedulers

## 2018-10-29 NOTE — Telephone Encounter (Signed)
Patient called - she does not want to schedule test until this COVID pandemic is cleared    She has question about if lab work that has been ordered, If she really needs to do it at this time

## 2018-12-06 ENCOUNTER — Encounter: Payer: Self-pay | Admitting: General Surgery

## 2018-12-06 ENCOUNTER — Ambulatory Visit (INDEPENDENT_AMBULATORY_CARE_PROVIDER_SITE_OTHER): Payer: Self-pay | Admitting: General Surgery

## 2018-12-06 ENCOUNTER — Other Ambulatory Visit: Payer: Self-pay

## 2018-12-06 VITALS — BP 167/90 | HR 86 | Temp 99.6°F | Resp 16 | Ht 63.0 in | Wt 120.0 lb

## 2018-12-06 DIAGNOSIS — K811 Chronic cholecystitis: Secondary | ICD-10-CM

## 2018-12-06 NOTE — Progress Notes (Signed)
[facility] History and Physical  Reason for Referral: Gallbladder  Referring Physician: Dr. Quillian Quince, MD   Chief Complaint    Pre-op Exam      Tara Bright is a 53 y.o. female.  HPI: Tara Bright is a 53 yo well known to me who I saw back February 2020 with onset of RUQ pain since November 2019 with associated nausea, weight loss of 25 lbs reported, and radiation of the pain to the right side/ back. She said the pain was worse with spicy foods and she also has issues with bloating and changing between diarrhea and constipation.  She has been seen at [facility] and had a workup done due to coming in with anxiety and chest pain. They worked her up from "head to toe" and she was found to have an AAA and that she need to follow up. She ultimately had a HIDA scan ordered by her PCP that demonstrated biliary dyskinsia and some chronic cholecystitis.   During my exam she was having multiple issues in February and there was some concerning findings with her reporting loss of peripheral vision in her right eye and I thought I possibly heard a bruit on her carotid artery.  She was ultimately referred to vascular surgery, Dr. Donnetta Hutching, and a carotid duplex was ordered as well as ABI. She had < 50% stenosis of the carotids and mildly depressed ABIs.  Dr. Donnetta Hutching planned to follow up AAA in 2 years with a [place].  She also was seen by Cardiology Dr. Bronson Ing due to the chest pain, and her symptoms were not felt to be cardiac in nature and no stress test was recommended. He does plan to do a ECHO but did not feel that this needed to be done prior to her surgery for her gallbladder as she was an acceptable level of risk.   Some returns today with continued symptoms.   Past Medical History:  Diagnosis Date  . Abdominal aortic aneurysm (AAA) ([place])   . Abdominal pain   . Abnormal weight loss   . Ataxia 2016   and visual changes, left AMA before workup completed  . CAD (coronary atherosclerotic  disease)   . Hypertension   . Kidney stone   . Loss of appetite   . Nicotine dependence   . Right upper quadrant pain   . UTI (urinary tract infection)     Past Surgical History:  Procedure Laterality Date  . CYSTOSCOPY W/ URETERAL STENT PLACEMENT Right 04/05/2017   Procedure: CYSTOSCOPY WITH RETROGRADE PYELOGRAM/URETERAL STENT PLACEMENT;  Surgeon: Lucas Mallow, MD;  Location: [facility];  Service: Urology;  Laterality: Right;  . CYSTOSCOPY/URETEROSCOPY/HOLMIUM LASER/STENT PLACEMENT Right 04/26/2017   Procedure: CYSTOSCOPY/URETEROSCOPY/HOLMIUM LASER/STENT PLACEMENT;  Surgeon: Lucas Mallow, MD;  Location: [facility];  Service: Urology;  Laterality: Right;  . LITHOTRIPSY      Family History  Problem Relation Age of Onset  . Stomach cancer Mother   . Diabetes Father   . Heart failure Father     Social History   Tobacco Use  . Smoking status: Former Smoker    Packs/day: 0.50    Types: Cigarettes    Last attempt to quit: 10/04/2018    Years since quitting: 0.1  . Smokeless tobacco: Never Used  Substance Use Topics  . Alcohol use: No  . Drug use: No    Medications: I have reviewed the patient's current medications. Allergies as of 12/06/2018      Reactions  Codeine Other (See Comments)   Gi upset   Other Other (See Comments)   Just about all pain medications cause Nausea and Vomiting. Must take Phenergan before.    Penicillins Hives, Nausea And Vomiting      Medication List       Accurate as of Dec 06, 2018  2:20 PM. If you have any questions, ask your nurse or doctor.        aspirin EC 81 MG tablet Take 1 tablet (81 mg total) by mouth daily.   atorvastatin 20 MG tablet Commonly known as:  LIPITOR Take 1 tablet (20 mg total) by mouth daily.   cyclobenzaprine 5 MG tablet Commonly known as:  FLEXERIL Take 5 mg by mouth 3 (three) times daily as needed for muscle spasms.   lisinopril 10 MG tablet Commonly known as:  ZESTRIL Take 10 mg by mouth daily.    ondansetron 4 MG tablet Commonly known as:  ZOFRAN Take 4 mg by mouth every 8 (eight) hours as needed for nausea or vomiting.   traMADol 100 MG 24 hr tablet Commonly known as:  ULTRAM-ER Take 100 mg by mouth daily as needed for pain.        ROS:  A comprehensive review of systems was negative except for: Gastrointestinal: positive for abdominal pain, constipation, diarrhea and nausea  Blood pressure (!) 167/90, pulse 86, temperature 99.6 F (37.6 C), temperature source Temporal, resp. rate 16, height 5\' 3"  (1.6 m), weight 120 lb (54.4 kg), SpO2 98 %. Physical Exam Vitals signs reviewed.  Constitutional:      Appearance: Normal appearance.  HENT:     Head: Normocephalic.     Nose: Nose normal.     Mouth/Throat:     Mouth: Mucous membranes are moist.  Eyes:     Extraocular Movements: Extraocular movements intact.     Pupils: Pupils are equal, round, and reactive to light.  Neck:     Musculoskeletal: Normal range of motion.  Cardiovascular:     Rate and Rhythm: Normal rate and regular rhythm.  Pulmonary:     Effort: Pulmonary effort is normal.     Breath sounds: Normal breath sounds.  Abdominal:     General: Abdomen is flat. There is no distension.     Palpations: Abdomen is soft.     Tenderness: There is abdominal tenderness in the right upper quadrant.  Musculoskeletal: Normal range of motion.        General: No swelling.  Skin:    General: Skin is warm and dry.  Neurological:     General: No focal deficit present.     Mental Status: She is alert and oriented to person, place, and time.  Psychiatric:        Mood and Affect: Mood normal.        Behavior: Behavior normal.        Thought Content: Thought content normal.        Judgment: Judgment normal.     Results: Carotid 08/2018 IMPRESSION: 1. Bilateral carotid bifurcation plaque resulting in less than 50% diameter stenosis. 2. Antegrade bilateral vertebral arterial flow.  ABI 08/2018 IMPRESSION: Mildly  depressed left ankle-brachial index at rest with nearly monophasic dorsalis pedis waveform. Resting right ankle-brachial index is nearly normal with essentially normal distal waveforms.  OSH HIDA EF 29%, ? Chronic cholecystitis with delayed filling with tracer only after morphine   Assessment & Plan:  Tara Bright is a 53 y.o. female with chronic cholecystitis and continued  symptoms. We have discussed that all of her symptoms may not be from the gallbladder and that if she continued to have issues she would need to see GI for possible EGD and possible treatment of IBS (diarrhea/ constipation alternating).   -PLAN: I counseled the patient about the indication, risks and benefits of laparoscopic cholecystectomy.  She understands there is a very small chance for bleeding, infection, injury to normal structures (including common bile duct), conversion to open surgery, persistent symptoms, evolution of postcholecystectomy diarrhea, need for secondary interventions, anesthesia reaction, cardiopulmonary issues and other risks not specifically detailed here. I described the expected recovery, the plan for follow-up and the restrictions during the recovery phase.  All questions were answered.  -We discussed the current COVID 19 pandemic and COVID 19 testing prior to any elective cases. We discussed isolation after her COVID 19 testing.   All questions were answered to the satisfaction of the patient.  After careful consideration, Tara Bright has decided to proceed.    Virl Cagey 12/06/2018, 2:20 PM

## 2018-12-06 NOTE — Patient Instructions (Signed)
Cholecystitis  Cholecystitis is inflammation of the gallbladder. It is often called a gallbladder attack. The gallbladder is a pear-shaped organ that lies beneath the liver on the right side of the body. The gallbladder stores bile, which is a fluid that helps the body digest fats. If bile builds up in your gallbladder, your gallbladder becomes inflamed. This condition may occur suddenly. Cholecystitis is a serious condition and requires treatment. What are the causes? The most common cause of this condition is gallstones. Gallstones can block the tube (duct) that carries bile out of your gallbladder. This causes bile to build up. Other causes include:  Damage to the gallbladder due to a decrease in blood flow.  Infections in the bile ducts.  Scars or kinks in the bile ducts.  Tumors in the liver, pancreas, or gallbladder. What increases the risk? You are more likely to develop this condition if:  You have sickle cell disease.  You take birth control pills or use estrogen.  You have alcoholic liver disease.  You have liver cirrhosis.  You have your nutrition delivered through a vein (parenteral nutrition).  You are critically ill.  You do not eat or drink for a long time. This is also called "fasting."  You are obese.  You lose weight too fast.  You are pregnant.  You have high levels of fat (triglycerides) in the blood.  You have pancreatitis. What are the signs or symptoms? Symptoms of this condition include:  Pain in the abdomen, especially in the upper right area of the abdomen.  Tenderness or bloating in the abdomen.  Nausea.  Vomiting.  Fever.  Chills. How is this diagnosed? This condition is diagnosed with a medical history and physical exam. You may also have other tests, including:  Imaging tests, such as: ? An ultrasound of the gallbladder. ? A CT scan of the abdomen. ? A gallbladder nuclear scan (HIDA scan). This scan allows your health care  provider to see the bile moving from your liver to your gallbladder and on to your small intestine. ? MRI.  Blood tests, such as: ? A complete blood count. The white blood cell count may be higher than normal. ? Liver function tests. Certain types of gallstones cause some results to be higher than normal. How is this treated? Treatment may include:  Surgery to remove your gallbladder (cholecystectomy).  Antibiotic medicine, usually through an IV.  Fasting for a certain amount of time.  Giving IV fluids.  Medicine to treat pain or vomiting. Follow these instructions at home:  If you had surgery, follow instructions from your health care provider about home care after the procedure. Medicines   Take over-the-counter and prescription medicines only as told by your health care provider.  If you were prescribed an antibiotic medicine, take it as told by your health care provider. Do not stop taking the antibiotic even if you start to feel better. General instructions  Follow instructions from your health care provider about what to eat or drink. When you are allowed to eat, avoid eating or drinking anything that triggers your symptoms.  Do not lift anything that is heavier than 10 lb (4.5 kg), or the limit that you are told, until your health care provider says that it is safe.  Do not use any products that contain nicotine or tobacco, such as cigarettes and e-cigarettes. If you need help quitting, ask your health care provider.  Keep all follow-up visits as told by your health care provider. This is   important. Contact a health care provider if:  Your pain is not controlled with medicine.  You have a fever. Get help right away if:  Your pain moves to another part of your abdomen or to your back.  You continue to have symptoms or you develop new symptoms even with treatment. Summary  Cholecystitis is inflammation of the gallbladder.  The most common cause of this condition  is gallstones. Gallstones can block the tube (duct) that carries bile out of your gallbladder.  Common symptoms are pain in the abdomen, nausea, vomiting, fever, and chills.  This condition is treated with surgery to remove the gallbladder, medicines, fasting, and IV fluids.  Follow your health care provider's instructions for eating and drinking. Avoid eating anything that triggers your symptoms. This information is not intended to replace advice given to you by your health care provider. Make sure you discuss any questions you have with your health care provider. Document Released: 07/11/2005 Document Revised: 11/17/2017 Document Reviewed: 11/17/2017 Elsevier Interactive Patient Education  2019 [employer]   Laparoscopic Cholecystectomy Laparoscopic cholecystectomy is surgery to remove the gallbladder. The gallbladder is a pear-shaped organ that lies beneath the liver on the right side of the body. The gallbladder stores bile, which is a fluid that helps the body to digest fats. Cholecystectomy is often done for inflammation of the gallbladder (cholecystitis). This condition is usually caused by a buildup of gallstones (cholelithiasis) in the gallbladder. Gallstones can block the flow of bile, which can result in inflammation and pain. In severe cases, emergency surgery may be required. This procedure is done though small incisions in your abdomen (laparoscopic surgery). A thin scope with a camera (laparoscope) is inserted through one incision. Thin surgical instruments are inserted through the other incisions. In some cases, a laparoscopic procedure may be turned into a type of surgery that is done through a larger incision (open surgery). Tell a health care provider about:  Any allergies you have.  All medicines you are taking, including vitamins, herbs, eye drops, creams, and over-the-counter medicines.  Any problems you or family members have had with anesthetic medicines.  Any blood  disorders you have.  Any surgeries you have had.  Any medical conditions you have.  Whether you are pregnant or may be pregnant. What are the risks? Generally, this is a safe procedure. However, problems may occur, including:  Infection.  Bleeding.  Allergic reactions to medicines.  Damage to other structures or organs.  A stone remaining in the common bile duct. The common bile duct carries bile from the gallbladder into the small intestine.  A bile leak from the cyst duct that is clipped when your gallbladder is removed. Medicines  Ask your health care provider about: ? Changing or stopping your regular medicines. This is especially important if you are taking diabetes medicines or blood thinners. ? Taking medicines such as aspirin and ibuprofen. These medicines can thin your blood. Do not take these medicines before your procedure if your health care provider instructs you not to.  You may be given antibiotic medicine to help prevent infection. General instructions  Let your health care provider know if you develop a cold or an infection before surgery.  Plan to have someone take you home from the hospital or clinic.  Ask your health care provider how your surgical site will be marked or identified. What happens during the procedure?   To reduce your risk of infection: ? Your health care team will wash or sanitize their  hands. ? Your skin will be washed with soap. ? Hair may be removed from the surgical area.  An IV tube may be inserted into one of your veins.  You will be given one or more of the following: ? A medicine to help you relax (sedative). ? A medicine to make you fall asleep (general anesthetic).  A breathing tube will be placed in your mouth.  Your surgeon will make several small cuts (incisions) in your abdomen.  The laparoscope will be inserted through one of the small incisions. The camera on the laparoscope will send images to a TV screen  (monitor) in the operating room. This lets your surgeon see inside your abdomen.  Air-like gas will be pumped into your abdomen. This will expand your abdomen to give the surgeon more room to perform the surgery.  Other tools that are needed for the procedure will be inserted through the other incisions. The gallbladder will be removed through one of the incisions.  Your common bile duct may be examined. If stones are found in the common bile duct, they may be removed.  After your gallbladder has been removed, the incisions will be closed with stitches (sutures), staples, or skin glue.  Your incisions may be covered with a bandage (dressing). The procedure may vary among health care providers and hospitals. What happens after the procedure?  Your blood pressure, heart rate, breathing rate, and blood oxygen level will be monitored until the medicines you were given have worn off.  You will be given medicines as needed to control your pain.  Do not drive for 24 hours if you were given a sedative. This information is not intended to replace advice given to you by your health care provider. Make sure you discuss any questions you have with your health care provider. Document Released: 07/11/2005 Document Revised: 06/08/2017 Document Reviewed: 12/28/2015 Elsevier Interactive Patient Education  2019 [employer].

## 2018-12-08 NOTE — H&P (Signed)
[facility] History and Physical  Reason for Referral: Gallbladder  Referring Physician: Dr. Quillian Quince, MD      Chief Complaint    Pre-op Exam      Tara Bright is a 53 y.o. female.  HPI: Ms. Tara Bright is a 53 yo well known to me who I saw back February 2020 with onset of RUQ pain since November 2019 with associated nausea, weight loss of 25 lbs reported, and radiation of the pain to the right side/ back. She said the pain was worse with spicy foods and she also has issues with bloating and changing between diarrhea and constipation.  She has been seen at [facility] and had a workup done due to coming in with anxiety and chest pain. They worked her up from "head to toe" and she was found to have an AAA and that she need to follow up. She ultimately had a HIDA scan ordered by her PCP that demonstrated biliary dyskinsia and some chronic cholecystitis.   During my exam she was having multiple issues in February and there was some concerning findings with her reporting loss of peripheral vision in her right eye and I thought I possibly heard a bruit on her carotid artery.  She was ultimately referred to vascular surgery, Dr. Donnetta Hutching, and a carotid duplex was ordered as well as ABI. She had < 50% stenosis of the carotids and mildly depressed ABIs.  Dr. Donnetta Hutching planned to follow up AAA in 2 years with a [place].  She also was seen by Cardiology Dr. Bronson Ing due to the chest pain, and her symptoms were not felt to be cardiac in nature and no stress test was recommended. He does plan to do a ECHO but did not feel that this needed to be done prior to her surgery for her gallbladder as she was an acceptable level of risk.   Some returns today with continued symptoms.       Past Medical History:  Diagnosis Date  . Abdominal aortic aneurysm (AAA) ([place])   . Abdominal pain   . Abnormal weight loss   . Ataxia 2016   and visual changes, left AMA before workup completed  . CAD  (coronary atherosclerotic disease)   . Hypertension   . Kidney stone   . Loss of appetite   . Nicotine dependence   . Right upper quadrant pain   . UTI (urinary tract infection)          Past Surgical History:  Procedure Laterality Date  . CYSTOSCOPY W/ URETERAL STENT PLACEMENT Right 04/05/2017   Procedure: CYSTOSCOPY WITH RETROGRADE PYELOGRAM/URETERAL STENT PLACEMENT;  Surgeon: Lucas Mallow, MD;  Location: [facility];  Service: Urology;  Laterality: Right;  . CYSTOSCOPY/URETEROSCOPY/HOLMIUM LASER/STENT PLACEMENT Right 04/26/2017   Procedure: CYSTOSCOPY/URETEROSCOPY/HOLMIUM LASER/STENT PLACEMENT;  Surgeon: Lucas Mallow, MD;  Location: [facility];  Service: Urology;  Laterality: Right;  . LITHOTRIPSY           Family History  Problem Relation Age of Onset  . Stomach cancer Mother   . Diabetes Father   . Heart failure Father     Social History   Tobacco Use  . Smoking status: Former Smoker    Packs/day: 0.50    Types: Cigarettes    Last attempt to quit: 10/04/2018    Years since quitting: 0.1  . Smokeless tobacco: Never Used  Substance Use Topics  . Alcohol use: No  . Drug use: No    Medications: I  have reviewed the patient's current medications.      Allergies as of 12/06/2018      Reactions   Codeine Other (See Comments)   Gi upset   Other Other (See Comments)   Just about all pain medications cause Nausea and Vomiting. Must take Phenergan before.    Penicillins Hives, Nausea And Vomiting      Medication List       Accurate as of Dec 06, 2018  2:20 PM. If you have any questions, ask your nurse or doctor.        aspirin EC 81 MG tablet Take 1 tablet (81 mg total) by mouth daily.   atorvastatin 20 MG tablet Commonly known as:  LIPITOR Take 1 tablet (20 mg total) by mouth daily.   cyclobenzaprine 5 MG tablet Commonly known as:  FLEXERIL Take 5 mg by mouth 3 (three) times daily as needed for muscle spasms.    lisinopril 10 MG tablet Commonly known as:  ZESTRIL Take 10 mg by mouth daily.   ondansetron 4 MG tablet Commonly known as:  ZOFRAN Take 4 mg by mouth every 8 (eight) hours as needed for nausea or vomiting.   traMADol 100 MG 24 hr tablet Commonly known as:  ULTRAM-ER Take 100 mg by mouth daily as needed for pain.        ROS:  A comprehensive review of systems was negative except for: Gastrointestinal: positive for abdominal pain, constipation, diarrhea and nausea  Blood pressure (!) 167/90, pulse 86, temperature 99.6 F (37.6 C), temperature source Temporal, resp. rate 16, height 5\' 3"  (1.6 m), weight 120 lb (54.4 kg), SpO2 98 %. Physical Exam Vitals signs reviewed.  Constitutional:      Appearance: Normal appearance.  HENT:     Head: Normocephalic.     Nose: Nose normal.     Mouth/Throat:     Mouth: Mucous membranes are moist.  Eyes:     Extraocular Movements: Extraocular movements intact.     Pupils: Pupils are equal, round, and reactive to light.  Neck:     Musculoskeletal: Normal range of motion.  Cardiovascular:     Rate and Rhythm: Normal rate and regular rhythm.  Pulmonary:     Effort: Pulmonary effort is normal.     Breath sounds: Normal breath sounds.  Abdominal:     General: Abdomen is flat. There is no distension.     Palpations: Abdomen is soft.     Tenderness: There is abdominal tenderness in the right upper quadrant.  Musculoskeletal: Normal range of motion.        General: No swelling.  Skin:    General: Skin is warm and dry.  Neurological:     General: No focal deficit present.     Mental Status: She is alert and oriented to person, place, and time.  Psychiatric:        Mood and Affect: Mood normal.        Behavior: Behavior normal.        Thought Content: Thought content normal.        Judgment: Judgment normal.     Results: Carotid 08/2018 IMPRESSION: 1. Bilateral carotid bifurcation plaque resulting in less than 50%  diameter stenosis. 2. Antegrade bilateral vertebral arterial flow.  ABI 08/2018 IMPRESSION: Mildly depressed left ankle-brachial index at rest with nearly monophasic dorsalis pedis waveform. Resting right ankle-brachial index is nearly normal with essentially normal distal waveforms.  OSH HIDA EF 29%, ? Chronic cholecystitis with delayed filling with  tracer only after morphine   Assessment & Plan:  Tara Bright is a 53 y.o. female with chronic cholecystitis and continued symptoms. We have discussed that all of her symptoms may not be from the gallbladder and that if she continued to have issues she would need to see GI for possible EGD and possible treatment of IBS (diarrhea/ constipation alternating).   -PLAN: I counseled the patient about the indication, risks and benefits of laparoscopic cholecystectomy.  She understands there is a very small chance for bleeding, infection, injury to normal structures (including common bile duct), conversion to open surgery, persistent symptoms, evolution of postcholecystectomy diarrhea, need for secondary interventions, anesthesia reaction, cardiopulmonary issues and other risks not specifically detailed here. I described the expected recovery, the plan for follow-up and the restrictions during the recovery phase.  All questions were answered.  -We discussed the current COVID 19 pandemic and COVID 19 testing prior to any elective cases. We discussed isolation after her COVID 19 testing.   All questions were answered to the satisfaction of the patient.  After careful consideration, Tara Bright has decided to proceed.    Tara Bright 12/06/2018, 2:20 PM

## 2018-12-14 NOTE — Patient Instructions (Signed)
Tara Bright  12/14/2018     @PREFPERIOPPHARMACY @   Your procedure is scheduled on  12/18/18 .  Report to [facility] at  615   A.M.  Call this number if you have problems the morning of surgery:  [phone]   Remember:  Do not eat or drink after midnight.                          Take these medicines the morning of surgery with A SIP OF WATER  Flexaril(if needed), zofran(if needed), tramadol(if needed).    Do not wear jewelry, make-up or nail polish.  Do not wear lotions, powders, or perfumes, or deodorant.  Do not shave 48 hours prior to surgery.  Men may shave face and neck.  Do not bring valuables to the hospital.  [facility] is not responsible for any belongings or valuables.  Contacts, dentures or bridgework may not be worn into surgery.  Leave your suitcase in the car.  After surgery it may be brought to your room.  For patients admitted to the hospital, discharge time will be determined by your treatment team.  Patients discharged the day of surgery will not be allowed to drive home.   Name and phone number of your driver:   family Special instructions:  None  Please read over the following fact sheets that you were given. Anesthesia Post-op Instructions and Care and Recovery After Surgery       Laparoscopic Cholecystectomy, Care After This sheet gives you information about how to care for yourself after your procedure. Your health care provider may also give you more specific instructions. If you have problems or questions, contact your health care provider. What can I expect after the procedure? After the procedure, it is common to have:  Pain at your incision sites. You will be given medicines to control this pain.  Mild nausea or vomiting.  Bloating and possible shoulder pain from the air-like gas that was used during the procedure. Follow these instructions at home: Incision care   Follow instructions from your health care provider  about how to take care of your incisions. Make sure you: ? Wash your hands with soap and water before you change your bandage (dressing). If soap and water are not available, use hand sanitizer. ? Change your dressing as told by your health care provider. ? Leave stitches (sutures), skin glue, or adhesive strips in place. These skin closures may need to be in place for 2 weeks or longer. If adhesive strip edges start to loosen and curl up, you may trim the loose edges. Do not remove adhesive strips completely unless your health care provider tells you to do that.  Do not take baths, swim, or use a hot tub until your health care provider approves. Ask your health care provider if you can take showers. You may only be allowed to take sponge baths for bathing.  Check your incision area every day for signs of infection. Check for: ? More redness, swelling, or pain. ? More fluid or blood. ? Warmth. ? Pus or a bad smell. Activity  Do not drive or use heavy machinery while taking prescription pain medicine.  Do not lift anything that is heavier than 10 lb (4.5 kg) until your health care provider approves.  Do not play contact sports until your health care provider approves.  Do not drive for 24 hours if you  were given a medicine to help you relax (sedative).  Rest as needed. Do not return to work or school until your health care provider approves. General instructions  Take over-the-counter and prescription medicines only as told by your health care provider.  To prevent or treat constipation while you are taking prescription pain medicine, your health care provider may recommend that you: ? Drink enough fluid to keep your urine clear or pale yellow. ? Take over-the-counter or prescription medicines. ? Eat foods that are high in fiber, such as fresh fruits and vegetables, whole grains, and beans. ? Limit foods that are high in fat and processed sugars, such as fried and sweet foods. Contact  a health care provider if:  You develop a rash.  You have more redness, swelling, or pain around your incisions.  You have more fluid or blood coming from your incisions.  Your incisions feel warm to the touch.  You have pus or a bad smell coming from your incisions.  You have a fever.  One or more of your incisions breaks open. Get help right away if:  You have trouble breathing.  You have chest pain.  You have increasing pain in your shoulders.  You faint or feel dizzy when you stand.  You have severe pain in your abdomen.  You have nausea or vomiting that lasts for more than one day.  You have leg pain. This information is not intended to replace advice given to you by your health care provider. Make sure you discuss any questions you have with your health care provider. Document Released: 07/11/2005 Document Revised: 01/30/2016 Document Reviewed: 12/28/2015 Elsevier Interactive Patient Education  2019 [place] Anesthesia, Adult, Care After This sheet gives you information about how to care for yourself after your procedure. Your health care provider may also give you more specific instructions. If you have problems or questions, contact your health care provider. What can I expect after the procedure? After the procedure, the following side effects are common:  Pain or discomfort at the IV site.  Nausea.  Vomiting.  Sore throat.  Trouble concentrating.  Feeling cold or chills.  Weak or tired.  Sleepiness and fatigue.  Soreness and body aches. These side effects can affect parts of the body that were not involved in surgery. Follow these instructions at home:  For at least 24 hours after the procedure:  Have a responsible adult stay with you. It is important to have someone help care for you until you are awake and alert.  Rest as needed.  Do not: ? Participate in activities in which you could fall or become injured. ? Drive. ? Use  heavy machinery. ? Drink alcohol. ? Take sleeping pills or medicines that cause drowsiness. ? Make important decisions or sign legal documents. ? Take care of children on your own. Eating and drinking  Follow any instructions from your health care provider about eating or drinking restrictions.  When you feel hungry, start by eating small amounts of foods that are soft and easy to digest (bland), such as toast. Gradually return to your regular diet.  Drink enough fluid to keep your urine pale yellow.  If you vomit, rehydrate by drinking water, juice, or clear broth. General instructions  If you have sleep apnea, surgery and certain medicines can increase your risk for breathing problems. Follow instructions from your health care provider about wearing your sleep device: ? Anytime you are sleeping, including during daytime naps. ? While taking  prescription pain medicines, sleeping medicines, or medicines that make you drowsy.  Return to your normal activities as told by your health care provider. Ask your health care provider what activities are safe for you.  Take over-the-counter and prescription medicines only as told by your health care provider.  If you smoke, do not smoke without supervision.  Keep all follow-up visits as told by your health care provider. This is important. Contact a health care provider if:  You have nausea or vomiting that does not get better with medicine.  You cannot eat or drink without vomiting.  You have pain that does not get better with medicine.  You are unable to pass urine.  You develop a skin rash.  You have a fever.  You have redness around your IV site that gets worse. Get help right away if:  You have difficulty breathing.  You have chest pain.  You have blood in your urine or stool, or you vomit blood. Summary  After the procedure, it is common to have a sore throat or nausea. It is also common to feel tired.  Have a  responsible adult stay with you for the first 24 hours after general anesthesia. It is important to have someone help care for you until you are awake and alert.  When you feel hungry, start by eating small amounts of foods that are soft and easy to digest (bland), such as toast. Gradually return to your regular diet.  Drink enough fluid to keep your urine pale yellow.  Return to your normal activities as told by your health care provider. Ask your health care provider what activities are safe for you. This information is not intended to replace advice given to you by your health care provider. Make sure you discuss any questions you have with your health care provider. Document Released: 10/17/2000 Document Revised: 02/24/2017 Document Reviewed: 02/24/2017 Elsevier Interactive Patient Education  2019 [place]. How to Use Chlorhexidine Before Surgery Chlorhexidine gluconate (CHG) is a germ-killing (antiseptic) solution that is used to clean the skin. It gets rid of the bacteria that normally live on the skin. Cleaning your skin with CHG before surgery helps lower the risk for infection after surgery. To clean your skin before surgery, you may be given:  A CHG solution to use in the shower.  A prepackaged cloth that contains CHG. What are the risks? Risks of using CHG include:  A skin reaction.  Hearing loss, if CHG gets in your ears.  Eye injury, if CHG gets in your eyes and is not rinsed out.  The CHG product catching fire. Make sure that you avoid smoking and flames after applying CHG to your skin. Do not use CHG:  If you have a chlorhexidine allergy or have previously reacted to chlorhexidine.  On babies younger than 9 months of age. How to use CHG solution   Use CHG only as told by your health care provider, and follow the instructions on the label.  Use CHG solution while taking a shower. Follow these steps when using CHG solution (unless your health care provider gives  you different instructions): 1. Start the shower. 2. Use your normal soap and shampoo to wash your face and hair. 3. Turn off the shower or move out of the shower stream. 4. Pour the CHG onto a clean washcloth. Do not use any type of brush or rough-edged sponge. 5. Starting at your neck, lather your body down to your toes. Make sure you:  Pay special attention to the part of your body where you will be having surgery. Scrub this area for at least 1 minute.  Use the full amount of CHG as directed. Usually, this is one bottle.  Do not use CHG on your head or face. If the solution gets into your ears or eyes, rinse them well with water.  Avoid your genital area.  Avoid any areas of skin that have broken skin, cuts, or scrapes.  Scrub your back and under your arms. Make sure to wash skin folds. 6. Let the lather sit on your skin for 1-2 minutes or as long as told by your health care provider. 7. Thoroughly rinse your entire body in the shower. Make sure that all body creases and crevices are rinsed well. 8. Dry off with a clean towel. Do not put any substances on your body afterward, such as powder, lotion, or perfume. 9. Put on clean clothes or pajamas. 10. If it is the night before your surgery, sleep in clean sheets. How to use CHG prepackaged cloths   Only use CHG cloths as told by your health care provider, and follow the instructions on the label.  Use the CHG cloth on clean, dry skin. Follow these steps when using a CHG cloth (unless your health care provider gives you different instructions): 1. Using the CHG cloth, vigorously scrub the part of your body where you will be having surgery. Scrub using a back-and-forth motion for 3 minutes. The area on your body should be completely wet with CHG when you are done scrubbing. 2. Do not rinse. Discard the cloth and let the area air-dry for 1 minute. Do not put any substances on your body afterward, such as powder, lotion, or perfume. 3.  Put on clean clothes or pajamas. 4. If it is the night before your surgery, sleep in clean sheets. Contact a health care provider if:  Your skin gets irritated after scrubbing.  You have questions about using your solution or cloth. Get help right away if:  Your eyes become very red or swollen.  Your eyes itch badly.  Your skin itches badly and is red or swollen.  Your hearing changes.  You have trouble seeing.  You have swelling or tingling in your mouth or throat.  You have trouble breathing.  You swallow any chlorhexidine. Summary  Chlorhexidine gluconate (CHG) is a germ-killing (antiseptic) solution that is used to clean the skin. Cleaning your skin with CHG before surgery helps lower the risk for infection after surgery.  You may be given CHG to use at home. It may be in a bottle or in a prepackaged cloth to use on your skin. Carefully follow your health care provider's instructions and the instructions on the product label.  Do not use CHG if you have a chlorhexidine allergy.  Contact your health care provider if your skin gets irritated after scrubbing. This information is not intended to replace advice given to you by your health care provider. Make sure you discuss any questions you have with your health care provider. Document Released: 04/04/2012 Document Revised: 06/08/2017 Document Reviewed: 06/08/2017 Elsevier Interactive Patient Education  2019 [employer].

## 2018-12-18 ENCOUNTER — Other Ambulatory Visit: Payer: Self-pay

## 2018-12-18 ENCOUNTER — Encounter (HOSPITAL_COMMUNITY)
Admission: RE | Admit: 2018-12-18 | Discharge: 2018-12-18 | Disposition: A | Discharge status: Home / Self Care | Payer: Self-pay | Source: Ambulatory Visit | Attending: General Surgery | Admitting: General Surgery

## 2018-12-18 ENCOUNTER — Other Ambulatory Visit (HOSPITAL_COMMUNITY)
Admission: RE | Admit: 2018-12-18 | Discharge: 2018-12-18 | Disposition: A | Discharge status: Home / Self Care | Payer: HRSA Program | Source: Ambulatory Visit | Attending: General Surgery | Admitting: General Surgery

## 2018-12-18 DIAGNOSIS — Z1159 Encounter for screening for other viral diseases: Secondary | ICD-10-CM | POA: Insufficient documentation

## 2018-12-18 DIAGNOSIS — K811 Chronic cholecystitis: Secondary | ICD-10-CM | POA: Insufficient documentation

## 2018-12-18 DIAGNOSIS — Z01812 Encounter for preprocedural laboratory examination: Secondary | ICD-10-CM | POA: Insufficient documentation

## 2018-12-18 LAB — BASIC METABOLIC PANEL
Anion gap: 10 (ref 5–15)
BUN: 16 mg/dL (ref 6–20)
CO2: 24 mmol/L (ref 22–32)
Calcium: 9.9 mg/dL (ref 8.9–10.3)
Chloride: 104 mmol/L (ref 98–111)
Creatinine, Ser: 0.77 mg/dL (ref 0.44–1.00)
GFR calc Af Amer: >60 mL/min (ref >60)
GFR calc non Af Amer: >60 mL/min (ref >60)
Glucose, Bld: 97 mg/dL (ref 70–99)
Potassium: 3.7 mmol/L (ref 3.5–5.1)
Sodium: 138 mmol/L (ref 135–145)

## 2018-12-18 LAB — HCG, SERUM, QUALITATIVE: Preg, Serum: NEGATIVE

## 2018-12-19 LAB — NOVEL CORONAVIRUS, NAA (HOSP ORDER, SEND-OUT TO REF LAB; TAT 18-24 HRS): SARS-CoV-2, NAA: NOT DETECTED

## 2018-12-21 ENCOUNTER — Other Ambulatory Visit: Payer: Self-pay

## 2018-12-21 ENCOUNTER — Encounter (HOSPITAL_COMMUNITY)
Admission: RE | Disposition: A | Discharge status: Home / Self Care | Payer: Self-pay | Source: Home / Self Care | Attending: General Surgery

## 2018-12-21 ENCOUNTER — Ambulatory Visit (HOSPITAL_COMMUNITY)
Admission: RE | Admit: 2018-12-21 | Discharge: 2018-12-21 | Disposition: A | Discharge status: Home / Self Care | Payer: Self-pay | Attending: General Surgery | Admitting: General Surgery

## 2018-12-21 ENCOUNTER — Ambulatory Visit (HOSPITAL_COMMUNITY): Payer: Self-pay | Admitting: Anesthesiology

## 2018-12-21 ENCOUNTER — Encounter (HOSPITAL_COMMUNITY): Payer: Self-pay | Admitting: *Deleted

## 2018-12-21 DIAGNOSIS — Z79899 Other long term (current) drug therapy: Secondary | ICD-10-CM | POA: Insufficient documentation

## 2018-12-21 DIAGNOSIS — I10 Essential (primary) hypertension: Secondary | ICD-10-CM | POA: Insufficient documentation

## 2018-12-21 DIAGNOSIS — K811 Chronic cholecystitis: Secondary | ICD-10-CM | POA: Insufficient documentation

## 2018-12-21 DIAGNOSIS — Z87891 Personal history of nicotine dependence: Secondary | ICD-10-CM | POA: Insufficient documentation

## 2018-12-21 DIAGNOSIS — K828 Other specified diseases of gallbladder: Secondary | ICD-10-CM | POA: Insufficient documentation

## 2018-12-21 DIAGNOSIS — Z7982 Long term (current) use of aspirin: Secondary | ICD-10-CM | POA: Insufficient documentation

## 2018-12-21 DIAGNOSIS — I2510 Atherosclerotic heart disease of native coronary artery without angina pectoris: Secondary | ICD-10-CM | POA: Insufficient documentation

## 2018-12-21 DIAGNOSIS — I714 Abdominal aortic aneurysm, without rupture: Secondary | ICD-10-CM | POA: Insufficient documentation

## 2018-12-21 DIAGNOSIS — I739 Peripheral vascular disease, unspecified: Secondary | ICD-10-CM | POA: Insufficient documentation

## 2018-12-21 HISTORY — PX: CHOLECYSTECTOMY: SHX55

## 2018-12-21 SURGERY — LAPAROSCOPIC CHOLECYSTECTOMY
Anesthesia: General | Site: Abdomen

## 2018-12-21 MED ORDER — ONDANSETRON HCL 4 MG PO TABS: 4.0000 mg | ORAL_TABLET | Freq: Three times a day (TID) | ORAL | 0 refills | Status: DC | PRN

## 2018-12-21 MED ORDER — CIPROFLOXACIN IN D5W 400 MG/200ML IV SOLN
400.0000 mg | INTRAVENOUS | Status: AC
  Administered 2018-12-21: 400 mg via INTRAVENOUS
  Filled 2018-12-21: qty 200

## 2018-12-21 MED ORDER — GLYCOPYRROLATE PF 0.2 MG/ML IJ SOSY
PREFILLED_SYRINGE | INTRAMUSCULAR | Status: AC
  Filled 2018-12-21: qty 1

## 2018-12-21 MED ORDER — PROPOFOL 10 MG/ML IV BOLUS
INTRAVENOUS | Status: AC
  Filled 2018-12-21: qty 40

## 2018-12-21 MED ORDER — SUCCINYLCHOLINE CHLORIDE 200 MG/10ML IV SOSY
PREFILLED_SYRINGE | INTRAVENOUS | Status: AC
  Filled 2018-12-21: qty 10

## 2018-12-21 MED ORDER — ONDANSETRON HCL 4 MG/2ML IJ SOLN
INTRAMUSCULAR | Status: AC
  Filled 2018-12-21: qty 2

## 2018-12-21 MED ORDER — HYDROMORPHONE HCL 1 MG/ML IJ SOLN
0.5000 mg | INTRAMUSCULAR | Status: DC | PRN
  Administered 2018-12-21: 0.5 mg via INTRAVENOUS
  Filled 2018-12-21: qty 0.5

## 2018-12-21 MED ORDER — MIDAZOLAM HCL 2 MG/2ML IJ SOLN: 0.5000 mg | Freq: Once | INTRAMUSCULAR | Status: DC | PRN

## 2018-12-21 MED ORDER — 0.9 % SODIUM CHLORIDE (POUR BTL) OPTIME
TOPICAL | Status: DC | PRN
  Administered 2018-12-21: 1000 mL

## 2018-12-21 MED ORDER — SODIUM CHLORIDE 0.9% FLUSH
INTRAVENOUS | Status: AC
  Filled 2018-12-21: qty 30

## 2018-12-21 MED ORDER — ONDANSETRON HCL 4 MG/2ML IJ SOLN
INTRAMUSCULAR | Status: DC | PRN
  Administered 2018-12-21: 4 mg via INTRAVENOUS

## 2018-12-21 MED ORDER — FENTANYL CITRATE (PF) 100 MCG/2ML IJ SOLN
INTRAMUSCULAR | Status: DC | PRN
  Administered 2018-12-21 (×3): 50 ug via INTRAVENOUS

## 2018-12-21 MED ORDER — PROPOFOL 10 MG/ML IV BOLUS
INTRAVENOUS | Status: DC | PRN
  Administered 2018-12-21: 160 mg via INTRAVENOUS

## 2018-12-21 MED ORDER — SUGAMMADEX SODIUM 200 MG/2ML IV SOLN
INTRAVENOUS | Status: DC | PRN
  Administered 2018-12-21: 108.8 mg via INTRAVENOUS

## 2018-12-21 MED ORDER — DEXAMETHASONE SODIUM PHOSPHATE 10 MG/ML IJ SOLN
INTRAMUSCULAR | Status: AC
  Filled 2018-12-21: qty 1

## 2018-12-21 MED ORDER — GLYCOPYRROLATE PF 0.2 MG/ML IJ SOSY
PREFILLED_SYRINGE | INTRAMUSCULAR | Status: DC | PRN
  Administered 2018-12-21: .2 mg via INTRAVENOUS

## 2018-12-21 MED ORDER — PHENYLEPHRINE HCL (PRESSORS) 10 MG/ML IV SOLN
INTRAVENOUS | Status: DC | PRN
  Administered 2018-12-21: 80 ug via INTRAVENOUS

## 2018-12-21 MED ORDER — DOCUSATE SODIUM 100 MG PO CAPS: 100.0000 mg | ORAL_CAPSULE | Freq: Two times a day (BID) | ORAL | 2 refills | Status: DC

## 2018-12-21 MED ORDER — LIDOCAINE 2% (20 MG/ML) 5 ML SYRINGE
INTRAMUSCULAR | Status: DC | PRN
  Administered 2018-12-21: 40 mg via INTRAVENOUS

## 2018-12-21 MED ORDER — SUCCINYLCHOLINE CHLORIDE 20 MG/ML IJ SOLN
INTRAMUSCULAR | Status: DC | PRN
  Administered 2018-12-21: 120 mg via INTRAVENOUS

## 2018-12-21 MED ORDER — FENTANYL CITRATE (PF) 250 MCG/5ML IJ SOLN
INTRAMUSCULAR | Status: AC
  Filled 2018-12-21: qty 5

## 2018-12-21 MED ORDER — LACTATED RINGERS IV SOLN
INTRAVENOUS | Status: DC
  Administered 2018-12-21: 1000 mL via INTRAVENOUS

## 2018-12-21 MED ORDER — BUPIVACAINE HCL (PF) 0.5 % IJ SOLN
INTRAMUSCULAR | Status: DC | PRN
  Administered 2018-12-21: 10 mL

## 2018-12-21 MED ORDER — CHLORHEXIDINE GLUCONATE CLOTH 2 % EX PADS: 6.0000 | MEDICATED_PAD | Freq: Once | CUTANEOUS | Status: DC

## 2018-12-21 MED ORDER — PROMETHAZINE HCL 25 MG/ML IJ SOLN
6.2500 mg | INTRAMUSCULAR | Status: DC | PRN
  Administered 2018-12-21: 6.25 mg via INTRAVENOUS
  Filled 2018-12-21: qty 1

## 2018-12-21 MED ORDER — BUPIVACAINE HCL (PF) 0.5 % IJ SOLN
INTRAMUSCULAR | Status: AC
  Filled 2018-12-21: qty 30

## 2018-12-21 MED ORDER — ROCURONIUM BROMIDE 10 MG/ML (PF) SYRINGE
PREFILLED_SYRINGE | INTRAVENOUS | Status: AC
  Filled 2018-12-21: qty 10

## 2018-12-21 MED ORDER — DEXAMETHASONE SODIUM PHOSPHATE 4 MG/ML IJ SOLN
INTRAMUSCULAR | Status: DC | PRN
  Administered 2018-12-21: 8 mg via INTRAVENOUS

## 2018-12-21 MED ORDER — LIDOCAINE 2% (20 MG/ML) 5 ML SYRINGE
INTRAMUSCULAR | Status: AC
  Filled 2018-12-21: qty 5

## 2018-12-21 MED ORDER — OXYCODONE HCL 5 MG PO TABS: 5.0000 mg | ORAL_TABLET | ORAL | 0 refills | Status: DC | PRN

## 2018-12-21 MED ORDER — HEMOSTATIC AGENTS (NO CHARGE) OPTIME
TOPICAL | Status: DC | PRN
  Administered 2018-12-21: 1 via TOPICAL

## 2018-12-21 MED ORDER — ROCURONIUM BROMIDE 50 MG/5ML IV SOSY
PREFILLED_SYRINGE | INTRAVENOUS | Status: DC | PRN
  Administered 2018-12-21: 20 mg via INTRAVENOUS

## 2018-12-21 MED ORDER — LABETALOL HCL 5 MG/ML IV SOLN
INTRAVENOUS | Status: DC | PRN
  Administered 2018-12-21: 5 mg via INTRAVENOUS

## 2018-12-21 SURGICAL SUPPLY — 52 items
ADH SKN CLS APL DERMABOND .7 (GAUZE/BANDAGES/DRESSINGS) ×1
APL PRP STRL LF DISP 70% ISPRP (MISCELLANEOUS) ×1
APPLIER CLIP ROT 10 11.4 M/L (STAPLE) ×3
APR CLP MED LRG 11.4X10 (STAPLE) ×1
BAG RETRIEVAL 10 (BASKET) ×1
BAG RETRIEVAL 10MM (BASKET) ×1
BLADE SURG 15 STRL LF DISP TIS (BLADE) ×1 IMPLANT
BLADE SURG 15 STRL SS (BLADE) ×3
CHLORAPREP W/TINT 26 (MISCELLANEOUS) ×3 IMPLANT
CLIP APPLIE ROT 10 11.4 M/L (STAPLE) ×1 IMPLANT
CLOTH BEACON ORANGE TIMEOUT ST (SAFETY) ×3 IMPLANT
COVER LIGHT HANDLE STERIS (MISCELLANEOUS) ×6 IMPLANT
COVER WAND RF STERILE (DRAPES) ×3 IMPLANT
DECANTER SPIKE VIAL GLASS SM (MISCELLANEOUS) ×3 IMPLANT
DERMABOND ADVANCED (GAUZE/BANDAGES/DRESSINGS) ×2
DERMABOND ADVANCED .7 DNX12 (GAUZE/BANDAGES/DRESSINGS) ×1 IMPLANT
ELECT REM PT RETURN 9FT ADLT (ELECTROSURGICAL) ×3
ELECTRODE REM PT RTRN 9FT ADLT (ELECTROSURGICAL) ×1 IMPLANT
FILTER SMOKE EVAC LAPAROSHD (FILTER) ×3 IMPLANT
GLOVE BIO SURGEON STRL SZ 6.5 (GLOVE) ×2 IMPLANT
GLOVE BIO SURGEON STRL SZ7 (GLOVE) ×2 IMPLANT
GLOVE BIO SURGEONS STRL SZ 6.5 (GLOVE) ×1
GLOVE BIOGEL PI IND STRL 6.5 (GLOVE) ×1 IMPLANT
GLOVE BIOGEL PI IND STRL 7.0 (GLOVE) ×3 IMPLANT
GLOVE BIOGEL PI INDICATOR 6.5 (GLOVE) ×2
GLOVE BIOGEL PI INDICATOR 7.0 (GLOVE) ×6
GLOVE SURG SS PI 7.5 STRL IVOR (GLOVE) ×2 IMPLANT
GOWN STRL REUS W/TWL LRG LVL3 (GOWN DISPOSABLE) ×9 IMPLANT
HEMOSTAT SNOW SURGICEL 2X4 (HEMOSTASIS) ×3 IMPLANT
INST SET LAPROSCOPIC AP (KITS) ×3 IMPLANT
IV NS IRRIG 3000ML ARTHROMATIC (IV SOLUTION) IMPLANT
KIT TURNOVER KIT A (KITS) ×3 IMPLANT
MANIFOLD NEPTUNE II (INSTRUMENTS) ×3 IMPLANT
NDL INSUFFLATION 14GA 120MM (NEEDLE) ×1 IMPLANT
NEEDLE INSUFFLATION 14GA 120MM (NEEDLE) ×3 IMPLANT
NS IRRIG 1000ML POUR BTL (IV SOLUTION) ×3 IMPLANT
PACK LAP CHOLE LZT030E (CUSTOM PROCEDURE TRAY) ×3 IMPLANT
PAD ARMBOARD 7.5X6 YLW CONV (MISCELLANEOUS) ×3 IMPLANT
SET BASIN LINEN APH (SET/KITS/TRAYS/PACK) ×3 IMPLANT
SET TUBE IRRIG SUCTION NO TIP (IRRIGATION / IRRIGATOR) IMPLANT
SLEEVE ENDOPATH XCEL 5M (ENDOMECHANICALS) ×3 IMPLANT
SUT MNCRL AB 4-0 PS2 18 (SUTURE) ×5 IMPLANT
SUT VICRYL 0 UR6 27IN ABS (SUTURE) ×3 IMPLANT
SYS BAG RETRIEVAL 10MM (BASKET) ×1
SYSTEM BAG RETRIEVAL 10MM (BASKET) ×1 IMPLANT
TROCAR ENDO BLADELESS 11MM (ENDOMECHANICALS) ×3 IMPLANT
TROCAR XCEL NON-BLD 5MMX100MML (ENDOMECHANICALS) ×3 IMPLANT
TROCAR XCEL UNIV SLVE 11M 100M (ENDOMECHANICALS) ×3 IMPLANT
TUBE CONNECTING 12'X1/4 (SUCTIONS) ×1
TUBE CONNECTING 12X1/4 (SUCTIONS) ×2 IMPLANT
TUBING INSUFFLATION (TUBING) ×3 IMPLANT
WARMER LAPAROSCOPE (MISCELLANEOUS) ×3 IMPLANT

## 2018-12-21 NOTE — Op Note (Signed)
Operative Note   Preoperative Diagnosis: Chronic Cholecystitis    Postoperative Diagnosis: Same   Procedure(s) Performed: Laparoscopic cholecystectomy   Surgeon: Ria Comment C. Constance Haw, MD   Assistants: Aviva Signs, MD    Anesthesia: General endotracheal   Anesthesiologist: Lenice Llamas, MD    Specimens: Gallbladder    Estimated Blood Loss: Minimal    Blood Replacement: None    Complications: None    Operative Findings: Distended gallbladder    Procedure: The patient was taken to the operating room and placed supine. General endotracheal anesthesia was induced. Intravenous antibiotics were administered per protocol. An orogastric tube positioned to decompress the stomach. The abdomen was prepared and draped in the usual sterile fashion.    A supraumbilical incision was made and a Veress technique was utilized to achieve pneumoperitoneum to 15 mmHg with carbon dioxide. A 11 mm optiview port was placed through the supraumbilical region, and a 10 mm 0-degree operative laparoscope was introduced. The area underlying the trocar and Veress needle were inspected and without evidence of injury.  Remaining trocars were placed under direct vision. Two 5 mm ports were placed in the right abdomen, between the anterior axillary and midclavicular line.  A final 11 mm port was placed through the mid-epigastrium, near the falciform ligament.    The gallbladder fundus was elevated cephalad and the infundibulum was retracted to the patient's right. The gallbladder/cystic duct junction was skeletonized. The cystic artery noted in the triangle of Calot and was also skeletonized.  We then continued liberal medial and lateral dissection until the critical view of safety was achieved.    The cystic duct and cystic artery were lying parallel and it was difficult to separate them without tearing them. They were easily clipped together and were triply clipped and divided. The gallbladder was then dissected  from the liver bed with electrocautery.  Final inspection revealed acceptable hemostasis. Surgical Emogene Morgan was placed in the gallbladder bed.The specimen was placed in an Endopouch and pneumoperitoneum was released through the [employer] due to COVID 19.  The gallbladder was then retrieved through the epigastric site.   Trocars were removed 0 Vicryl fascial sutures were used to close the umbilical port site but the epigastric site was at the level of the ribs so it was not closed. Skin incisions were closed with 4-0 Monocryl subcuticular sutures and Dermabond. The patient was awakened from anesthesia and extubated without complication.    Curlene Labrum, MD [facility] [address] [place], [place] [address] [phone] (office)

## 2018-12-21 NOTE — Anesthesia Postprocedure Evaluation (Signed)
Anesthesia Post Note  Patient: Print production planner  Procedure(s) Performed: LAPAROSCOPIC CHOLECYSTECTOMY (N/A Abdomen)  Patient location during evaluation: PACU Anesthesia Type: General Level of consciousness: awake and alert and oriented Pain management: pain level controlled Vital Signs Assessment: post-procedure vital signs reviewed and stable Respiratory status: spontaneous breathing Cardiovascular status: stable Postop Assessment: no apparent nausea or vomiting Anesthetic complications: no     Last Vitals:  Vitals:   12/21/18 0830 12/21/18 0845  BP: (!) 155/88 (!) 145/100  Pulse: 90 86  Resp: (!) 25 (!) 23  Temp:    SpO2: 100% 98%    Last Pain:  Vitals:   12/21/18 0647  TempSrc: Oral  PainSc: 7                  ADAMS, AMY A

## 2018-12-21 NOTE — Anesthesia Preprocedure Evaluation (Signed)
Anesthesia Evaluation  Patient identified by MRN, date of birth, ID band Patient awake    Reviewed: Allergy & Precautions, NPO status , Patient's Chart, lab work & pertinent test results  Airway Mallampati: II  TM Distance: >3 FB Neck ROM: Full    Dental no notable dental hx. (+) Poor Dentition, Missing   Pulmonary neg pulmonary ROS, former smoker,    Pulmonary exam normal breath sounds clear to auscultation       Cardiovascular Exercise Tolerance: Good hypertension, Pt. on medications and Pt. on home beta blockers + CAD and + Peripheral Vascular Disease  Normal cardiovascular examI Rhythm:Regular Rate:Normal     Neuro/Psych Reports visual field defect-was scanned -no critical stenosis  negative neurological ROS  negative psych ROS   GI/Hepatic negative GI ROS, Neg liver ROS, Reports fatty liver -denies EToh issues    Endo/Other  negative endocrine ROS  Renal/GU Renal InsufficiencyRenal disease  negative genitourinary   Musculoskeletal negative musculoskeletal ROS (+)   Abdominal   Peds negative pediatric ROS (+)  Hematology negative hematology ROS (+)   Anesthesia Other Findings   Reproductive/Obstetrics negative OB ROS                             Anesthesia Physical Anesthesia Plan  ASA: III  Anesthesia Plan: General   Post-op Pain Management:    Induction: Intravenous  PONV Risk Score and Plan:   Airway Management Planned: Oral ETT  Additional Equipment:   Intra-op Plan:   Post-operative Plan: Extubation in OR  Informed Consent: I have reviewed the patients History and Physical, chart, labs and discussed the procedure including the risks, benefits and alternatives for the proposed anesthesia with the patient or authorized representative who has indicated his/her understanding and acceptance.     Dental advisory given  Plan Discussed with: CRNA  Anesthesia Plan  Comments: (Plan Full PPE use  Plan GETA)        Anesthesia Quick Evaluation

## 2018-12-21 NOTE — Progress Notes (Signed)
[facility]  Called and spoke with husband, Gwynneth Macleod, notified that surgery complete and patient doing well.  Curlene Labrum, MD [facility] [address] [place], [place] [address] [phone] (office)

## 2018-12-21 NOTE — Anesthesia Procedure Notes (Signed)
Procedure Name: Intubation Date/Time: 12/21/2018 7:31 AM Performed by: Andree Elk, Amy A, CRNA Pre-anesthesia Checklist: Patient identified, Patient being monitored, Timeout performed, Emergency Drugs available and Suction available Patient Re-evaluated:Patient Re-evaluated prior to induction Oxygen Delivery Method: Circle system utilized Preoxygenation: Pre-oxygenation with 100% oxygen Induction Type: IV induction and Rapid sequence Laryngoscope Size: Mac and 3 Grade View: Grade I Tube type: Oral Tube size: 7.0 mm Number of attempts: 1 Airway Equipment and Method: Stylet Placement Confirmation: ETT inserted through vocal cords under direct vision,  positive ETCO2 and breath sounds checked- equal and bilateral Secured at: 21 cm Tube secured with: Tape Dental Injury: Teeth and Oropharynx as per pre-operative assessment

## 2018-12-21 NOTE — Discharge Instructions (Signed)
Discharge Laparoscopic Surgery Instructions:  Common Complaints: Right shoulder pain is common after laparoscopic surgery. This is secondary to the gas used in the surgery being trapped under the diaphragm.  Walk to help your body absorb the gas. This will improve in a few days. Pain at the port sites are common, especially the larger port sites. This will improve with time.  Some nausea is common and poor appetite. The main goal is to stay hydrated the first few days after surgery.   Diet/ Activity: Diet as tolerated. You may not have an appetite, but it is important to stay hydrated. Drink 64 ounces of water a day. Your appetite will return with time.  Shower per your regular routine daily.  Do not take hot showers. Take warm showers that are less than 10 minutes. Rest and listen to your body, but do not remain in bed all day.  Walk everyday for at least 15-20 minutes. Deep cough and move around every 1-2 hours in the first few days after surgery.  Do not lift > 10 lbs, perform excessive bending, pushing, pulling, squatting for 1-2 weeks after surgery.  Do not pick at the dermabond glue on your incision sites.  This glue film will remain in place for 1-2 weeks and will start to peel off.  Do not place lotions or balms on your incision unless instructed to specifically by Dr. Constance Haw.   Medication: Take tylenol and ibuprofen as needed for pain control, alternating every 4-6 hours.  Example:  Tylenol 108m @ 6am, 12noon, 6pm, 136mnight (Do not exceed 400012mf tylenol a day). Ibuprofen 800m22m9am, 3pm, 9pm, 3am (Do not exceed 3600mg48mibuprofen a day).  Take Roxicodone for breakthrough pain every 4 hours.  Take Colace for constipation related to narcotic pain medication. If you do not have a bowel movement in 2 days, take Miralax over the counter.  Drink plenty of water to also prevent constipation.   Contact Information: If you have questions or concerns, please call our office,  [phone]- Thursday 8AM-5PM and Friday 8AM-12Noon.  If it is after hours or on the weekend, please call [facility], [phone] ask to speak to the surgeon on call for Dr. BridgConstance HawnnieClear [facility], Care After This sheet gives you information about how to care for yourself after your procedure. Your doctor may also give you more specific instructions. If you have problems or questions, contact your doctor. Follow these instructions at home: Care for cuts from surgery (incisions)   Follow instructions from your doctor about how to take care of your cuts from surgery. Make sure you: ? Wash your hands with soap and water before you change your bandage (dressing). If you cannot use soap and water, use hand sanitizer. ? Change your bandage as told by your doctor. ? Leave stitches (sutures), skin glue, or skin tape (adhesive) strips in place. They may need to stay in place for 2 weeks or longer. If tape strips get loose and curl up, you may trim the loose edges. Do not remove tape strips completely unless your doctor says it is okay.  Do not take baths, swim, or use a hot tub until your doctor says it is okay.   You may shower.  Check your surgical cut area every day for signs of infection. Check for: ? More redness, swelling, or pain. ? More fluid or blood. ? Warmth. ? Pus or a bad smell. Activity  Do  not drive or use heavy machinery while taking prescription pain medicine.  Do not lift anything that is heavier than 10 lb (4.5 kg) until your doctor says it is okay.  Do not play contact sports until your doctor says it is okay.  Do not drive for 24 hours if you were given a medicine to help you relax (sedative).  Rest as needed. Do not return to work or school until your doctor says it is okay. General instructions  Take over-the-counter and prescription medicines only as told by your doctor.  To prevent or treat constipation  while you are taking prescription pain medicine, your doctor may recommend that you: ? Drink enough fluid to keep your pee (urine) clear or pale yellow. ? Take over-the-counter or prescription medicines. ? Eat foods that are high in fiber, such as fresh fruits and vegetables, whole grains, and beans. ? Limit foods that are high in fat and processed sugars, such as fried and sweet foods. Contact a doctor if:  You develop a rash.  You have more redness, swelling, or pain around your surgical cuts.  You have more fluid or blood coming from your surgical cuts.  Your surgical cuts feel warm to the touch.  You have pus or a bad smell coming from your surgical cuts.  You have a fever.  One or more of your surgical cuts breaks open. Get help right away if:  You have trouble breathing.  You have chest pain.  You have pain that is getting worse in your shoulders.  You faint or feel dizzy when you stand.  You have very bad pain in your belly (abdomen).  You are sick to your stomach (nauseous) for more than one day.  You have throwing up (vomiting) that lasts for more than one day.  You have leg pain. This information is not intended to replace advice given to you by your health care provider. Make sure you discuss any questions you have with your health care provider. Document Released: 04/19/2008 Document Revised: 01/30/2016 Document Reviewed: 12/28/2015 Elsevier Interactive Patient Education  2019 [employer] Anesthesia, Adult, Care After This sheet gives you information about how to care for yourself after your procedure. Your health care provider may also give you more specific instructions. If you have problems or questions, contact your health care provider. What can I expect after the procedure? After the procedure, the following side effects are common:  Pain or discomfort at the IV site.  Nausea.  Vomiting.  Sore throat.  Trouble  concentrating.  Feeling cold or chills.  Weak or tired.  Sleepiness and fatigue.  Soreness and body aches. These side effects can affect parts of the body that were not involved in surgery. Follow these instructions at home:  For at least 24 hours after the procedure:  Have a responsible adult stay with you. It is important to have someone help care for you until you are awake and alert.  Rest as needed.  Do not: ? Participate in activities in which you could fall or become injured. ? Drive. ? Use heavy machinery. ? Drink alcohol. ? Take sleeping pills or medicines that cause drowsiness. ? Make important decisions or sign legal documents. ? Take care of children on your own. Eating and drinking  Follow any instructions from your health care provider about eating or drinking restrictions.  When you feel hungry, start by eating small amounts of foods that are soft and easy to digest (bland), such as  toast. Gradually return to your regular diet.  Drink enough fluid to keep your urine pale yellow.  If you vomit, rehydrate by drinking water, juice, or clear broth. General instructions  If you have sleep apnea, surgery and certain medicines can increase your risk for breathing problems. Follow instructions from your health care provider about wearing your sleep device: ? Anytime you are sleeping, including during daytime naps. ? While taking prescription pain medicines, sleeping medicines, or medicines that make you drowsy.  Return to your normal activities as told by your health care provider. Ask your health care provider what activities are safe for you.  Take over-the-counter and prescription medicines only as told by your health care provider.  If you smoke, do not smoke without supervision.  Keep all follow-up visits as told by your health care provider. This is important. Contact a health care provider if:  You have nausea or vomiting that does not get better with  medicine.  You cannot eat or drink without vomiting.  You have pain that does not get better with medicine.  You are unable to pass urine.  You develop a skin rash.  You have a fever.  You have redness around your IV site that gets worse. Get help right away if:  You have difficulty breathing.  You have chest pain.  You have blood in your urine or stool, or you vomit blood. Summary  After the procedure, it is common to have a sore throat or nausea. It is also common to feel tired.  Have a responsible adult stay with you for the first 24 hours after general anesthesia. It is important to have someone help care for you until you are awake and alert.  When you feel hungry, start by eating small amounts of foods that are soft and easy to digest (bland), such as toast. Gradually return to your regular diet.  Drink enough fluid to keep your urine pale yellow.  Return to your normal activities as told by your health care provider. Ask your health care provider what activities are safe for you. This information is not intended to replace advice given to you by your health care provider. Make sure you discuss any questions you have with your health care provider. Document Released: 10/17/2000 Document Revised: 02/24/2017 Document Reviewed: 02/24/2017 Elsevier Interactive Patient Education  2019 [employer].

## 2018-12-21 NOTE — Transfer of Care (Signed)
Immediate Anesthesia Transfer of Care Note  Patient: Tara Bright  Procedure(s) Performed: LAPAROSCOPIC CHOLECYSTECTOMY (N/A Abdomen)  Patient Location: PACU  Anesthesia Type:General  Level of Consciousness: awake, oriented and patient cooperative  Airway & Oxygen Therapy: Patient Spontanous Breathing and Patient connected to face mask oxygen  Post-op Assessment: Report given to RN and Post -op Vital signs reviewed and stable  Post vital signs: Reviewed and stable  Last Vitals:  Vitals Value Taken Time  BP 155/88 12/21/2018  8:30 AM  Temp    Pulse 91 12/21/2018  8:31 AM  Resp 17 12/21/2018  8:31 AM  SpO2 100 % 12/21/2018  8:31 AM  Vitals shown include unvalidated device data.  Last Pain:  Vitals:   12/21/18 0647  TempSrc: Oral  PainSc: 7       Patients Stated Pain Goal: 7 (14/43/15 4008)  Complications: No apparent anesthesia complications

## 2018-12-21 NOTE — Interval H&P Note (Signed)
History and Physical Interval Note:  12/21/2018 7:17 AM  [facility]  has presented today for surgery, with the diagnosis of chronic cholelithiasis.  The various methods of treatment have been discussed with the patient and family. After consideration of risks, benefits and other options for treatment, the patient has consented to  Procedure(s): LAPAROSCOPIC CHOLECYSTECTOMY (N/A) as a surgical intervention.  The patient's history has been reviewed, patient examined, no change in status, stable for surgery.  I have reviewed the patient's chart and labs.  Questions were answered to the patient's satisfaction.    No changes or concerns.  Virl Cagey

## 2018-12-24 ENCOUNTER — Encounter (HOSPITAL_COMMUNITY): Payer: Self-pay | Admitting: General Surgery

## 2018-12-27 ENCOUNTER — Other Ambulatory Visit: Payer: Self-pay | Admitting: General Surgery

## 2019-01-03 ENCOUNTER — Other Ambulatory Visit: Payer: Self-pay

## 2019-01-03 ENCOUNTER — Encounter: Payer: Self-pay | Admitting: General Surgery

## 2019-01-03 ENCOUNTER — Ambulatory Visit (INDEPENDENT_AMBULATORY_CARE_PROVIDER_SITE_OTHER): Payer: Self-pay | Admitting: General Surgery

## 2019-01-03 VITALS — BP 144/87 | HR 69 | Temp 99.1°F | Resp 16 | Ht 63.0 in | Wt 115.0 lb

## 2019-01-03 DIAGNOSIS — K811 Chronic cholecystitis: Secondary | ICD-10-CM

## 2019-01-03 NOTE — Progress Notes (Signed)
[facility] Note   HPI:  53 y.o. Female presents to clinic for post-op follow-up evaluation of after a laparoscopic cholecystectomy. I was going to do a telephone visit but the patient was confused and came in person. She has some soreness and some diarrhea but no major complaints.   Review of Systems:  No fever or chills Port sites healing Diarrhea and constipation All other review of systems: otherwise negative   Vital Signs:  BP (!) 144/87 (BP Location: Left Arm, Patient Position: Sitting, Cuff Size: Normal)   Pulse 69   Temp 99.1 F (37.3 C) (Temporal)   Resp 16   Ht 5\' 3"  (1.6 m)   Wt 115 lb (52.2 kg)   LMP 03/08/2017   SpO2 97%   BMI 20.37 kg/m    Physical Exam:  Physical Exam HENT:     Head: Normocephalic.  Cardiovascular:     Rate and Rhythm: Normal rate.  Pulmonary:     Effort: Pulmonary effort is normal.  Abdominal:     General: There is no distension.     Palpations: Abdomen is soft.     Tenderness: There is no abdominal tenderness.     Comments: Healing port sites, peeling dermabond, no erythema or drainage  Neurological:     Mental Status: She is alert.      Assessment:  53 y.o. yo Female s/p lap cholecystectomy for chronic cholecystitis with adenomyosis on pathology. Doing well.   Plan:  - Diet and activity as tolerated   - Wait 4 weeks before getting in pool or water  - PRN Follow up   All of the above recommendations were discussed with the patient, and all of patient's questions were answered to her expressed satisfaction.  Curlene Labrum, MD [facility] [address] [place], [place] [address] [phone] (office)

## 2019-01-03 NOTE — Patient Instructions (Signed)
Diet and activity as tolerated.

## 2019-01-10 ENCOUNTER — Encounter: Payer: Self-pay | Admitting: *Deleted

## 2019-01-10 ENCOUNTER — Other Ambulatory Visit: Payer: Self-pay | Admitting: Cardiovascular Disease

## 2019-01-10 DIAGNOSIS — R9431 Abnormal electrocardiogram [ECG] [EKG]: Secondary | ICD-10-CM

## 2019-01-10 DIAGNOSIS — R079 Chest pain, unspecified: Secondary | ICD-10-CM

## 2019-01-17 ENCOUNTER — Other Ambulatory Visit: Payer: Self-pay

## 2019-01-17 ENCOUNTER — Ambulatory Visit (INDEPENDENT_AMBULATORY_CARE_PROVIDER_SITE_OTHER): Payer: Self-pay

## 2019-01-17 DIAGNOSIS — R079 Chest pain, unspecified: Secondary | ICD-10-CM

## 2019-01-17 DIAGNOSIS — R9431 Abnormal electrocardiogram [ECG] [EKG]: Secondary | ICD-10-CM

## 2019-01-22 ENCOUNTER — Telehealth: Payer: Self-pay | Admitting: *Deleted

## 2019-01-22 NOTE — Telephone Encounter (Signed)
Patient informed. Copy sent to PCP °

## 2019-01-22 NOTE — Telephone Encounter (Signed)
-----   Message from Herminio Commons, MD sent at 01/19/2019  9:03 AM EDT ----- Mild calcium buildup on aortic and mitral valves but not leading to any valve dysfunction. Cardiac function is normal.

## 2019-01-24 ENCOUNTER — Telehealth: Payer: Self-pay | Admitting: Cardiovascular Disease

## 2019-03-02 IMAGING — XA DG ABDOMEN 1V
3 series · 3 of 3 positions shown · non-contrast
Comparison: CT 04/05/2017

CLINICAL DATA: 50-year-old female with a history of right-sided
ureteral stent placement for nephrolithiasis

EXAM:
RETROGRADE PYELOGRAM

[Series 1: uro standard · 1 of 1 slices shown (1 of 3)]
[im 1/1]
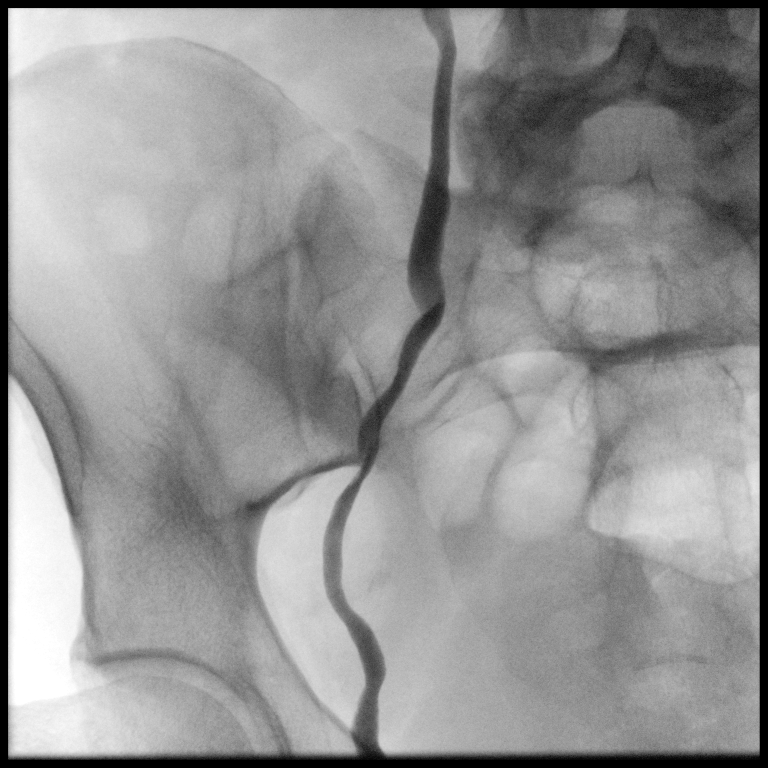

[Series 2: uro standard · 1 of 1 slices shown (2 of 3)]
[im 1/1]
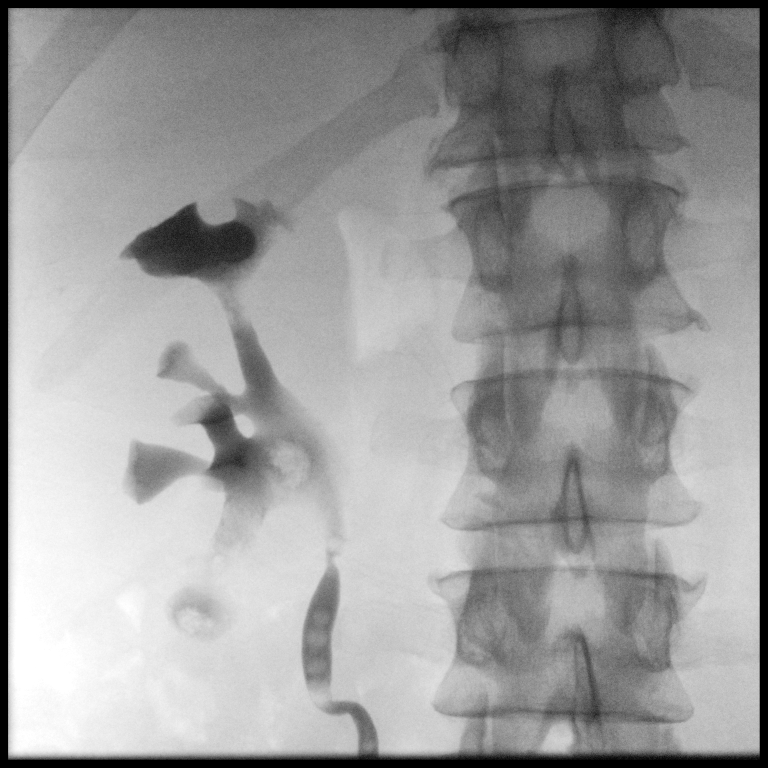

[Series 3: uro standard · 1 of 1 slices shown (3 of 3)]
[im 1/1]
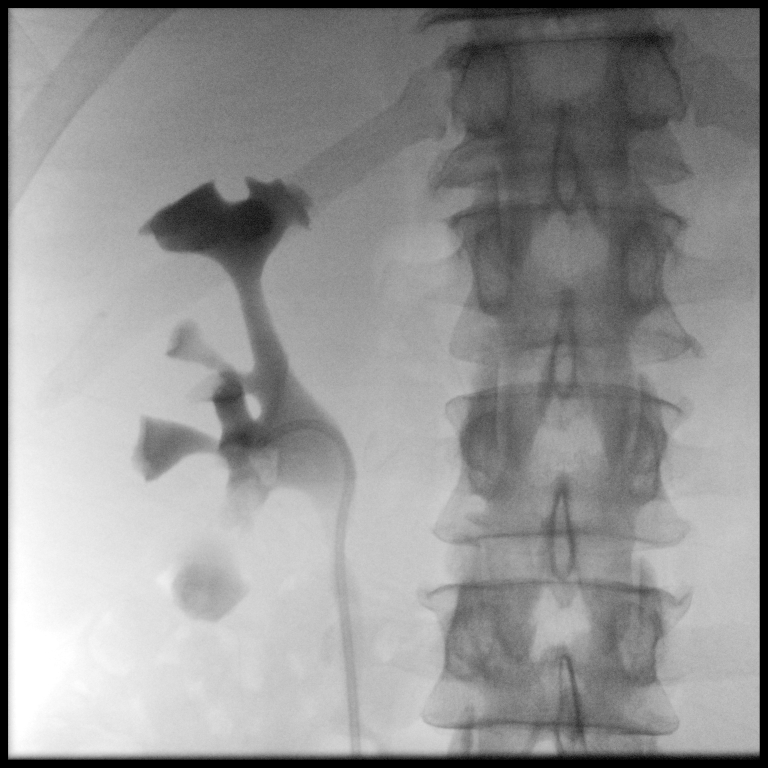

[3 of 3 positions shown; findings below may reference images not displayed]

FINDINGS: Intraoperative fluoroscopic spot images during retrograde pyelogram.

Initial image demonstrates partial opacification of the right ureter
and right collecting system. Images demonstrate filling defects in
the renal pelvis, inferior calyx and infundibulum. Blunting of the
caliceal fornices, compatible with hydronephrosis. Rounded filling
defects in the proximal ureter, potentially air bubbles or
additional stones.

Final image demonstrates placement of a stent with residual filling
defects in the collecting system.
IMPRESSION: Limited images during right-sided retrograde pyelogram demonstrates
pelvicaliectasis, multiple filling defects, potentially combination
of stones and air bubbles, and placement of a right-sided ureteral
stent. Please refer to the dictated operative report for full
details of intraoperative findings and procedure.

## 2019-04-20 ENCOUNTER — Emergency Department (HOSPITAL_COMMUNITY): Payer: Self-pay

## 2019-04-20 ENCOUNTER — Encounter (HOSPITAL_COMMUNITY): Payer: Self-pay | Admitting: Emergency Medicine

## 2019-04-20 ENCOUNTER — Other Ambulatory Visit: Payer: Self-pay

## 2019-04-20 ENCOUNTER — Inpatient Hospital Stay (HOSPITAL_COMMUNITY)
Admission: EM | Admit: 2019-04-20 | Discharge: 2019-04-22 | DRG: 247 | Disposition: A | Discharge status: Home / Self Care | Payer: Self-pay | Attending: Cardiology | Admitting: Cardiology

## 2019-04-20 DIAGNOSIS — I739 Peripheral vascular disease, unspecified: Secondary | ICD-10-CM | POA: Diagnosis present

## 2019-04-20 DIAGNOSIS — I714 Abdominal aortic aneurysm, without rupture, unspecified: Secondary | ICD-10-CM | POA: Diagnosis present

## 2019-04-20 DIAGNOSIS — Z7982 Long term (current) use of aspirin: Secondary | ICD-10-CM

## 2019-04-20 DIAGNOSIS — Z87891 Personal history of nicotine dependence: Secondary | ICD-10-CM

## 2019-04-20 DIAGNOSIS — Z888 Allergy status to other drugs, medicaments and biological substances status: Secondary | ICD-10-CM

## 2019-04-20 DIAGNOSIS — Z885 Allergy status to narcotic agent status: Secondary | ICD-10-CM

## 2019-04-20 DIAGNOSIS — I214 Non-ST elevation (NSTEMI) myocardial infarction: Principal | ICD-10-CM | POA: Diagnosis present

## 2019-04-20 DIAGNOSIS — Z9049 Acquired absence of other specified parts of digestive tract: Secondary | ICD-10-CM

## 2019-04-20 DIAGNOSIS — Z8249 Family history of ischemic heart disease and other diseases of the circulatory system: Secondary | ICD-10-CM

## 2019-04-20 DIAGNOSIS — R0989 Other specified symptoms and signs involving the circulatory and respiratory systems: Secondary | ICD-10-CM | POA: Diagnosis present

## 2019-04-20 DIAGNOSIS — Z880 Allergy status to penicillin: Secondary | ICD-10-CM

## 2019-04-20 DIAGNOSIS — Z79899 Other long term (current) drug therapy: Secondary | ICD-10-CM

## 2019-04-20 DIAGNOSIS — Z20828 Contact with and (suspected) exposure to other viral communicable diseases: Secondary | ICD-10-CM | POA: Diagnosis present

## 2019-04-20 DIAGNOSIS — Z955 Presence of coronary angioplasty implant and graft: Secondary | ICD-10-CM

## 2019-04-20 DIAGNOSIS — I10 Essential (primary) hypertension: Secondary | ICD-10-CM | POA: Diagnosis present

## 2019-04-20 DIAGNOSIS — I25110 Atherosclerotic heart disease of native coronary artery with unstable angina pectoris: Secondary | ICD-10-CM | POA: Diagnosis present

## 2019-04-20 DIAGNOSIS — R778 Other specified abnormalities of plasma proteins: Secondary | ICD-10-CM

## 2019-04-20 DIAGNOSIS — Z8673 Personal history of transient ischemic attack (TIA), and cerebral infarction without residual deficits: Secondary | ICD-10-CM

## 2019-04-20 DIAGNOSIS — Z23 Encounter for immunization: Secondary | ICD-10-CM

## 2019-04-20 LAB — HEPATIC FUNCTION PANEL
ALT: 19 U/L (ref 0–44)
AST: 25 U/L (ref 15–41)
Albumin: 5.1 g/dL — ABNORMAL HIGH (ref 3.5–5.0)
Alkaline Phosphatase: 87 U/L (ref 38–126)
Bilirubin, Direct: <0.1 mg/dL (ref 0.0–0.2)
Total Bilirubin: 0.5 mg/dL (ref 0.3–1.2)
Total Protein: 9 g/dL — ABNORMAL HIGH (ref 6.5–8.1)

## 2019-04-20 LAB — URINALYSIS, ROUTINE W REFLEX MICROSCOPIC
Bacteria, UA: NONE SEEN
Bilirubin Urine: NEGATIVE
Glucose, UA: NEGATIVE mg/dL
Ketones, ur: NEGATIVE mg/dL
Leukocytes,Ua: NEGATIVE
Nitrite: NEGATIVE
Protein, ur: NEGATIVE mg/dL
Specific Gravity, Urine: 1.001 — ABNORMAL LOW (ref 1.005–1.030)
pH: 6 (ref 5.0–8.0)

## 2019-04-20 LAB — TROPONIN I (HIGH SENSITIVITY)
Troponin I (High Sensitivity): 614 ng/L (ref <18)
Troponin I (High Sensitivity): 743 ng/L (ref <18)
Troponin I (High Sensitivity): 866 ng/L (ref <18)
Troponin I (High Sensitivity): 817 ng/L (ref <18)

## 2019-04-20 LAB — BASIC METABOLIC PANEL
Anion gap: 12 (ref 5–15)
BUN: 13 mg/dL (ref 6–20)
CO2: 22 mmol/L (ref 22–32)
Calcium: 10.1 mg/dL (ref 8.9–10.3)
Chloride: 102 mmol/L (ref 98–111)
Creatinine, Ser: 1.03 mg/dL — ABNORMAL HIGH (ref 0.44–1.00)
GFR calc Af Amer: >60 mL/min (ref >60)
GFR calc non Af Amer: >60 mL/min (ref >60)
Glucose, Bld: 107 mg/dL — ABNORMAL HIGH (ref 70–99)
Potassium: 3.4 mmol/L — ABNORMAL LOW (ref 3.5–5.1)
Sodium: 136 mmol/L (ref 135–145)

## 2019-04-20 LAB — BRAIN NATRIURETIC PEPTIDE: B Natriuretic Peptide: 65.4 pg/mL (ref 0.0–100.0)

## 2019-04-20 LAB — CBC
HCT: 48.5 % — ABNORMAL HIGH (ref 36.0–46.0)
Hemoglobin: 16.2 g/dL — ABNORMAL HIGH (ref 12.0–15.0)
MCH: 33.5 pg (ref 26.0–34.0)
MCHC: 33.4 g/dL (ref 30.0–36.0)
MCV: 100.4 fL — ABNORMAL HIGH (ref 80.0–100.0)
Platelets: 257 10*3/uL (ref 150–400)
RBC: 4.83 MIL/uL (ref 3.87–5.11)
RDW: 12.7 % (ref 11.5–15.5)
WBC: 9.5 10*3/uL (ref 4.0–10.5)
nRBC: 0 % (ref 0.0–0.2)

## 2019-04-20 LAB — HEMOGLOBIN A1C
Hgb A1c MFr Bld: 5.4 % (ref 4.8–5.6)
Mean Plasma Glucose: 108.28 mg/dL

## 2019-04-20 LAB — POC URINE PREG, ED: Preg Test, Ur: NEGATIVE

## 2019-04-20 LAB — SARS CORONAVIRUS 2 BY RT PCR (HOSPITAL ORDER, PERFORMED IN ~~LOC~~ HOSPITAL LAB): SARS Coronavirus 2: NEGATIVE

## 2019-04-20 LAB — LIPASE, BLOOD: Lipase: 30 U/L (ref 11–51)

## 2019-04-20 MED ORDER — HEPARIN BOLUS VIA INFUSION
3000.0000 [IU] | Freq: Once | INTRAVENOUS | Status: AC
  Administered 2019-04-20: 3000 [IU] via INTRAVENOUS
  Filled 2019-04-20: qty 3000

## 2019-04-20 MED ORDER — METOPROLOL TARTRATE 25 MG PO TABS
25.0000 mg | ORAL_TABLET | Freq: Two times a day (BID) | ORAL | Status: DC
  Administered 2019-04-20 – 2019-04-22 (×4): 25 mg via ORAL
  Filled 2019-04-20 (×4): qty 1

## 2019-04-20 MED ORDER — NAPHAZOLINE-PHENIRAMINE 0.025-0.3 % OP SOLN
1.0000 [drp] | OPHTHALMIC | Status: DC | PRN
  Filled 2019-04-20: qty 15

## 2019-04-20 MED ORDER — NITROGLYCERIN 0.4 MG SL SUBL
0.4000 mg | SUBLINGUAL_TABLET | SUBLINGUAL | Status: DC | PRN
  Administered 2019-04-21 (×2): 0.4 mg via SUBLINGUAL
  Filled 2019-04-20: qty 1

## 2019-04-20 MED ORDER — NITROGLYCERIN 0.4 MG SL SUBL
0.4000 mg | SUBLINGUAL_TABLET | Freq: Once | SUBLINGUAL | Status: AC
  Administered 2019-04-20: 0.4 mg via SUBLINGUAL
  Filled 2019-04-20: qty 1

## 2019-04-20 MED ORDER — INFLUENZA VAC SPLIT QUAD 0.5 ML IM SUSY: 0.5000 mL | PREFILLED_SYRINGE | INTRAMUSCULAR | Status: DC

## 2019-04-20 MED ORDER — ATORVASTATIN CALCIUM 40 MG PO TABS
40.0000 mg | ORAL_TABLET | Freq: Every day | ORAL | Status: DC
  Administered 2019-04-21: 09:00:00 40 mg via ORAL
  Filled 2019-04-20: qty 1

## 2019-04-20 MED ORDER — POTASSIUM CHLORIDE CRYS ER 20 MEQ PO TBCR
40.0000 meq | EXTENDED_RELEASE_TABLET | Freq: Once | ORAL | Status: AC
  Administered 2019-04-20: 21:00:00 40 meq via ORAL
  Filled 2019-04-20: qty 2

## 2019-04-20 MED ORDER — HEPARIN (PORCINE) 25000 UT/250ML-% IV SOLN
900.0000 [IU]/h | INTRAVENOUS | Status: DC
  Administered 2019-04-20: 650 [IU]/h via INTRAVENOUS
  Filled 2019-04-20: qty 250

## 2019-04-20 MED ORDER — ONDANSETRON HCL 4 MG/2ML IJ SOLN: 4.0000 mg | Freq: Four times a day (QID) | INTRAMUSCULAR | Status: DC | PRN

## 2019-04-20 MED ORDER — SODIUM CHLORIDE 0.9% FLUSH: 3.0000 mL | Freq: Once | INTRAVENOUS | Status: DC

## 2019-04-20 MED ORDER — ACETAMINOPHEN 325 MG PO TABS: 650.0000 mg | ORAL_TABLET | ORAL | Status: DC | PRN

## 2019-04-20 MED ORDER — ASPIRIN 81 MG PO CHEW
324.0000 mg | CHEWABLE_TABLET | Freq: Once | ORAL | Status: AC
  Administered 2019-04-20: 324 mg via ORAL
  Filled 2019-04-20: qty 4

## 2019-04-20 MED ORDER — ASPIRIN EC 81 MG PO TBEC
81.0000 mg | DELAYED_RELEASE_TABLET | Freq: Every day | ORAL | Status: DC
  Administered 2019-04-21: 81 mg via ORAL
  Filled 2019-04-20: qty 1

## 2019-04-20 MED ORDER — LISINOPRIL 10 MG PO TABS
10.0000 mg | ORAL_TABLET | Freq: Every day | ORAL | Status: DC
  Administered 2019-04-21 – 2019-04-22 (×2): 10 mg via ORAL
  Filled 2019-04-20 (×2): qty 1

## 2019-04-20 NOTE — ED Notes (Signed)
[facility] arrived  

## 2019-04-20 NOTE — ED Notes (Signed)
To Rad 

## 2019-04-20 NOTE — H&P (Addendum)
Cardiology Admission History and Physical:   Patient ID: Tara Bright MRN: [id number]; DOB: [birth year]   Admission date: 04/20/2019  Primary Care Provider: Caryl Bis, MD Primary Cardiologist: Kate Sable, MD  Primary Electrophysiologist:  None   Chief Complaint:  Chest pain   Patient Profile:   Tara Bright is a 53 y.o. female with a history of HTN, TIA, PAD, former tobacco abuse, AAA, who presented to the [facility] ED with chest pain concerning for NSTEMI.   History of Present Illness:   Tara Bright is a 53 y.o. female with a history of HTN, TIA, PAD, former tobacco abuse, AAA, who presented to the [facility] ED with chest pain concerning for NSTEMI.   The patient has no known cardiac disease although she has risk factors including HTN, prior TIA, PAD, and AAA. She was evaluated by Dr. Bronson Ing in March 2020 for preoperative evaluation prior to cholecystectomy. At that visit she reported chest discomfort that was atypical for angina. Echocardiogram was largely unremarkable, and stress testing was deferred due to normal exercise capacity. ASA and atorvastatin were started.   She presented to the [facility] ED today with chest pain and dyspnea. In the ED, ECG showed sinus rhythm rhythm with a PVC and subtle ST depressions in lateral precordial leads. Troponin was 614 with repeat measurement of 743. She was given ASA and NTG and accepted for transfer to [facility].  On arrival to [facility], BP 121/90 with HR 77. ECG was repeated and showed sinus rhythm with PVCs. She denies chest pain or any other symptoms on my assessment. She states that two days ago she began to experience a heaviness in her chest that radiated into her bilateral shoulders and arms. The pain was worse when she laid flat and improved with sitting forward and hugging herself. She denied exertional or pleuritic triggers. She had some associated diaphoresis and dyspnea but denied nausea. She has been chest pain free all day.  She states that she has never had chest pain like this in the past and that these symptoms are different than her prior chest pain.    Heart Pathway Score:     Past Medical History:  Diagnosis Date  . Abdominal aortic aneurysm (AAA) ([place])   . Abdominal pain   . Abnormal weight loss   . Ataxia 2016   and visual changes, left AMA before workup completed  . CAD (coronary atherosclerotic disease)   . Hypertension   . Kidney stone   . Loss of appetite   . Nicotine dependence   . Right upper quadrant pain   . UTI (urinary tract infection)     Past Surgical History:  Procedure Laterality Date  . CHOLECYSTECTOMY N/A 12/21/2018   Procedure: LAPAROSCOPIC CHOLECYSTECTOMY;  Surgeon: Virl Cagey, MD;  Location: [facility];  Service: General;  Laterality: N/A;  . CYSTOSCOPY W/ URETERAL STENT PLACEMENT Right 04/05/2017   Procedure: CYSTOSCOPY WITH RETROGRADE PYELOGRAM/URETERAL STENT PLACEMENT;  Surgeon: Lucas Mallow, MD;  Location: [facility];  Service: Urology;  Laterality: Right;  . CYSTOSCOPY/URETEROSCOPY/HOLMIUM LASER/STENT PLACEMENT Right 04/26/2017   Procedure: CYSTOSCOPY/URETEROSCOPY/HOLMIUM LASER/STENT PLACEMENT;  Surgeon: Lucas Mallow, MD;  Location: [facility];  Service: Urology;  Laterality: Right;  . LITHOTRIPSY       Medications Prior to Admission: Prior to Admission medications   Medication Sig Start Date End Date Taking? Authorizing Provider  aspirin EC 81 MG tablet Take 1 tablet (81 mg total) by mouth daily. 10/18/18   Kate Sable  A, MD  atorvastatin (LIPITOR) 20 MG tablet Take 1 tablet (20 mg total) by mouth daily. 10/18/18 01/16/19  Herminio Commons, MD  lisinopril (PRINIVIL,ZESTRIL) 10 MG tablet Take 10 mg by mouth daily.    [provider]  Multiple Vitamins-Minerals (MULTIVITAMIN WITH MINERALS) tablet Take 2 tablets by mouth daily. Gummies    [provider]  naphazoline-pheniramine (VISINE) 0.025-0.3 % ophthalmic solution Place 1 drop into  both eyes every morning.    [provider]     Allergies:    Allergies  Allergen Reactions  . Codeine Other (See Comments)    Gi upset  . Other Other (See Comments)    Just about all pain medications cause Nausea and Vomiting. Must take Phenergan before.   Marland Kitchen Penicillins Hives and Nausea And Vomiting    Did it involve swelling of the face/tongue/throat, SOB, or low BP? No Did it involve sudden or severe rash/hives, skin peeling, or any reaction on the inside of your mouth or nose? Yes Did you need to seek medical attention at a hospital or doctor's office? Yes When did it last happen?childhood  If all above answers are "NO", may proceed with cephalosporin use.     Social History:   Social History   Socioeconomic History  . Marital status: Married    Spouse name: Not on file  . Number of children: Not on file  . Years of education: Not on file  . Highest education level: Not on file  Occupational History  . Not on file  Social Needs  . Financial resource strain: Not on file  . Food insecurity    Worry: Not on file    Inability: Not on file  . Transportation needs    Medical: Not on file    Non-medical: Not on file  Tobacco Use  . Smoking status: Former Smoker    Packs/day: 0.50    Types: Cigarettes    Quit date: 10/04/2018    Years since quitting: 0.5  . Smokeless tobacco: Never Used  Substance and Sexual Activity  . Alcohol use: No  . Drug use: No  . Sexual activity: Yes    Birth control/protection: None  Lifestyle  . Physical activity    Days per week: Not on file    Minutes per session: Not on file  . Stress: Not on file  Relationships  . Social Herbalist on phone: Not on file    Gets together: Not on file    Attends religious service: Not on file    Active member of club or organization: Not on file    Attends meetings of clubs or organizations: Not on file    Relationship status: Not on file  . Intimate partner violence     Fear of current or ex partner: Not on file    Emotionally abused: Not on file    Physically abused: Not on file    Forced sexual activity: Not on file  Other Topics Concern  . Not on file  Social History Narrative  . Not on file    Family History:  Reports cardiac disease in parents and brother The patient's family history includes Diabetes in her father; Heart failure in her father; Stomach cancer in her mother.    ROS:  Please see the history of present illness.  All other ROS reviewed and negative.     Physical Exam/Data:   Vitals:   04/20/19 1500 04/20/19 1530 04/20/19 1645 04/20/19 1700  BP: (!) 138/98 108/77 116/79 130/76  Pulse: 77 73 73 68  Resp: (!) 23 17 18 11   Temp:      TempSrc:      SpO2: 96% 98% 97% 97%  Weight:      Height:       No intake or output data in the 24 hours ending 04/20/19 1729 Last 3 Weights 04/20/2019 01/03/2019 12/18/2018  Weight (lbs) 120 lb 115 lb 120 lb  Weight (kg) 54.432 kg 52.164 kg 54.432 kg     Body mass index is 21.95 kg/m.  General:  Well nourished, well developed, in no acute distress HEENT: normal Neck: no JVD Cardiac:  normal S1, S2; RRR; no murmur  Lungs:  clear to auscultation bilaterally, no wheezing, rhonchi or rales  Abd: soft, nontender Ext: no LE edema Musculoskeletal:  No deformities, BUE and BLE strength normal and equal Skin: warm and dry  Neuro:  No focal abnormalities noted Psych:  Normal affect    EKG:  The ECG that was done and was personally reviewed and demonstrates sinus rhythm rhythm with a PVC and subtle ST depressions in lateral precordial leads  Relevant CV Studies:  Echocardiogram 12/2018:  1. The left ventricle has hyperdynamic systolic function, with an ejection fraction of >65%. The cavity size was normal. Left ventricular diastolic parameters were normal.  2. The right ventricle has normal systolic function. The cavity was normal. There is no increase in right ventricular wall thickness.  3. The  aortic valve is tricuspid. Mild thickening of the aortic valve. Mild calcification of the aortic valve. No stenosis of the aortic valve. Mild aortic annular calcification noted.  4. The mitral valve is abnormal. Mild thickening of the mitral valve leaflet. Mild calcification of the mitral valve leaflet. There is mild mitral annular calcification present. No evidence of mitral valve stenosis.  5. The aortic root is normal in size and structure.  6. Pulmonary hypertension is indeterminate, inadequate TR jet.  ABI 08/2018: Mildly depressed left ankle-brachial index at rest with nearly monophasic dorsalis pedis waveform. Resting right ankle-brachial index is nearly normal with essentially normal distal waveforms.  Laboratory Data:  High Sensitivity Troponin:   Recent Labs  Lab 04/20/19 1222 04/20/19 1432  TROPONINIHS 614* 743*      Chemistry Recent Labs  Lab 04/20/19 1222  NA 136  K 3.4*  CL 102  CO2 22  GLUCOSE 107*  BUN 13  CREATININE 1.03*  CALCIUM 10.1  GFRNONAA >60  GFRAA >60  ANIONGAP 12    Recent Labs  Lab 04/20/19 1222  PROT 9.0*  ALBUMIN 5.1*  AST 25  ALT 19  ALKPHOS 87  BILITOT 0.5   Hematology Recent Labs  Lab 04/20/19 1222  WBC 9.5  RBC 4.83  HGB 16.2*  HCT 48.5*  MCV 100.4*  MCH 33.5  MCHC 33.4  RDW 12.7  PLT 257   BNPNo results for input(s): BNP, PROBNP in the last 168 hours.  DDimer No results for input(s): DDIMER in the last 168 hours.   Radiology/Studies:  Dg Chest 2 View  Result Date: 04/20/2019 CLINICAL DATA:  Chest pain EXAM: CHEST - 2 VIEW COMPARISON:  None. FINDINGS: The heart size and mediastinal contours are within normal limits. Both lungs are clear. The visualized skeletal structures are unremarkable. IMPRESSION: No acute abnormality of the lungs. Electronically Signed   By: Eddie Candle M.D.   On: 04/20/2019 13:33    Assessment and Plan:   Chest pain Concern for NSTEMI The  patient has no known CAD but has multiple risk  factors for CAD. She presented to the ED with chest pain that has typical and atypical features. Her pain notably has a positional component, but she does not have ECG or physical exam findings to suggest pericarditis. ECG shows some subtle ischemic changes, and troponin is elevated and rising. Her presentation is concerning for possible NSTEMI. She is currently chest pain free. At this time, will treat for ACS with anticipated ischemic evaluation. -Starting heparin gtt - S/p ASA 324 at the outside ED. Continue 81 mg daily -Increasing atorvastatin to 40 mg daily -Starting metoprolol 25 mg BID -Continue to trend troponin -HgA1c, lipid panel ordered -Will repeat echocardiogram -Anticipate LHC on Monday 9/28 Addendum: This morning at approximately 05:30, the patient experienced sudden onset recurrence of her chest pain. She was given morphine and started on NTG infusion with improvement of her symptoms. On my evaluation, the patient reports complete resolution of her chest pain.  Hx TIA -Continue ASA and increase atorvatatin as above  HTN BP controlled on arrival to [facility]. -Continue metoprolol -Continue lisinopril   AAA PAD -Continue to follow with vascular surgery  Severity of Illness: The appropriate patient status for this patient is INPATIENT. Inpatient status is judged to be reasonable and necessary in order to provide the required intensity of service to ensure the patient's safety. The patient's presenting symptoms, physical exam findings, and initial radiographic and laboratory data in the context of their chronic comorbidities is felt to place them at high risk for further clinical deterioration. Furthermore, it is not anticipated that the patient will be medically stable for discharge from the hospital within 2 midnights of admission. The following factors support the patient status of inpatient.   " The patient's presenting symptoms include chest pain. " The worrisome physical  exam findings include N/A. " The initial radiographic and laboratory data are worrisome because of elevated troponin. " The chronic co-morbidities include PAD.   * I certify that at the point of admission it is my clinical judgment that the patient will require inpatient hospital care spanning beyond 2 midnights from the point of admission due to high intensity of service, high risk for further deterioration and high frequency of surveillance required.*    For questions or updates, please contact [place] Please consult [email or web address] for contact info under        Signed, Nila Nephew, MD  04/20/2019 5:29 PM

## 2019-04-20 NOTE — Progress Notes (Signed)
ANTICOAGULATION CONSULT NOTE - Initial Consult  Pharmacy Consult for heparin Indication: chest pain/ACS  Allergies  Allergen Reactions  . Codeine Other (See Comments)    Gi upset  . Other Other (See Comments)    Just about all pain medications cause Nausea and Vomiting. Must take Phenergan before.   Marland Kitchen Penicillins Hives and Nausea And Vomiting    Did it involve swelling of the face/tongue/throat, SOB, or low BP? No Did it involve sudden or severe rash/hives, skin peeling, or any reaction on the inside of your mouth or nose? Yes Did you need to seek medical attention at a hospital or doctor's office? Yes When did it last happen?childhood  If all above answers are "NO", may proceed with cephalosporin use.     Patient Measurements: Height: 5\' 3"  (160 cm) Weight: 118 lb 1.6 oz (53.6 kg) IBW/kg (Calculated) : 52.4 Heparin Dosing Weight: 53kg  Vital Signs: Temp: 98.6 F (37 C) (09/26 1828) Temp Source: Oral (09/26 1828) BP: 121/90 (09/26 1828) Pulse Rate: 77 (09/26 1828)  Labs: Recent Labs    04/20/19 1222 04/20/19 1432  HGB 16.2*  --   HCT 48.5*  --   PLT 257  --   CREATININE 1.03*  --   TROPONINIHS 614* 743*    Estimated Creatinine Clearance: 52.3 mL/min (A) (by C-G formula based on SCr of 1.03 mg/dL (H)).   Medical History: Past Medical History:  Diagnosis Date  . Abdominal aortic aneurysm (AAA) ([place])   . Abdominal pain   . Abnormal weight loss   . Ataxia 2016   and visual changes, left AMA before workup completed  . CAD (coronary atherosclerotic disease)   . Hypertension   . Kidney stone   . Loss of appetite   . Nicotine dependence   . Right upper quadrant pain   . UTI (urinary tract infection)      Assessment: 29 yoF admitted with CP and slightly elevated troponins. Pharmacy asked to begin IV heparin. No AC noted PTA, H/H wnl.  Goal of Therapy:  Heparin level 0.3-0.7 units/ml Monitor platelets by anticoagulation protocol: Yes   Plan:   -Heparin 3000 units x1 -Heparin 650 units/hr -Check 8-hr heparin level -Monitor heparin level, CBC, S/Sx bleeding daily  Arrie Senate, PharmD, BCPS Clinical Pharmacist [phone] Please check AMION for all [place] numbers 04/20/2019

## 2019-04-20 NOTE — ED Notes (Signed)
Called [facility] for transport to [facility]. 

## 2019-04-20 NOTE — ED Notes (Signed)
ED TO INPATIENT HANDOFF REPORT  ED Nurse Name and Phone #:  Benay Spice Name/Age/Gender Tara Bright 53 y.o. female Room/Bed: APA12/APA12  Code Status   Code Status: Prior  Home/SNF/Other Home Patient oriented to: self Is this baseline? Yes   Triage Complete: Triage complete  Chief Complaint CHEST PAIN WITH HISTORY  Triage Note Patient c/o central chest pain that radiates into arms bilaterally. Per patient started yesterday with shortness of breath and weakness. Denies any nausea, vomiting, or dizziness. Per patient hx of aortic aneurysm that was found in January.Per patient took baby aspirin this morning.      Allergies Allergies  Allergen Reactions  . Codeine Other (See Comments)    Gi upset  . Other Other (See Comments)    Just about all pain medications cause Nausea and Vomiting. Must take Phenergan before.   Marland Kitchen Penicillins Hives and Nausea And Vomiting    Did it involve swelling of the face/tongue/throat, SOB, or low BP? No Did it involve sudden or severe rash/hives, skin peeling, or any reaction on the inside of your mouth or nose? Yes Did you need to seek medical attention at a hospital or doctor's office? Yes When did it last happen?childhood  If all above answers are "NO", may proceed with cephalosporin use.     Level of Care/Admitting Diagnosis ED Disposition    None      B Medical/Surgery History Past Medical History:  Diagnosis Date  . Abdominal aortic aneurysm (AAA) ([place])   . Abdominal pain   . Abnormal weight loss   . Ataxia 2016   and visual changes, left AMA before workup completed  . CAD (coronary atherosclerotic disease)   . Hypertension   . Kidney stone   . Loss of appetite   . Nicotine dependence   . Right upper quadrant pain   . UTI (urinary tract infection)    Past Surgical History:  Procedure Laterality Date  . CHOLECYSTECTOMY N/A 12/21/2018   Procedure: LAPAROSCOPIC CHOLECYSTECTOMY;  Surgeon: Virl Cagey,  MD;  Location: [facility];  Service: General;  Laterality: N/A;  . CYSTOSCOPY W/ URETERAL STENT PLACEMENT Right 04/05/2017   Procedure: CYSTOSCOPY WITH RETROGRADE PYELOGRAM/URETERAL STENT PLACEMENT;  Surgeon: Lucas Mallow, MD;  Location: [facility];  Service: Urology;  Laterality: Right;  . CYSTOSCOPY/URETEROSCOPY/HOLMIUM LASER/STENT PLACEMENT Right 04/26/2017   Procedure: CYSTOSCOPY/URETEROSCOPY/HOLMIUM LASER/STENT PLACEMENT;  Surgeon: Lucas Mallow, MD;  Location: [facility];  Service: Urology;  Laterality: Right;  . LITHOTRIPSY       A IV Location/Drains/Wounds Patient Lines/Drains/Airways Status   Active Line/Drains/Airways    Name:   Placement date:   Placement time:   Site:   Days:   Peripheral IV 04/20/19 Left Antecubital   04/20/19    1217    Antecubital   less than 1   Ureteral Drain/Stent Right ureter 6 Fr.   04/26/17    0910    Right ureter   724   Incision (Closed) 12/21/18 Abdomen   12/21/18    0801     120   Incision - 4 Ports Abdomen 1: Umbilicus 2: Upper;Medial 3: Right;Upper 4: Right;Lower   12/21/18    0748     120          Intake/Output Last 24 hours No intake or output data in the 24 hours ending 04/20/19 1703  Labs/Imaging Results for orders placed or performed during the hospital encounter of 04/20/19 (from the past 48 hour(s))  Basic metabolic  panel     Status: Abnormal   Collection Time: 04/20/19 12:22 PM  Result Value Ref Range   Sodium 136 135 - 145 mmol/L   Potassium 3.4 (L) 3.5 - 5.1 mmol/L   Chloride 102 98 - 111 mmol/L   CO2 22 22 - 32 mmol/L   Glucose, Bld 107 (H) 70 - 99 mg/dL   BUN 13 6 - 20 mg/dL   Creatinine, Ser 1.03 (H) 0.44 - 1.00 mg/dL   Calcium 10.1 8.9 - 10.3 mg/dL   GFR calc non Af Amer >60 >60 mL/min   GFR calc Af Amer >60 >60 mL/min   Anion gap 12 5 - 15    Comment: Performed at [facility], [address] [place], [place] [address]  CBC     Status: Abnormal   Collection Time: 04/20/19 12:22 PM  Result Value Ref Range   WBC 9.5  4.0 - 10.5 K/uL   RBC 4.83 3.87 - 5.11 MIL/uL   Hemoglobin 16.2 (H) 12.0 - 15.0 g/dL   HCT 48.5 (H) 36.0 - 46.0 %   MCV 100.4 (H) 80.0 - 100.0 fL   MCH 33.5 26.0 - 34.0 pg   MCHC 33.4 30.0 - 36.0 g/dL   RDW 12.7 11.5 - 15.5 %   Platelets 257 150 - 400 K/uL   nRBC 0.0 0.0 - 0.2 %    Comment: Performed at [facility], [address], [place], [place] [address]  Troponin I (High Sensitivity)     Status: Abnormal   Collection Time: 04/20/19 12:22 PM  Result Value Ref Range   Troponin I (High Sensitivity) 614 (HH) <18 ng/L    Comment: CRITICAL RESULT CALLED TO, READ BACK BY AND VERIFIED WITH: CRUISE A @ 1308 ON [id number] BY HENDERSON L. Performed at [facility], [address], [place], [place] [address]   Hepatic function panel     Status: Abnormal   Collection Time: 04/20/19 12:22 PM  Result Value Ref Range   Total Protein 9.0 (H) 6.5 - 8.1 g/dL   Albumin 5.1 (H) 3.5 - 5.0 g/dL   AST 25 15 - 41 U/L   ALT 19 0 - 44 U/L   Alkaline Phosphatase 87 38 - 126 U/L   Total Bilirubin 0.5 0.3 - 1.2 mg/dL   Bilirubin, Direct <0.1 0.0 - 0.2 mg/dL   Indirect Bilirubin NOT CALCULATED 0.3 - 0.9 mg/dL    Comment: Performed at [facility], [address], [place], [place] [address]  Lipase, blood     Status: None   Collection Time: 04/20/19 12:22 PM  Result Value Ref Range   Lipase 30 11 - 51 U/L    Comment: Performed at [facility], [address], [place], [place] [address]  Urinalysis, Routine w reflex microscopic     Status: Abnormal   Collection Time: 04/20/19  1:14 PM  Result Value Ref Range   Color, Urine COLORLESS (A) YELLOW   APPearance CLEAR CLEAR   Specific Gravity, Urine 1.001 (L) 1.005 - 1.030   pH 6.0 5.0 - 8.0   Glucose, UA NEGATIVE NEGATIVE mg/dL   Hgb urine dipstick MODERATE (A) NEGATIVE   Bilirubin Urine NEGATIVE NEGATIVE   Ketones, ur NEGATIVE NEGATIVE mg/dL   Protein, ur NEGATIVE NEGATIVE mg/dL   Nitrite NEGATIVE NEGATIVE   Leukocytes,Ua NEGATIVE NEGATIVE   Bacteria,  UA NONE SEEN NONE SEEN    Comment: Performed at [facility], [address] [place], [place] [address]  Troponin I (High Sensitivity)  Status: Abnormal   Collection Time: 04/20/19  2:32 PM  Result Value Ref Range   Troponin I (High Sensitivity) 743 (HH) <18 ng/L    Comment: CRITICAL RESULT CALLED TO, READ BACK BY AND VERIFIED WITH: CRUISE A. @ 1526 ON [id number] BY HENDERSON L. Performed at [facility], [address] [place], [place] [address]   SARS Coronavirus 2 [facility] order, Performed in [facility] hospital lab) Nasopharyngeal Nasopharyngeal Swab     Status: None   Collection Time: 04/20/19  2:42 PM   Specimen: Nasopharyngeal Swab  Result Value Ref Range   SARS Coronavirus 2 NEGATIVE NEGATIVE    Comment: (NOTE) If result is NEGATIVE SARS-CoV-2 target nucleic acids are NOT DETECTED. The SARS-CoV-2 RNA is generally detectable in upper and lower  respiratory specimens during the acute phase of infection. The lowest  concentration of SARS-CoV-2 viral copies this assay can detect is 250  copies / mL. A negative result does not preclude SARS-CoV-2 infection  and should not be used as the sole basis for treatment or other  patient management decisions.  A negative result may occur with  improper specimen collection / handling, submission of specimen other  than nasopharyngeal swab, presence of viral mutation(s) within the  areas targeted by this assay, and inadequate number of viral copies  (<250 copies / mL). A negative result must be combined with clinical  observations, patient history, and epidemiological information. If result is POSITIVE SARS-CoV-2 target nucleic acids are DETECTED. The SARS-CoV-2 RNA is generally detectable in upper and lower  respiratory specimens dur ing the acute phase of infection.  Positive  results are indicative of active infection with SARS-CoV-2.  Clinical  correlation with patient history and other diagnostic information is  necessary to  determine patient infection status.  Positive results do  not rule out bacterial infection or co-infection with other viruses. If result is PRESUMPTIVE POSTIVE SARS-CoV-2 nucleic acids MAY BE PRESENT.   A presumptive positive result was obtained on the submitted specimen  and confirmed on repeat testing.  While 2019 novel coronavirus  (SARS-CoV-2) nucleic acids may be present in the submitted sample  additional confirmatory testing may be necessary for epidemiological  and / or clinical management purposes  to differentiate between  SARS-CoV-2 and other Sarbecovirus currently known to infect humans.  If clinically indicated additional testing with an alternate test  methodology [phone]) is advised. The SARS-CoV-2 RNA is generally  detectable in upper and lower respiratory sp ecimens during the acute  phase of infection. The expected result is Negative. Fact Sheet for Patients:  StrictlyIdeas.no Fact Sheet for Healthcare Providers: BankingDealers.co.za This test is not yet approved or cleared by the [place] FDA and has been authorized for detection and/or diagnosis of SARS-CoV-2 by FDA under an Emergency Use Authorization (EUA).  This EUA will remain in effect (meaning this test can be used) for the duration of the COVID-19 declaration under Section 564(b)(1) of the Act, 21 U.S.C. section 360bbb-3(b)(1), unless the authorization is terminated or revoked sooner. Performed at [facility], [address], [place],  [address]    Dg Chest 2 View  Result Date: 04/20/2019 CLINICAL DATA:  Chest pain EXAM: CHEST - 2 VIEW COMPARISON:  None. FINDINGS: The heart size and mediastinal contours are within normal limits. Both lungs are clear. The visualized skeletal structures are unremarkable. IMPRESSION: No acute abnormality of the lungs. Electronically Signed   By: Eddie Candle M.D.   On: 04/20/2019 13:33    Pending Labs Unresulted  Labs  (From admission, onward)    Start     Ordered   04/20/19 1305  Urine culture  ONCE - STAT,   STAT     04/20/19 1305          Vitals/Pain Today's Vitals   04/20/19 1500 04/20/19 1530 04/20/19 1645 04/20/19 1700  BP: (!) 138/98 108/77 116/79 130/76  Pulse: 77 73 73 68  Resp: (!) 23 17 18 11   Temp:      TempSrc:      SpO2: 96% 98% 97% 97%  Weight:      Height:      PainSc:        Isolation Precautions No active isolations  Medications Medications  aspirin chewable tablet 324 mg (324 mg Oral Given 04/20/19 1315)  nitroGLYCERIN (NITROSTAT) SL tablet 0.4 mg (0.4 mg Sublingual Given 04/20/19 1506)    Mobility walks Low fall risk   Focused Assessments Cardiac Assessment Handoff:    Lab Results  Component Value Date   TROPONINI <0.03 09/21/2014   No results found for: DDIMER Does the Patient currently have chest pain? No     R Recommendations: See Admitting Provider Note  Report given to: Zanab, RN  Additional Notes: n/a

## 2019-04-20 NOTE — ED Notes (Signed)
Patient transported to X-ray 

## 2019-04-20 NOTE — ED Notes (Signed)
Date and time results received: 04/20/19 3:26 PM  (use smartphrase ".now" to insert current time)  Test: Troponin Critical Value: 743  Name of Provider Notified: Zackowski  Orders Received? Or Actions Taken?: Orders Received - See Orders for details

## 2019-04-20 NOTE — ED Triage Notes (Signed)
Patient c/o central chest pain that radiates into arms bilaterally. Per patient started yesterday with shortness of breath and weakness. Denies any nausea, vomiting, or dizziness. Per patient hx of aortic aneurysm that was found in January.Per patient took baby aspirin this morning.

## 2019-04-20 NOTE — ED Provider Notes (Signed)
[facility] EMERGENCY DEPARTMENT Provider Note   CSN: [id number] Arrival date & time: 04/20/19  1151     History   Chief Complaint Chief Complaint  Patient presents with  . Chest Pain    HPI Tara Bright is a 53 y.o. female with history of chronic cholecystitis s/p lap cholecystectomy 11/2018, HTN, previous tobacco use, TIA, presents to the ER for evaluation of chest discomfort.  Described as "pressure pain".  Located to the upper chest bilaterally.  She has associated upper bilateral arm soreness.  Has noticed mild shortness of breath described as feeling winded with moving, walking that began yesterday.  She also had 3 episodes of very deep, intense hiccups yesterday even without eating.  Has noticed upper abdominal pain when she sits up.  Last night she tried to eat something and as she was swallowing it hurt her upper chest, after few seconds this went away.  The chest discomfort is intermittent but has now been more persistent over the last 24 hours.  It is significantly worse when she lays flat down and better if she is propped up and sitting up.  She has no chest pain with walking or exertion.  She has chronic diarrhea that she attributes to gallbladder issues that is unchanged.  No interventions.  She last smoked tobacco March 2020.  She had an incidental finding of a AAA earlier this year currently on surveillance.  She will followed up with vascular surgery who told her she had carotid artery stenosis and poor circulation in her hands and ankles.  She denies any associated fever, congestion, sore throat, cough, nausea, vomiting.  No diaphoresis, jaw pain, back pain, palpitations, syncope, orthopnea or PND.  No lower extremity edema, calf pain.  No history of DVT or PE.  No recent surgery, prolonged travel or immobilization, hemoptysis, hormone therapy, active cancer.  No pleuritic chest pain.     HPI  Past Medical History:  Diagnosis Date  . Abdominal aortic aneurysm (AAA) ([place])    . Abdominal pain   . Abnormal weight loss   . Ataxia 2016   and visual changes, left AMA before workup completed  . CAD (coronary atherosclerotic disease)   . Hypertension   . Kidney stone   . Loss of appetite   . Nicotine dependence   . Right upper quadrant pain   . UTI (urinary tract infection)     Patient Active Problem List   Diagnosis Date Noted  . Chronic cholecystitis 09/06/2018  . PAD (peripheral artery disease) ([place]) 09/06/2018  . AAA (abdominal aortic aneurysm) without rupture ([place]) 09/06/2018  . Bruit of left carotid artery 09/06/2018  . Asymmetric blood pressures 09/06/2018  . UTI (urinary tract infection) 04/05/2017  . Hyponatremia 04/05/2017  . Kidney stone 04/05/2017  . Hydronephrosis 04/05/2017  . Essential hypertension 04/05/2017  . Elevated blood pressure reading 04/05/2017    Past Surgical History:  Procedure Laterality Date  . CHOLECYSTECTOMY N/A 12/21/2018   Procedure: LAPAROSCOPIC CHOLECYSTECTOMY;  Surgeon: Virl Cagey, MD;  Location: [facility];  Service: General;  Laterality: N/A;  . CYSTOSCOPY W/ URETERAL STENT PLACEMENT Right 04/05/2017   Procedure: CYSTOSCOPY WITH RETROGRADE PYELOGRAM/URETERAL STENT PLACEMENT;  Surgeon: Lucas Mallow, MD;  Location: [facility];  Service: Urology;  Laterality: Right;  . CYSTOSCOPY/URETEROSCOPY/HOLMIUM LASER/STENT PLACEMENT Right 04/26/2017   Procedure: CYSTOSCOPY/URETEROSCOPY/HOLMIUM LASER/STENT PLACEMENT;  Surgeon: Lucas Mallow, MD;  Location: [facility];  Service: Urology;  Laterality: Right;  . LITHOTRIPSY  OB History    Gravida  5   Para  2   Term  2   Preterm      AB  3   Living        SAB  3   TAB      Ectopic      Multiple      Live Births               Home Medications    Prior to Admission medications   Medication Sig Start Date End Date Taking? Authorizing Provider  aspirin EC 81 MG tablet Take 1 tablet (81 mg total) by mouth daily. 10/18/18   Herminio Commons, MD   atorvastatin (LIPITOR) 20 MG tablet Take 1 tablet (20 mg total) by mouth daily. 10/18/18 01/16/19  Herminio Commons, MD  lisinopril (PRINIVIL,ZESTRIL) 10 MG tablet Take 10 mg by mouth daily.    [provider]  Multiple Vitamins-Minerals (MULTIVITAMIN WITH MINERALS) tablet Take 2 tablets by mouth daily. Gummies    [provider]  naphazoline-pheniramine (VISINE) 0.025-0.3 % ophthalmic solution Place 1 drop into both eyes every morning.    [provider]    Family History Family History  Problem Relation Age of Onset  . Stomach cancer Mother   . Diabetes Father   . Heart failure Father     Social History Social History   Tobacco Use  . Smoking status: Former Smoker    Packs/day: 0.50    Types: Cigarettes    Quit date: 10/04/2018    Years since quitting: 0.5  . Smokeless tobacco: Never Used  Substance Use Topics  . Alcohol use: No  . Drug use: No     Allergies   Codeine, Other, and Penicillins   Review of Systems Review of Systems  Respiratory: Positive for shortness of breath.   Cardiovascular: Positive for chest pain.  Gastrointestinal: Positive for abdominal pain and diarrhea (chronic).       Hiccups   All other systems reviewed and are negative.    Physical Exam Updated Vital Signs BP 108/77   Pulse 73   Temp 98 F (36.7 C) (Oral)   Resp 17   Ht 5\' 2"  (1.575 m)   Wt 54.4 kg   LMP 03/08/2017   SpO2 98%   BMI 21.95 kg/m   Physical Exam Constitutional:      Appearance: She is well-developed.     Comments: NAD. Non toxic.   HENT:     Head: Normocephalic and atraumatic.     Nose: Nose normal.  Eyes:     General: Lids are normal.     Conjunctiva/sclera: Conjunctivae normal.  Neck:     Musculoskeletal: Normal range of motion.     Trachea: Trachea normal.     Comments: Trachea midline.  Cardiovascular:     Rate and Rhythm: Normal rate and regular rhythm.     Pulses:          Radial pulses are 1+ on the right side and  1+ on the left side.       Dorsalis pedis pulses are 1+ on the right side and 1+ on the left side.     Heart sounds: Normal heart sounds, S1 normal and S2 normal.     Comments: No LE edema or calf tenderness.  No murmurs. Pulmonary:     Effort: Pulmonary effort is normal.     Breath sounds: Normal breath sounds.  Abdominal:  General: Bowel sounds are normal.     Palpations: Abdomen is soft.     Tenderness: There is abdominal tenderness.     Comments: Diffuse upper abdominal tenderness.  Focal suprapubic tenderness.  No guarding, rigidity, rebound.  No distention.  No pulsatile mass.  Soft.  Active bowel sounds  Skin:    General: Skin is warm and dry.     Capillary Refill: Capillary refill takes less than 2 seconds.  Neurological:     Mental Status: She is alert.     GCS: GCS eye subscore is 4. GCS verbal subscore is 5. GCS motor subscore is 6.     Comments: Sensation and strength is intact in upper and lower extremities  Psychiatric:        Speech: Speech normal.        Behavior: Behavior normal.        Thought Content: Thought content normal.      ED Treatments / Results  Labs (all labs ordered are listed, but only abnormal results are displayed) Labs Reviewed  BASIC METABOLIC PANEL - Abnormal; Notable for the following components:      Result Value   Potassium 3.4 (*)    Glucose, Bld 107 (*)    Creatinine, Ser 1.03 (*)    All other components within normal limits  CBC - Abnormal; Notable for the following components:   Hemoglobin 16.2 (*)    HCT 48.5 (*)    MCV 100.4 (*)    All other components within normal limits  HEPATIC FUNCTION PANEL - Abnormal; Notable for the following components:   Total Protein 9.0 (*)    Albumin 5.1 (*)    All other components within normal limits  URINALYSIS, ROUTINE W REFLEX MICROSCOPIC - Abnormal; Notable for the following components:   Color, Urine COLORLESS (*)    Specific Gravity, Urine 1.001 (*)    Hgb urine dipstick MODERATE (*)     All other components within normal limits  TROPONIN I (HIGH SENSITIVITY) - Abnormal; Notable for the following components:   Troponin I (High Sensitivity) 614 (*)    All other components within normal limits  TROPONIN I (HIGH SENSITIVITY) - Abnormal; Notable for the following components:   Troponin I (High Sensitivity) 743 (*)    All other components within normal limits  URINE CULTURE  SARS CORONAVIRUS 2 (HOSPITAL ORDER, [facility])  LIPASE, BLOOD  POC URINE PREG, ED    EKG EKG Interpretation  Date/Time:  Saturday April 20 2019 12:05:51 EDT Ventricular Rate:  92 PR Interval:    QRS Duration: 76 QT Interval:  361 QTC Calculation: 447 R Axis:   70 Text Interpretation:  Sinus rhythm Ventricular premature complex Borderline repolarization abnormality ST depression V3, V4 Confirmed by Fredia Sorrow [phone]) on 04/20/2019 12:41:13 PM   Radiology Dg Chest 2 View  Result Date: 04/20/2019 CLINICAL DATA:  Chest pain EXAM: CHEST - 2 VIEW COMPARISON:  None. FINDINGS: The heart size and mediastinal contours are within normal limits. Both lungs are clear. The visualized skeletal structures are unremarkable. IMPRESSION: No acute abnormality of the lungs. Electronically Signed   By: Eddie Candle M.D.   On: 04/20/2019 13:33    Procedures .Critical Care Performed by: Kinnie Feil, PA-C Authorized by: Kinnie Feil, PA-C   Critical care provider statement:    Critical care time (minutes):  45   Critical care was necessary to treat or prevent imminent or life-threatening deterioration of the following conditions:  Cardiac failure (elevated troponin, requiring admission for cardiac work up)   Critical care was time spent personally by me on the following activities:  Discussions with consultants, evaluation of patient's response to treatment, examination of patient, ordering and performing treatments and interventions, ordering and review of  laboratory studies, ordering and review of radiographic studies, pulse oximetry, re-evaluation of patient's condition, obtaining history from patient or surrogate, review of old charts and development of treatment plan with patient or surrogate   I assumed direction of critical care for this patient from another provider in my specialty: no     (including critical care time)  Medications Ordered in ED Medications  aspirin chewable tablet 324 mg (324 mg Oral Given 04/20/19 1315)  nitroGLYCERIN (NITROSTAT) SL tablet 0.4 mg (0.4 mg Sublingual Given 04/20/19 1506)     Initial Impression / Assessment and Plan / ED Course  I have reviewed the triage vital signs and the nursing notes.  Pertinent labs & imaging results that were available during my care of the patient were reviewed by me and considered in my medical decision making (see chart for details).  Clinical Course as of Apr 19 1549  Sat Apr 20, 2019  1350 Troponin I (High Sensitivity)(!!): 614 [CG]  1350 Creatinine(!): 1.03 [CG]  1350 Hemoglobin(!): 16.2 [CG]  1350 HCT(!): 48.5 [CG]  1351 Sinus rhythm Ventricular premature complex Borderline repolarization abnormality ST depression V3, V4 Confirmed by Fredia Sorrow [phone]) on 04/20/2019 12:41:13 PM  EKG 12-Lead [CG]  1440 Spoke to Dr Percival Spanish agrees with admission. Recommends transfer to [facility], cardiology will admit. Long wait time for beds.    [CG]  1548 Troponin I (High Sensitivity)(!!): 743 [CG]    Clinical Course User Index [CG] Kinnie Feil, PA-C    53 yo here with both typical and atypical features of CP.  CP is pressure, across upper chest, worse with laying flat better with sitting up. She has dyspnea on exertion but no chest pain with exertion. +Hiccups, upper abdominal tenderness, bilateral upper arm soreness.  Cardiac risk factors include HTN, tobacco use, family history.   I have reviewed pt's EMR to obtain pertinent PMH. Seen by cardiology 6 months ago. Echo  12/2018 reviewed showed EF 65% and normal systolic functions of LV and RV, mild AV and MV thickening and calcification but no stenosis. US carotids 08/2018 showed < 50 % bilateral CAS.   Ddx includes ACS/unstable angina.  Her story is very atypical but she also has some red flag symptoms and risk factors for CAD.  Considered GI process as well. No recent illness or rub on exam to suggest pericarditis.  No risk factors for PE.  Her abd is mildly diffusely tender but no pulsatility, neuro or pulse deficits or back pain and issues with AAA considered less likely.   1400: ER work up reviewed by me. Initial EKG reviewed by me and EDP shows subtle questinable STD in V3/4 in setting of baseline wander.  Trop 614 at which point EKG was repeated and now without any STD.  Labs and CXR unremarkable.  ASA given. Re-evaluated pt and provided update.  She had brief CP episode a while ago but none currently, will give nitroglycerin. Cardiology consult pending.   1545: I have spoken to Dr. Percival Spanish with cardiology who agrees with admission.  We will transfer to [facility].  COVID test is pending.  Repeat troponin 743.  Patient reevaluated and she denies any current chest pain.  Because of this nitroglycerin has  not been given.  Patient is awaiting transfer to [facility]. Discussed with EDP.  Final Clinical Impressions(s) / ED Diagnoses   Final diagnoses:  Elevated troponin    ED Discharge Orders    None       Arlean Hopping 04/20/19 1550    Fredia Sorrow, MD 04/22/19 [phone]

## 2019-04-20 NOTE — ED Notes (Signed)
Date and time results received: 04/20/19 1:08 PM  (use smartphrase ".now" to insert current time)  Test: Troponin Critical Value: 614  Name of Provider Notified: Zackowski  Orders Received? Or Actions Taken?: Orders Received - See Orders for details

## 2019-04-21 ENCOUNTER — Encounter (HOSPITAL_COMMUNITY)
Admission: EM | Disposition: A | Discharge status: Home / Self Care | Payer: Self-pay | Source: Home / Self Care | Attending: Cardiology

## 2019-04-21 ENCOUNTER — Other Ambulatory Visit (HOSPITAL_COMMUNITY): Payer: Self-pay

## 2019-04-21 DIAGNOSIS — I25110 Atherosclerotic heart disease of native coronary artery with unstable angina pectoris: Secondary | ICD-10-CM

## 2019-04-21 HISTORY — PX: CORONARY STENT INTERVENTION: CATH118234

## 2019-04-21 HISTORY — PX: LEFT HEART CATH AND CORONARY ANGIOGRAPHY: CATH118249

## 2019-04-21 LAB — HEPARIN LEVEL (UNFRACTIONATED)
Heparin Unfractionated: 0.13 IU/mL — ABNORMAL LOW (ref 0.30–0.70)
Heparin Unfractionated: 0.21 IU/mL — ABNORMAL LOW (ref 0.30–0.70)

## 2019-04-21 LAB — CBC
HCT: 41.2 % (ref 36.0–46.0)
HCT: 40.2 % (ref 36.0–46.0)
Hemoglobin: 14.2 g/dL (ref 12.0–15.0)
Hemoglobin: 13.8 g/dL (ref 12.0–15.0)
MCH: 34 pg (ref 26.0–34.0)
MCH: 33.8 pg (ref 26.0–34.0)
MCHC: 34.5 g/dL (ref 30.0–36.0)
MCHC: 34.3 g/dL (ref 30.0–36.0)
MCV: 98.6 fL (ref 80.0–100.0)
MCV: 98.5 fL (ref 80.0–100.0)
Platelets: 250 10*3/uL (ref 150–400)
Platelets: 248 10*3/uL (ref 150–400)
RBC: 4.18 MIL/uL (ref 3.87–5.11)
RBC: 4.08 MIL/uL (ref 3.87–5.11)
RDW: 12.7 % (ref 11.5–15.5)
RDW: 12.7 % (ref 11.5–15.5)
WBC: 8.5 10*3/uL (ref 4.0–10.5)
WBC: 9.4 10*3/uL (ref 4.0–10.5)
nRBC: 0 % (ref 0.0–0.2)
nRBC: 0 % (ref 0.0–0.2)

## 2019-04-21 LAB — BASIC METABOLIC PANEL
Anion gap: 9 (ref 5–15)
BUN: 11 mg/dL (ref 6–20)
CO2: 23 mmol/L (ref 22–32)
Calcium: 9.3 mg/dL (ref 8.9–10.3)
Chloride: 105 mmol/L (ref 98–111)
Creatinine, Ser: 0.82 mg/dL (ref 0.44–1.00)
GFR calc Af Amer: >60 mL/min (ref >60)
GFR calc non Af Amer: >60 mL/min (ref >60)
Glucose, Bld: 99 mg/dL (ref 70–99)
Potassium: 4.3 mmol/L (ref 3.5–5.1)
Sodium: 137 mmol/L (ref 135–145)

## 2019-04-21 LAB — CREATININE, SERUM
Creatinine, Ser: 0.83 mg/dL (ref 0.44–1.00)
GFR calc Af Amer: >60 mL/min (ref >60)
GFR calc non Af Amer: >60 mL/min (ref >60)

## 2019-04-21 LAB — LIPID PANEL
Cholesterol: 156 mg/dL (ref 0–200)
HDL: 27 mg/dL — ABNORMAL LOW (ref >40)
LDL Cholesterol: 71 mg/dL (ref 0–99)
Total CHOL/HDL Ratio: 5.8 RATIO
Triglycerides: 290 mg/dL — ABNORMAL HIGH (ref <150)
VLDL: 58 mg/dL — ABNORMAL HIGH (ref 0–40)

## 2019-04-21 LAB — TROPONIN I (HIGH SENSITIVITY)
Troponin I (High Sensitivity): 387 ng/L (ref <18)
Troponin I (High Sensitivity): 1280 ng/L (ref <18)
Troponin I (High Sensitivity): 2061 ng/L (ref <18)

## 2019-04-21 LAB — HIV ANTIBODY (ROUTINE TESTING W REFLEX): HIV Screen 4th Generation wRfx: NONREACTIVE

## 2019-04-21 SURGERY — LEFT HEART CATH AND CORONARY ANGIOGRAPHY
Anesthesia: LOCAL

## 2019-04-21 MED ORDER — SODIUM CHLORIDE 0.9% FLUSH
3.0000 mL | Freq: Two times a day (BID) | INTRAVENOUS | Status: DC
  Administered 2019-04-21 – 2019-04-22 (×2): 3 mL via INTRAVENOUS

## 2019-04-21 MED ORDER — TIROFIBAN HCL IN NACL 5-0.9 MG/100ML-% IV SOLN
0.1500 ug/kg/min | INTRAVENOUS | Status: AC
  Filled 2019-04-21: qty 100

## 2019-04-21 MED ORDER — ATORVASTATIN CALCIUM 80 MG PO TABS
80.0000 mg | ORAL_TABLET | Freq: Every day | ORAL | Status: DC
  Administered 2019-04-21: 22:00:00 80 mg via ORAL
  Filled 2019-04-21: qty 1

## 2019-04-21 MED ORDER — HEPARIN (PORCINE) IN NACL 1000-0.9 UT/500ML-% IV SOLN
INTRAVENOUS | Status: AC
  Filled 2019-04-21: qty 1000

## 2019-04-21 MED ORDER — ACETAMINOPHEN 325 MG PO TABS: 650.0000 mg | ORAL_TABLET | ORAL | Status: DC | PRN

## 2019-04-21 MED ORDER — ALPRAZOLAM 0.25 MG PO TABS
0.2500 mg | ORAL_TABLET | Freq: Three times a day (TID) | ORAL | Status: DC | PRN
  Administered 2019-04-21: 08:00:00 0.25 mg via ORAL
  Filled 2019-04-21: qty 1

## 2019-04-21 MED ORDER — VERAPAMIL HCL 2.5 MG/ML IV SOLN
INTRAVENOUS | Status: DC | PRN
  Administered 2019-04-21: 15:00:00 10 mL via INTRA_ARTERIAL

## 2019-04-21 MED ORDER — TIROFIBAN HCL IN NACL 5-0.9 MG/100ML-% IV SOLN
INTRAVENOUS | Status: AC
  Filled 2019-04-21: qty 100

## 2019-04-21 MED ORDER — MORPHINE SULFATE (PF) 2 MG/ML IV SOLN
2.0000 mg | INTRAVENOUS | Status: DC | PRN
  Administered 2019-04-21 (×2): 2 mg via INTRAVENOUS
  Filled 2019-04-21 (×2): qty 1

## 2019-04-21 MED ORDER — SODIUM CHLORIDE 0.9% FLUSH: 3.0000 mL | INTRAVENOUS | Status: DC | PRN

## 2019-04-21 MED ORDER — SODIUM CHLORIDE 0.9 % IV SOLN: INTRAVENOUS | Status: AC

## 2019-04-21 MED ORDER — TICAGRELOR 90 MG PO TABS
ORAL_TABLET | ORAL | Status: DC | PRN
  Administered 2019-04-21: 180 mg via ORAL

## 2019-04-21 MED ORDER — SODIUM CHLORIDE 0.9 % WEIGHT BASED INFUSION: 1.0000 mL/kg/h | INTRAVENOUS | Status: DC

## 2019-04-21 MED ORDER — ONDANSETRON HCL 4 MG/2ML IJ SOLN: 4.0000 mg | Freq: Four times a day (QID) | INTRAMUSCULAR | Status: DC | PRN

## 2019-04-21 MED ORDER — TIROFIBAN HCL IN NACL 5-0.9 MG/100ML-% IV SOLN
INTRAVENOUS | Status: AC | PRN
  Administered 2019-04-21: 0.15 ug/kg/min via INTRAVENOUS

## 2019-04-21 MED ORDER — TICAGRELOR 90 MG PO TABS
90.0000 mg | ORAL_TABLET | Freq: Two times a day (BID) | ORAL | Status: DC
  Administered 2019-04-21 – 2019-04-22 (×2): 90 mg via ORAL
  Filled 2019-04-21 (×2): qty 1

## 2019-04-21 MED ORDER — MIDAZOLAM HCL 2 MG/2ML IJ SOLN
INTRAMUSCULAR | Status: AC
  Filled 2019-04-21: qty 2

## 2019-04-21 MED ORDER — NITROGLYCERIN IN D5W 200-5 MCG/ML-% IV SOLN
2.0000 ug/min | INTRAVENOUS | Status: DC
  Administered 2019-04-21: 5 ug/min via INTRAVENOUS
  Administered 2019-04-21: 08:00:00 15 ug/min via INTRAVENOUS
  Filled 2019-04-21: qty 250

## 2019-04-21 MED ORDER — HEPARIN BOLUS VIA INFUSION
1500.0000 [IU] | Freq: Once | INTRAVENOUS | Status: AC
  Administered 2019-04-21: 1500 [IU] via INTRAVENOUS
  Filled 2019-04-21: qty 1500

## 2019-04-21 MED ORDER — SODIUM CHLORIDE 0.9 % IV SOLN: 250.0000 mL | INTRAVENOUS | Status: DC | PRN

## 2019-04-21 MED ORDER — NITROGLYCERIN 1 MG/10 ML FOR IR/CATH LAB
INTRA_ARTERIAL | Status: DC | PRN
  Administered 2019-04-21: 200 ug via INTRACORONARY

## 2019-04-21 MED ORDER — HEPARIN SODIUM (PORCINE) 1000 UNIT/ML IJ SOLN
INTRAMUSCULAR | Status: DC | PRN
  Administered 2019-04-21: 6000 [IU] via INTRAVENOUS
  Administered 2019-04-21: 3000 [IU] via INTRAVENOUS

## 2019-04-21 MED ORDER — HEPARIN (PORCINE) IN NACL 1000-0.9 UT/500ML-% IV SOLN
INTRAVENOUS | Status: DC | PRN
  Administered 2019-04-21 (×2): 500 mL

## 2019-04-21 MED ORDER — SODIUM CHLORIDE 0.9% FLUSH: 3.0000 mL | Freq: Two times a day (BID) | INTRAVENOUS | Status: DC

## 2019-04-21 MED ORDER — TIROFIBAN (AGGRASTAT) BOLUS VIA INFUSION
INTRAVENOUS | Status: DC | PRN
  Administered 2019-04-21: 1362.5 ug via INTRAVENOUS

## 2019-04-21 MED ORDER — TICAGRELOR 90 MG PO TABS
ORAL_TABLET | ORAL | Status: AC
  Filled 2019-04-21: qty 2

## 2019-04-21 MED ORDER — MORPHINE SULFATE (PF) 2 MG/ML IV SOLN
2.0000 mg | Freq: Once | INTRAVENOUS | Status: AC
  Administered 2019-04-21: 05:00:00 2 mg via INTRAVENOUS
  Filled 2019-04-21: qty 1

## 2019-04-21 MED ORDER — MORPHINE SULFATE (PF) 2 MG/ML IV SOLN
2.0000 mg | INTRAVENOUS | Status: AC
  Administered 2019-04-21: 2 mg via INTRAVENOUS
  Filled 2019-04-21: qty 1

## 2019-04-21 MED ORDER — HEPARIN SODIUM (PORCINE) 1000 UNIT/ML IJ SOLN
INTRAMUSCULAR | Status: AC
  Filled 2019-04-21: qty 1

## 2019-04-21 MED ORDER — SODIUM CHLORIDE 0.9 % IV SOLN
INTRAVENOUS | Status: AC | PRN
  Administered 2019-04-21: 100 mL/h via INTRAVENOUS

## 2019-04-21 MED ORDER — HEPARIN BOLUS VIA INFUSION
800.0000 [IU] | Freq: Once | INTRAVENOUS | Status: AC
  Administered 2019-04-21: 14:00:00 800 [IU] via INTRAVENOUS
  Filled 2019-04-21: qty 800

## 2019-04-21 MED ORDER — NITROGLYCERIN 1 MG/10 ML FOR IR/CATH LAB
INTRA_ARTERIAL | Status: AC
  Filled 2019-04-21: qty 10

## 2019-04-21 MED ORDER — SODIUM CHLORIDE 0.9 % WEIGHT BASED INFUSION: 3.0000 mL/kg/h | INTRAVENOUS | Status: DC

## 2019-04-21 MED ORDER — VERAPAMIL HCL 2.5 MG/ML IV SOLN
INTRAVENOUS | Status: AC
  Filled 2019-04-21: qty 2

## 2019-04-21 MED ORDER — HYDRALAZINE HCL 20 MG/ML IJ SOLN: 10.0000 mg | INTRAMUSCULAR | Status: AC | PRN

## 2019-04-21 MED ORDER — IOHEXOL 350 MG/ML SOLN
INTRAVENOUS | Status: DC | PRN
  Administered 2019-04-21: 16:00:00 175 mL via INTRA_ARTERIAL

## 2019-04-21 MED ORDER — LABETALOL HCL 5 MG/ML IV SOLN: 10.0000 mg | INTRAVENOUS | Status: AC | PRN

## 2019-04-21 MED ORDER — INFLUENZA VAC SPLIT QUAD 0.5 ML IM SUSY
0.5000 mL | PREFILLED_SYRINGE | INTRAMUSCULAR | Status: AC
  Administered 2019-04-22: 10:00:00 0.5 mL via INTRAMUSCULAR
  Filled 2019-04-21: qty 0.5

## 2019-04-21 MED ORDER — MORPHINE SULFATE (PF) 2 MG/ML IV SOLN
2.0000 mg | Freq: Once | INTRAVENOUS | Status: AC
  Administered 2019-04-21: 2 mg via INTRAVENOUS
  Filled 2019-04-21: qty 1

## 2019-04-21 MED ORDER — MIDAZOLAM HCL 2 MG/2ML IJ SOLN
INTRAMUSCULAR | Status: DC | PRN
  Administered 2019-04-21 (×3): 1 mg via INTRAVENOUS

## 2019-04-21 MED ORDER — LIDOCAINE HCL (PF) 1 % IJ SOLN
INTRAMUSCULAR | Status: AC
  Filled 2019-04-21: qty 30

## 2019-04-21 MED ORDER — ASPIRIN 81 MG PO CHEW: 81.0000 mg | CHEWABLE_TABLET | ORAL | Status: DC

## 2019-04-21 MED ORDER — HEPARIN SODIUM (PORCINE) 5000 UNIT/ML IJ SOLN
5000.0000 [IU] | Freq: Three times a day (TID) | INTRAMUSCULAR | Status: DC
  Administered 2019-04-21 – 2019-04-22 (×2): 5000 [IU] via SUBCUTANEOUS
  Filled 2019-04-21 (×2): qty 1

## 2019-04-21 MED ORDER — LIDOCAINE HCL (PF) 1 % IJ SOLN
INTRAMUSCULAR | Status: DC | PRN
  Administered 2019-04-21: 2 mL

## 2019-04-21 MED ORDER — ASPIRIN EC 81 MG PO TBEC
81.0000 mg | DELAYED_RELEASE_TABLET | Freq: Every day | ORAL | Status: DC
  Administered 2019-04-22: 10:00:00 81 mg via ORAL
  Filled 2019-04-21: qty 1

## 2019-04-21 SURGICAL SUPPLY — 21 items
BALLN SAPPHIRE 2.5X12 (BALLOONS) ×2
BALLN SAPPHIRE ~~LOC~~ 3.5X18 (BALLOONS) ×1 IMPLANT
BALLN SAPPHIRE ~~LOC~~ 3.75X18 (BALLOONS) ×1 IMPLANT
BALLN WOLVERINE 3.00X10 (BALLOONS) ×2
BALLOON SAPPHIRE 2.5X12 (BALLOONS) ×1 IMPLANT
BALLOON WOLVERINE 3.00X10 (BALLOONS) IMPLANT
CATH 5FR JL3.5 JR4 ANG PIG MP (CATHETERS) ×2 IMPLANT
CATH LAUNCHER 6FR JR4 (CATHETERS) ×1 IMPLANT
DEVICE RAD TR BAND REGULAR (VASCULAR PRODUCTS) ×1 IMPLANT
ELECT DEFIB PAD ADLT CADENCE (PAD) ×2 IMPLANT
GLIDESHEATH SLEND A-KIT 6F 22G (SHEATH) ×1 IMPLANT
GUIDEWIRE INQWIRE 1.5J.035X260 (WIRE) IMPLANT
INQWIRE 1.5J .035X260CM (WIRE) ×2
KIT ENCORE 26 ADVANTAGE (KITS) ×2 IMPLANT
KIT HEART LEFT (KITS) ×2 IMPLANT
PACK CARDIAC CATHETERIZATION (CUSTOM PROCEDURE TRAY) ×2 IMPLANT
STENT SYNERGY DES 3.5X20 (Permanent Stent) ×1 IMPLANT
STENT SYNERGY DES 3X38 (Permanent Stent) ×1 IMPLANT
TRANSDUCER W/STOPCOCK (MISCELLANEOUS) ×2 IMPLANT
TUBING CIL FLEX 10 FLL-RA (TUBING) ×2 IMPLANT
WIRE ASAHI PROWATER 180CM (WIRE) ×1 IMPLANT

## 2019-04-21 NOTE — Progress Notes (Signed)
   04/21/19 0456  Vitals  BP (!) 154/105  MAP (mmHg) 120  BP Method Automatic  Pulse Rate 65  Pulse Rate Source Monitor  Oxygen Therapy  SpO2 100 %  MEWS Score  MEWS RR 0  MEWS Pulse 0  MEWS Systolic 0  MEWS LOC 0  MEWS Temp 0  MEWS Score 0  MEWS Score Color Green  Pt still complaining of 10/10 chest pain, nitroglycerin drip initiated at 54mcg/min. Will continue to monitor.

## 2019-04-21 NOTE — Progress Notes (Signed)
Progress Note  Patient Name: Tara Bright Date of Encounter: 04/21/2019  Primary Cardiologist: Kate Sable, MD     Patient Profile:   Tara Bright is a 53 y.o. female with no known hx of CAD but hx  of HTN, TIA, PAD, former tobacco abuse, AAA, who presented to the [facility] ED with chest pain concerning for NSTEMI. hs Tn 614/743  ECG >> PVCs and minor ST changes  Echo 6/20 >> EF normal Rx heparin NTG Anticipate Cath    Subjective  Recurrent chest pain, now improved B >> arms and mid sternal  Inpatient Medications    Scheduled Meds: . aspirin EC  81 mg Oral Daily  . atorvastatin  40 mg Oral Daily  . influenza vac split quadrivalent PF  0.5 mL Intramuscular Tomorrow-1000  . lisinopril  10 mg Oral Daily  . metoprolol tartrate  25 mg Oral BID   Continuous Infusions: . heparin 800 Units/hr (04/21/19 0427)  . nitroGLYCERIN 10 mcg/min (04/21/19 0545)   PRN Meds: acetaminophen, ALPRAZolam, naphazoline-pheniramine, nitroGLYCERIN, ondansetron (ZOFRAN) IV   Vital Signs    Vitals:   04/21/19 0615 04/21/19 0630 04/21/19 0645 04/21/19 0700  BP: (!) 117/92 123/87 122/89 124/86  Pulse: 69 72 69 66  Resp:      Temp:      TempSrc:      SpO2:      Weight:    54.5 kg  Height:        Intake/Output Summary (Last 24 hours) at 04/21/2019 0753 Last data filed at 04/21/2019 0700 Gross per 24 hour  Intake -  Output 900 ml  Net -900 ml   Last 3 Weights 04/21/2019 04/20/2019 04/20/2019  Weight (lbs) 120 lb 3.2 oz 118 lb 1.6 oz 120 lb  Weight (kg) 54.522 kg 53.57 kg 54.432 kg      Telemetry    Sinus - Personally Reviewed  ECG    3/20>> minor ST changes - Personally Reviewed 9/26 >> no changes  9/27>> no changes 09/27 0800  Minor ST elevation replacing ST depression in 3,F  Physical Exam    GEN: No acute distress.   Neck: No JVD Cardiac: RRR, no murmurs, rubs, or gallops.  Respiratory: Clear to auscultation bilaterally. GI: Soft, nontender, non-distended  MS: No edema;  No deformity. Neuro:  Nonfocal  Psych: Normal affect   Labs    High Sensitivity Troponin:   Recent Labs  Lab 04/20/19 1222 04/20/19 1432 04/20/19 1914 04/20/19 2221  TROPONINIHS 614* 743* 866* 817*      Chemistry Recent Labs  Lab 04/20/19 1222 04/21/19 0338  NA 136 137  K 3.4* 4.3  CL 102 105  CO2 22 23  GLUCOSE 107* 99  BUN 13 11  CREATININE 1.03* 0.82  CALCIUM 10.1 9.3  PROT 9.0*  --   ALBUMIN 5.1*  --   AST 25  --   ALT 19  --   ALKPHOS 87  --   BILITOT 0.5  --   GFRNONAA >60 >60  GFRAA >60 >60  ANIONGAP 12 9     Hematology Recent Labs  Lab 04/20/19 1222 04/21/19 0338  WBC 9.5 8.5  RBC 4.83 4.18  HGB 16.2* 14.2  HCT 48.5* 41.2  MCV 100.4* 98.6  MCH 33.5 34.0  MCHC 33.4 34.5  RDW 12.7 12.7  PLT 257 250    BNP Recent Labs  Lab 04/20/19 1914  BNP 65.4     DDimer No results for input(s): DDIMER in the last 168  hours.   Radiology    Dg Chest 2 View  Result Date: 04/20/2019 CLINICAL DATA:  Chest pain EXAM: CHEST - 2 VIEW COMPARISON:  None. FINDINGS: The heart size and mediastinal contours are within normal limits. Both lungs are clear. The visualized skeletal structures are unremarkable. IMPRESSION: No acute abnormality of the lungs. Electronically Signed   By: Eddie Candle M.D.   On: 04/20/2019 13:33    Cardiac Studies        Assessment & Plan    NSTEMI  Recurrent chest pain   AAA   Recurring chest pain, now quiescent again, but ECG showed new but only minor STE  0.5 mm, replacing minor ST depression Will review with HS Plan for now cath tomorrow Continue Hep, NTG Received MS   For questions or updates, please contact [facility] Please consult [email or web address] for contact info under        Signed, Virl Axe, MD  04/21/2019, 7:53 AM

## 2019-04-21 NOTE — Progress Notes (Signed)
At shift change patient stated having 6/10 chest pain, bilateral arm pain and shortness of breath. Patient placed on 2L of oxygen and EKG done- unremarkable. Soon after patient stated chest pain had resolved but still had bilateral arm pain. Paged Dr Tamala Julian, got orders for PRN iv morphine. Will continue to monitor patient closely.

## 2019-04-21 NOTE — Progress Notes (Signed)
Pt c/o chest pain, upper abd pressure to arms/shoulders, she had finished breakfast, only at banana and juice and then felt nausea, ntg qtt increased, paged [facility] NP to advise of pain, EKG completed, order for stat troponin, NP and Dr Caryl Comes at bedside, pt still s light pain in upper chest /arms, order for morphine x1 iv given with resolution of pain, spoke to son Lennette Bihari via phone per pt request, update given and all questions answered

## 2019-04-21 NOTE — Progress Notes (Signed)
[place] for Heparin Indication: chest pain/ACS  Allergies  Allergen Reactions  . Other Nausea And Vomiting and Other (See Comments)    Just about all pain medications cause Nausea and Vomiting. Must take Phenergan beforehand  . Codeine Nausea Only and Other (See Comments)    Gi upset  . Penicillins Hives and Nausea And Vomiting    Did it involve swelling of the face/tongue/throat, SOB, or low BP? No Did it involve sudden or severe rash/hives, skin peeling, or any reaction on the inside of your mouth or nose? Yes Did you need to seek medical attention at a hospital or doctor's office? Yes When did it last happen?Childhood If all above answers are "NO", may proceed with cephalosporin use.     Patient Measurements: Height: 5\' 3"  (160 cm) Weight: 120 lb 3.2 oz (54.5 kg) IBW/kg (Calculated) : 52.4 Heparin Dosing Weight: 53kg  Vital Signs: Temp: 97.7 F (36.5 C) (09/27 0800) Temp Source: Oral (09/27 0800) BP: 124/89 (09/27 0856) Pulse Rate: 83 (09/27 0856)  Labs: Recent Labs    04/20/19 1222 04/20/19 1432 04/20/19 1914 04/20/19 2221 04/21/19 0338  HGB 16.2*  --   --   --  14.2  HCT 48.5*  --   --   --  41.2  PLT 257  --   --   --  250  HEPARINUNFRC  --   --   --   --  0.13*  CREATININE 1.03*  --   --   --  0.82  TROPONINIHS 614* 743* 866* 817*  --     Estimated Creatinine Clearance: 65.6 mL/min (by C-G formula based on SCr of 0.82 mg/dL).   Medical History: Past Medical History:  Diagnosis Date  . Abdominal aortic aneurysm (AAA) ([place])   . Abdominal pain   . Abnormal weight loss   . Ataxia 2016   and visual changes, left AMA before workup completed  . CAD (coronary atherosclerotic disease)   . Hypertension   . Kidney stone   . Loss of appetite   . Nicotine dependence   . Right upper quadrant pain   . UTI (urinary tract infection)      Assessment: 69 yoF admitted with CP and elevated troponins concerning for  NSTEMI. PMH of TIA, PAD, HTN, and AAA with no CT imaging if dissection present. Pharmacy asked to begin IV heparin. No AC noted PTA. Cath possibly planned for tomorrow   Heparin level today is subtherapeutic at 0.21 on 800 units/hour (dose increased last night d/t subtherapeutic level). H&H is slightly lower but wnl at 14.2/41.2, plts wnl.    Goal of Therapy:  Heparin level 0.3-0.7 units/ml Monitor platelets by anticoagulation protocol: Yes   Plan:  - bolus heparin 800 units  -Inc heparin to 900 units/hour  -Check 6 heparin level -Monitor heparin level, CBC, S/Sx bleeding daily    Thank you,   Eddie Candle, PharmD PGY-1 Pharmacy Resident   Please check amion for clinical pharmacist contact number

## 2019-04-21 NOTE — Progress Notes (Signed)
[place] for Heparin Indication: chest pain/ACS  Allergies  Allergen Reactions  . Other Nausea And Vomiting and Other (See Comments)    Just about all pain medications cause Nausea and Vomiting. Must take Phenergan beforehand  . Codeine Nausea Only and Other (See Comments)    Gi upset  . Penicillins Hives and Nausea And Vomiting    Did it involve swelling of the face/tongue/throat, SOB, or low BP? No Did it involve sudden or severe rash/hives, skin peeling, or any reaction on the inside of your mouth or nose? Yes Did you need to seek medical attention at a hospital or doctor's office? Yes When did it last happen?Childhood If all above answers are "NO", may proceed with cephalosporin use.     Patient Measurements: Height: 5\' 3"  (160 cm) Weight: 120 lb 3.2 oz (54.5 kg) IBW/kg (Calculated) : 52.4 Heparin Dosing Weight: 53kg  Vital Signs: Temp: 97.6 F (36.4 C) (09/27 1203) Temp Source: Oral (09/27 1203) BP: 114/83 (09/27 1736) Pulse Rate: 73 (09/27 1736)  Labs: Recent Labs    04/20/19 1222  04/20/19 1914 04/20/19 2221 04/21/19 0338 04/21/19 0837 04/21/19 1219  HGB 16.2*  --   --   --  14.2  --   --   HCT 48.5*  --   --   --  41.2  --   --   PLT 257  --   --   --  250  --   --   HEPARINUNFRC  --   --   --   --  0.13*  --  0.21*  CREATININE 1.03*  --   --   --  0.82  --   --   TROPONINIHS 614*   < > 866* 817*  --  387*  --    < > = values in this interval not displayed.    Estimated Creatinine Clearance: 65.6 mL/min (by C-G formula based on SCr of 0.82 mg/dL).   Medical History: Past Medical History:  Diagnosis Date  . Abdominal aortic aneurysm (AAA) ([place])   . Abdominal pain   . Abnormal weight loss   . Ataxia 2016   and visual changes, left AMA before workup completed  . CAD (coronary atherosclerotic disease)   . Hypertension   . Kidney stone   . Loss of appetite   . Nicotine dependence   . Right upper quadrant  pain   . UTI (urinary tract infection)      Assessment: 28 yoF admitted with CP and elevated troponins concerning for NSTEMI. PMH of TIA, PAD, HTN, and AAA with no CT imaging if dissection present. Pharmacy asked to begin IV heparin. No AC noted PTA. Cath possibly planned for tomorrow   Pt now s/p urgent cath, heparin off, pharmacy asked to order tirofiban x2hr.   Goal of Therapy:  Heparin level 0.3-0.7 units/ml Monitor platelets by anticoagulation protocol: Yes   Plan:  -Stop IV heparin -Tirofiban 0.15 mcg/kg/min x2h   Arrie Senate, PharmD, BCPS Clinical Pharmacist [phone] Please check AMION for all [facility] Pharmacy numbers 04/21/2019

## 2019-04-21 NOTE — Progress Notes (Signed)
   04/21/19 0418  Vitals  BP (!) 179/117  MAP (mmHg) 136  BP Method Automatic  Pulse Rate 95  Pulse Rate Source Monitor  Oxygen Therapy  SpO2 100 %  MEWS Score  MEWS RR 0  MEWS Pulse 0  MEWS Systolic 0  MEWS LOC 0  MEWS Temp 0  MEWS Score 0  MEWS Score Color Green  Patient c/o 10/10 left sharp and stabbing chest pain that radiates to the left arm and back, pt is restless and anxious  blood pressure is elevated, EKG done, SL nitroglycerin x 2 given, pt still complaining 10/10 chest pain, Dr. Rhae Hammock, Lovena Le  Notified.  2mg  IV morphine given per MD order. Will continue to monitor.

## 2019-04-21 NOTE — CV Procedure (Signed)
   Urgent coronary angiography with left ventriculography and placement of 2 drug-eluting stents in the right coronary via right radial approach.  Real-time vascular ultrasound used for guidance.  Right coronary is subtotally occluded in the mid vessel with reduced TIMI flow.  The right coronary contains extensive disease including 60 to 70% ostial plot proximal stenosis, 99% mid stenosis, and 80% segmental distal stenosis.  A 38 x 3.0 mm Synergy was placed to cover the distal and mid vessel stenosis.  The stent was postdilated with 3.75 x 18 mm balloon in an overlapping fashion.  A second Synergy stent was placed from the ostium to the proximal vessel after using a 3.0 Wolverine to dilate the ostium.  A 3.5 x 20 mm Synergy was positioned and deployed.  Subsequent postdilatation using a 3.75 x 18 throughout the stented segment to 14 atm.  Final result reveals less than 20% stenosis within the stented segments, and TIMI grade III flow.  Moderate disease is noted in the mid circumflex.  Mild to moderate disease in the LAD.  Inferior wall is hypokinetic.  EF estimated to be 55%.  LVEDP is 10 mmHg.

## 2019-04-21 NOTE — Progress Notes (Signed)
Spoke w Dr Sheldon Silvan who reviewed ECGs   Will continue med Rx for now

## 2019-04-21 NOTE — Progress Notes (Signed)
Pt taken to cath lab via bed, NTG and Heparin qtt turned off per Dr Tamala Julian, pt sr on telemetry, no c/o chest pain when taken to cath lab, son JT in room awaiting pt to have cath , CCMD called to notify of pt to cath lab

## 2019-04-21 NOTE — Progress Notes (Signed)
CTSP for recurrent chest heaviness with radiation into jaw  No diaphoresis  BP >> 75 NTG down titrated, BP now 93 with IV fluids running  Mild distress  RRR Clear  ECG unchanged from earlier this am with minimal STE 3.F  Have reviewed with Dr [facility] and will proceed with cath at this time urgently given + Tn and recurring chest pain and dynamic ECG changes

## 2019-04-21 NOTE — Progress Notes (Signed)
[place] for Heparin Indication: chest pain/ACS  Allergies  Allergen Reactions  . Other Nausea And Vomiting and Other (See Comments)    Just about all pain medications cause Nausea and Vomiting. Must take Phenergan beforehand  . Codeine Nausea Only and Other (See Comments)    Gi upset  . Penicillins Hives and Nausea And Vomiting    Did it involve swelling of the face/tongue/throat, SOB, or low BP? No Did it involve sudden or severe rash/hives, skin peeling, or any reaction on the inside of your mouth or nose? Yes Did you need to seek medical attention at a hospital or doctor's office? Yes When did it last happen?Childhood If all above answers are "NO", may proceed with cephalosporin use.     Patient Measurements: Height: 5\' 3"  (160 cm) Weight: 118 lb 1.6 oz (53.6 kg) IBW/kg (Calculated) : 52.4 Heparin Dosing Weight: 53kg  Vital Signs: Temp: 98.4 F (36.9 C) (09/26 2040) Temp Source: Oral (09/26 2040) BP: 179/117 (09/27 0418) Pulse Rate: 95 (09/27 0418)  Labs: Recent Labs    04/20/19 1222 04/20/19 1432 04/20/19 1914 04/20/19 2221 04/21/19 0338  HGB 16.2*  --   --   --  14.2  HCT 48.5*  --   --   --  41.2  PLT 257  --   --   --  250  HEPARINUNFRC  --   --   --   --  0.13*  CREATININE 1.03*  --   --   --  0.82  TROPONINIHS 614* 743* 866* 817*  --     Estimated Creatinine Clearance: 65.6 mL/min (by C-G formula based on SCr of 0.82 mg/dL).   Medical History: Past Medical History:  Diagnosis Date  . Abdominal aortic aneurysm (AAA) ([place])   . Abdominal pain   . Abnormal weight loss   . Ataxia 2016   and visual changes, left AMA before workup completed  . CAD (coronary atherosclerotic disease)   . Hypertension   . Kidney stone   . Loss of appetite   . Nicotine dependence   . Right upper quadrant pain   . UTI (urinary tract infection)      Assessment: 4 yoF admitted with CP and slightly elevated troponins. Pharmacy  asked to begin IV heparin. No AC noted PTA, H/H wnl.  9/27 AM update: Heparin level low No issues per RN  Goal of Therapy:  Heparin level 0.3-0.7 units/ml Monitor platelets by anticoagulation protocol: Yes   Plan:  -Heparin 1500 units bolus x 1  -Inc heparin to 800 units/hr -Check 8-hr heparin level -Monitor heparin level, CBC, S/Sx bleeding daily  Narda Bonds, PharmD, BCPS Clinical Pharmacist Phone: [phone]

## 2019-04-21 NOTE — Progress Notes (Signed)
Pt in 6e06, pt given purse , cell phone and denture cup with teeth delivered to pt, atttempted to call son on number she gave but either was not correct

## 2019-04-21 NOTE — Progress Notes (Signed)
   04/21/19 0551  Pain Assessment  Pain Scale 0-10  Pain Score 6  Pain Type Acute pain  Pain Location Chest  Pain Orientation Left  Pain Descriptors / Indicators Stabbing  Pain Frequency Constant  Pain Onset On-going  Patients Stated Pain Goal 0  Pain Intervention(s) Medication (See eMAR)  Pt given 2mg  morphine IV per MD order, nitroglycerin IV increased to 79mcg/min. Will continue to monitor.

## 2019-04-21 NOTE — Interval H&P Note (Signed)
Cath Lab Visit (complete for each Cath Lab visit)  Clinical Evaluation Leading to the Procedure:   ACS: Yes.    Non-ACS:    Anginal Classification: CCS III  Anti-ischemic medical therapy: Minimal Therapy (1 class of medications)  Non-Invasive Test Results: No non-invasive testing performed  Prior CABG: No previous CABG      History and Physical Interval Note:  04/21/2019 2:29 PM  Tara Bright  has presented today for surgery, with the diagnosis of Unstable Angina.  The various methods of treatment have been discussed with the patient and family. After consideration of risks, benefits and other options for treatment, the patient has consented to  Procedure(s): LEFT HEART CATH AND CORONARY ANGIOGRAPHY (N/A) as a surgical intervention.  The patient's history has been reviewed, patient examined, no change in status, stable for surgery.  I have reviewed the patient's chart and labs.  Questions were answered to the patient's satisfaction.     Belva Crome III

## 2019-04-21 NOTE — Progress Notes (Signed)
Pt called my phone to report she was getting having episode, pt c/o pressure lower chest, shoulder down arms and stomach "4" bp low ntg adjusted, paged Rosita Fire NP who will have Dr Caryl Comes come see pt, EKG with no changes, Dr Caryl Comes to bedside NS bolus infusing, pt informed to cath lab today, educated on NPO now, pt no distress, pain persistent at 4, she calle son Lennette Bihari and update,  Pt says vague pain, ntg at 21mcg.

## 2019-04-21 NOTE — H&P (View-Only) (Signed)
CTSP for recurrent chest heaviness with radiation into jaw  No diaphoresis  BP >> 75 NTG down titrated, BP now 93 with IV fluids running  Mild distress  RRR Clear  ECG unchanged from earlier this am with minimal STE 3.F  Have reviewed with Dr [facility] and will proceed with cath at this time urgently given + Tn and recurring chest pain and dynamic ECG changes

## 2019-04-22 ENCOUNTER — Encounter (HOSPITAL_COMMUNITY): Payer: Self-pay | Admitting: Interventional Cardiology

## 2019-04-22 ENCOUNTER — Telehealth: Payer: Self-pay

## 2019-04-22 ENCOUNTER — Inpatient Hospital Stay (HOSPITAL_COMMUNITY): Payer: Self-pay

## 2019-04-22 DIAGNOSIS — R079 Chest pain, unspecified: Secondary | ICD-10-CM

## 2019-04-22 LAB — BASIC METABOLIC PANEL
Anion gap: 11 (ref 5–15)
BUN: 8 mg/dL (ref 6–20)
CO2: 20 mmol/L — ABNORMAL LOW (ref 22–32)
Calcium: 9.4 mg/dL (ref 8.9–10.3)
Chloride: 106 mmol/L (ref 98–111)
Creatinine, Ser: 0.76 mg/dL (ref 0.44–1.00)
GFR calc Af Amer: >60 mL/min (ref >60)
GFR calc non Af Amer: >60 mL/min (ref >60)
Glucose, Bld: 103 mg/dL — ABNORMAL HIGH (ref 70–99)
Potassium: 3.5 mmol/L (ref 3.5–5.1)
Sodium: 137 mmol/L (ref 135–145)

## 2019-04-22 LAB — LIPOPROTEIN A (LPA): Lipoprotein (a): 26.1 nmol/L (ref <75.0)

## 2019-04-22 LAB — CBC
HCT: 37.5 % (ref 36.0–46.0)
Hemoglobin: 13.4 g/dL (ref 12.0–15.0)
MCH: 34.8 pg — ABNORMAL HIGH (ref 26.0–34.0)
MCHC: 35.7 g/dL (ref 30.0–36.0)
MCV: 97.4 fL (ref 80.0–100.0)
Platelets: 219 10*3/uL (ref 150–400)
RBC: 3.85 MIL/uL — ABNORMAL LOW (ref 3.87–5.11)
RDW: 12.6 % (ref 11.5–15.5)
WBC: 9.8 10*3/uL (ref 4.0–10.5)
nRBC: 0 % (ref 0.0–0.2)

## 2019-04-22 LAB — URINE CULTURE

## 2019-04-22 LAB — ECHOCARDIOGRAM COMPLETE
Height: 63 in
Weight: 1942.4 oz

## 2019-04-22 LAB — HIGH SENSITIVITY CRP: CRP, High Sensitivity: 1.92 mg/L (ref 0.00–3.00)

## 2019-04-22 MED ORDER — NITROGLYCERIN 0.4 MG SL SUBL: 0.4000 mg | SUBLINGUAL_TABLET | SUBLINGUAL | 2 refills | Status: AC | PRN

## 2019-04-22 MED ORDER — ATORVASTATIN CALCIUM 80 MG PO TABS: 80.0000 mg | ORAL_TABLET | Freq: Every day | ORAL | 1 refills | Status: DC

## 2019-04-22 MED ORDER — TICAGRELOR 90 MG PO TABS: 90.0000 mg | ORAL_TABLET | Freq: Two times a day (BID) | ORAL | 2 refills | Status: DC

## 2019-04-22 MED ORDER — METOPROLOL TARTRATE 25 MG PO TABS: 25.0000 mg | ORAL_TABLET | Freq: Two times a day (BID) | ORAL | 0 refills | Status: DC

## 2019-04-22 MED FILL — METOPROLOL TARTRATE 25 MG T: 25 | 30 days supply | Qty: 60 | Fill #0

## 2019-04-22 MED FILL — NITROGLYCERIN 0.4 MG TAB SL: 0.4 | 8 days supply | Qty: 25 | Fill #0

## 2019-04-22 MED FILL — BRILINTA 90 MG TABLET: 90 | 30 days supply | Qty: 60 | Fill #0

## 2019-04-22 MED FILL — ATORVASTATIN CALCIUM 80 MG: 80 | 30 days supply | Qty: 30 | Fill #0

## 2019-04-22 NOTE — Telephone Encounter (Signed)
Called pt. No answer. Left message for pt to return call.  

## 2019-04-22 NOTE — TOC Initial Note (Signed)
Transition of Care [facility]) - Initial/Assessment Note    Patient Details  Name: Tara Bright MRN: [id number] Date of Birth: 1966-07-03  [facility] CM/SW Contact:    Bethena Roys, RN Phone Number: 04/22/2019, 10:17 AM  Clinical Narrative: Pt presented for Chest Pain-NStemi- plan for home on Brilinta. TOC pharmacy to deliver medications to the room at time of transition home. CM has PCP Dr. Gar Ponto @ [facility]- patient uses a sliding scale payment oat office. Patient uses [facility] in [place] and pays for medications out of pocket. Patient states she has not worked since Jan. Patient has transportation home via her son once stable. Patient is aware that she will get a 30 day free Brilinta card via the [employer] and patient is aware to utilize online [employer] for the Brilinta patient assistance application as soon as possible. No further needs from CM at this time.    Expected Discharge Plan: Home/Self Care Barriers to Discharge: No Barriers Identified   Patient Goals and CMS Choice Patient states their goals for this hospitalization and ongoing recovery are:: "to return home"   Choice offered to / list presented to : NA  Expected Discharge Plan and Services Expected Discharge Plan: Home/Self Care In-house Referral: NA Discharge Planning Services: CM Consult Post Acute Care Choice: NA Living arrangements for the past 2 months: Single Family Home                  Prior Living Arrangements/Services Living arrangements for the past 2 months: Single Family Home Lives with:: Spouse Patient language and need for interpreter reviewed:: Yes Do you feel safe going back to the place where you live?: Yes      Need for Family Participation in Patient Care: Yes (Comment) Care giver support system in place?: Yes (comment) Current home services: (none) Criminal Activity/Legal Involvement Pertinent to Current Situation/Hospitalization: No - Comment as needed  Activities  of Daily Living Home Assistive Devices/Equipment: None ADL Screening (condition at time of admission) Patient's cognitive ability adequate to safely complete daily activities?: Yes Is the patient deaf or have difficulty hearing?: No Does the patient have difficulty seeing, even when wearing glasses/contacts?: No Does the patient have difficulty concentrating, remembering, or making decisions?: No Patient able to express need for assistance with ADLs?: No Does the patient have difficulty dressing or bathing?: No Independently performs ADLs?: Yes (appropriate for developmental age) Does the patient have difficulty walking or climbing stairs?: No Weakness of Legs: None Weakness of Arms/Hands: None  Permission Sought/Granted Permission sought to share information with : Family Supports Permission granted to share information with : No              Emotional Assessment Appearance:: Appears stated age Attitude/Demeanor/Rapport: Engaged Affect (typically observed): Accepting Orientation: : Oriented to Situation, Oriented to  Time, Oriented to Place, Oriented to Self Alcohol / Substance Use: Not Applicable Psych Involvement: No (comment)  Admission diagnosis:  Elevated troponin [R79.89] Patient Active Problem List   Diagnosis Date Noted  . NSTEMI (non-ST elevated myocardial infarction) ([place]) 04/20/2019  . Chronic cholecystitis 09/06/2018  . PAD (peripheral artery disease) ([place]) 09/06/2018  . AAA (abdominal aortic aneurysm) without rupture ([place]) 09/06/2018  . Bruit of left carotid artery 09/06/2018  . Asymmetric blood pressures 09/06/2018  . UTI (urinary tract infection) 04/05/2017  . Hyponatremia 04/05/2017  . Kidney stone 04/05/2017  . Hydronephrosis 04/05/2017  . Essential hypertension 04/05/2017  . Elevated blood pressure reading 04/05/2017   PCP:  Quillian Quince,  Mitzie Na, MD Pharmacy:   [id number] [place], [place] [address] [place] [address] Phone: [phone] Fax: [phone]     Social Determinants of Health (SDOH) Interventions    Readmission Risk Interventions No flowsheet data found.

## 2019-04-22 NOTE — Progress Notes (Signed)
  Echocardiogram 2D Echocardiogram has been performed.  Tara Bright 04/22/2019, 8:44 AM

## 2019-04-22 NOTE — Discharge Summary (Signed)
Discharge Summary    Patient ID: Tara Bright,  MRN: [id number], DOB/AGE: Nov 15, 1965 53 y.o.  Admit date: 04/20/2019 Discharge date: 04/22/2019  Primary Care Provider: Caryl Bright Primary Cardiologist: Tara Sable, MD  Discharge Diagnoses    Principal Problem:   NSTEMI (non-ST elevated myocardial infarction) [facility] Active Problems:   Essential hypertension   PAD (peripheral artery disease) ([place])   AAA (abdominal aortic aneurysm) without rupture ([place])   Bruit of left carotid artery   Allergies Allergies  Allergen Reactions  . Other Nausea And Vomiting and Other (See Comments)    Just about all pain medications cause Nausea and Vomiting. Must take Phenergan beforehand  . Codeine Nausea Only and Other (See Comments)    Gi upset  . Penicillins Hives and Nausea And Vomiting    Did it involve swelling of the face/tongue/throat, SOB, or low BP? No Did it involve sudden or severe rash/hives, skin peeling, or any reaction on the inside of your mouth or nose? Yes Did you need to seek medical attention at a hospital or doctor's office? Yes When did it last happen?Childhood If all above answers are "NO", may proceed with cephalosporin use.     Diagnostic Studies/Procedures    Cath: 04/21/19   Non-ST elevation myocardial infarction due to 99% mid RCA stenosis with TIMI flow II beyond the stenosis. The lesion appears to be thrombotic.  Successful ostial to proximal and mid to distal RCA stenting was performed, reducing all areas of obstruction to less than 20%. Final postdilatation balloon diameter and all segments stented with 3.75 mm (a 3.5 x 20 Synergy stent was placed proximally and a 38 x 3.0 stent placed in the mid to distal). TIMI flow postintervention was normal.  Normal left main  30% mid LAD  60 to 70% mid circumflex, 60 to 70% second obtuse marginal, and 75% distal circumflex.  Low normal LV systolic function with EF 50% and inferobasal hypokinesis.  LVEDP is normal.  RECOMMENDATIONS:   IV nitroglycerin has been discontinued.  IV fluid at 75 cc/h will be continued until the a.m.  Aggrastat will run for 2 hours and then be discontinued.  Aspirin and Brilinta should be continued for 12 months and then consider switching to clopidogrel monotherapy.  Aggressive risk factor modification with LDL cholesterol less than 55. We should also check an LP(a), hs-CRP.  Consider VASCEPA.  Diagnostic Dominance: Right  Intervention    TTE: 04/22/19  IMPRESSIONS    1. Left ventricular ejection fraction, by visual estimation, is 65 to 70%. The left ventricle has normal function. Normal left ventricular size. There is no left ventricular hypertrophy.  2. Global right ventricle has normal systolic function.The right ventricular size is normal. No increase in right ventricular wall thickness.  3. Left atrial size was normal.  4. Right atrial size was normal.  5. The mitral valve is normal in structure. Trace mitral valve regurgitation. No evidence of mitral stenosis.  6. The tricuspid valve is normal in structure. Tricuspid valve regurgitation is trivial.  7. The aortic valve is normal in structure. Aortic valve regurgitation was not visualized by color flow Doppler. Structurally normal aortic valve, with no evidence of sclerosis or stenosis.  8. The pulmonic valve was normal in structure. Pulmonic valve regurgitation is not visualized by color flow Doppler.  9. TR signal is inadequate for assessing pulmonary artery systolic pressure. 10. The inferior vena cava is normal in size with greater than 50% respiratory variability, suggesting right atrial pressure  of 3 mmHg. _____________   History of Present Illness     Tara Bright is a 53 y.o. female with a history of HTN, TIA, PAD, former tobacco abuse, AAA, who [facility] to [facility] with chest pain concerning for NSTEMI.   The patient had no known cardiac disease although she had  risk factors including HTN, prior TIA, PAD, and AAA. She was evaluated by Dr. Bronson Bright in March 2020 for preoperative evaluation prior to cholecystectomy. At that visit she reported chest discomfort that was atypical for angina. Echocardiogram was largely unremarkable, and stress testing was deferred due to normal exercise capacity. ASA and atorvastatin were started.   She presented to the [facility] ED with chest pain and dyspnea. In the ED, ECG showed sinus rhythm rhythm with a PVC and subtle ST depressions in lateral precordial leads. Troponin was 614 with repeat measurement of 743. She was given ASA and NTG and accepted for transfer to [facility].  On arrival to [facility], BP 121/90 with HR 77. ECG was repeated and showed sinus rhythm with PVCs. She denied chest pain or any other symptoms on assessment. She stated that two days prior she began to experience a heaviness in her chest that radiated into her bilateral shoulders and arms. The pain was worse when she laid flat and improved with sitting forward and hugging herself. She denied exertional or pleuritic triggers. She had some associated diaphoresis and dyspnea but denied nausea. She had been chest pain free all day. She stated that she had never had chest pain like this in the past and that these symptoms were different than her prior chest pain.   Hospital Course     1. NSTEMI: HsT peaked at 2061. Underwent urgent cardiac cath 9/27 in the afternoon 2/2 ongoing chest pain and hypotension, cath noted above with PCI/DESx2 to the RCA. Residual disease in the LAD, and Lcx to be treated medically. Placed on DAPT with ASA/Brilinta for at least one year. Worked well with cardiac rehab without recurrent chest pain. Worked well with cardiac rehab. Follow up echo showed normal EF with no rWMA.  -- on ASA, brilinta, statin, BB, ACEi  2. HL: statin increased to atorva 80mg  daily. LDL 71, Trig 290. Could consider Vascepa as an outpatient pending  follow up FLP/LFTs  3. HTN: low dose metoprolol added this admission. Lisinopril 10mg  daily (home med). Blood pressures are borderline soft, but suspect will improve as she is more active.   4. AAA/PAD: followed by [facility], Dr. Donnetta Hutching.   5. Tobacco use: reports she stopped smoking about 6 months prior.   Tara Bright was seen by Dr. Burt Knack and determined stable for discharge home. Follow up in the office has been arranged. Medications are listed below.   _____________  Discharge Vitals Blood pressure 110/74, pulse 67, temperature 98.4 F (36.9 C), temperature source Oral, resp. rate 16, height 5\' 3"  (1.6 m), weight 55.1 kg, last menstrual period 03/08/2017, SpO2 100 %.  Filed Weights   04/20/19 1828 04/21/19 0700 04/22/19 0521  Weight: 53.6 kg 54.5 kg 55.1 kg    Labs & Radiologic Studies    CBC Recent Labs    04/21/19 1736 04/22/19 0946  WBC 9.4 9.8  HGB 13.8 13.4  HCT 40.2 37.5  MCV 98.5 97.4  PLT 248 [id number]   Basic Metabolic Panel Recent Labs    04/21/19 0338 04/21/19 1736 04/22/19 0946  NA 137  --  137  K 4.3  --  3.5  CL 105  --  106  CO2 23  --  20*  GLUCOSE 99  --  103*  BUN 11  --  8  CREATININE 0.82 0.83 0.76  CALCIUM 9.3  --  9.4   Liver Function Tests Recent Labs    04/20/19 1222  AST 25  ALT 19  ALKPHOS 87  BILITOT 0.5  PROT 9.0*  ALBUMIN 5.1*   Recent Labs    04/20/19 1222  LIPASE 30   Cardiac Enzymes No results for input(s): CKTOTAL, CKMB, CKMBINDEX, TROPONINI in the last 72 hours. BNP Invalid input(s): POCBNP D-Dimer No results for input(s): DDIMER in the last 72 hours. Hemoglobin A1C Recent Labs    04/20/19 1914  HGBA1C 5.4   Fasting Lipid Panel Recent Labs    04/21/19 0338  CHOL 156  HDL 27*  LDLCALC 71  TRIG 290*  CHOLHDL 5.8   Thyroid Function Tests No results for input(s): TSH, T4TOTAL, T3FREE, THYROIDAB in the last 72 hours.  Invalid input(s): FREET3 _____________  Dg Chest 2 View  Result Date: 04/20/2019  CLINICAL DATA:  Chest pain EXAM: CHEST - 2 VIEW COMPARISON:  None. FINDINGS: The heart size and mediastinal contours are within normal limits. Both lungs are clear. The visualized skeletal structures are unremarkable. IMPRESSION: No acute abnormality of the lungs. Electronically Signed   By: Eddie Candle M.D.   On: 04/20/2019 13:33   Disposition   Pt is being discharged home today in good condition.  Follow-up Plans & Appointments    Follow-up Information    Imogene Burn, PA-C Follow up on 05/06/2019.   Specialty: Cardiology Why: at 1pm for your follow up appt.  Contact information: [place] [address] [phone]          Discharge Instructions    Amb Referral to Cardiac Rehabilitation   Complete by: As directed    Diagnosis:  Coronary Stents NSTEMI     After initial evaluation and assessments completed: Virtual Based Care may be provided alone or in conjunction with Phase 2 Cardiac Rehab based on patient barriers.: Yes   Call MD for:  redness, tenderness, or signs of infection (pain, swelling, redness, odor or green/yellow discharge around incision site)   Complete by: As directed    Diet - low sodium heart healthy   Complete by: As directed    Discharge instructions   Complete by: As directed    Radial Site Care Refer to this sheet in the next few weeks. These instructions provide you with information on caring for yourself after your procedure. Your caregiver may also give you more specific instructions. Your treatment has been planned according to current medical practices, but problems sometimes occur. Call your caregiver if you have any problems or questions after your procedure. HOME CARE INSTRUCTIONS You may shower the day after the procedure.Remove the bandage (dressing) and gently wash the site with plain soap and water.Gently pat the site dry.  Do not apply powder or lotion to the site.  Do not submerge the affected site in water for 3 to 5  days.  Inspect the site at least twice daily.  Do not flex or bend the affected arm for 24 hours.  No lifting over 5 pounds (2.3 kg) for 5 days after your procedure.  Do not drive home if you are discharged the same day of the procedure. Have someone else drive you.  You may drive 24 hours after the procedure unless otherwise instructed by your  caregiver.  What to expect: Any bruising will usually fade within 1 to 2 weeks.  Blood that collects in the tissue (hematoma) may be painful to the touch. It should usually decrease in size and tenderness within 1 to 2 weeks.  SEEK IMMEDIATE MEDICAL CARE IF: You have unusual pain at the radial site.  You have redness, warmth, swelling, or pain at the radial site.  You have drainage (other than a small amount of blood on the dressing).  You have chills.  You have a fever or persistent symptoms for more than 72 hours.  You have a fever and your symptoms suddenly get worse.  Your arm becomes pale, cool, tingly, or numb.  You have heavy bleeding from the site. Hold pressure on the site.   PLEASE DO NOT MISS ANY DOSES OF YOUR BRILINTA!!!!! Also keep a log of you blood pressures and bring back to your follow up appt. Please call the office with any questions.   Patients taking blood thinners should generally stay away from medicines like ibuprofen, Advil, Motrin, naproxen, and Aleve due to risk of stomach bleeding. You may take Tylenol as directed or talk to your primary doctor about alternatives.   Increase activity slowly   Complete by: As directed        Discharge Medications     Medication List    TAKE these medications   Align 4 MG Caps Take 8 mg by mouth daily before breakfast.   ALPRAZolam 1 MG tablet Commonly known as: XANAX Take 1 mg by mouth at bedtime.   aspirin EC 81 MG tablet Take 1 tablet (81 mg total) by mouth daily.   atorvastatin 80 MG tablet Commonly known as: LIPITOR Take 1 tablet (80 mg total) by mouth daily at 6  PM. What changed:   medication strength  how much to take  when to take this   bisacodyl 5 MG EC tablet Commonly known as: DULCOLAX Take 5 mg by mouth as needed for mild constipation or moderate constipation.   cyclobenzaprine 5 MG tablet Commonly known as: FLEXERIL Take 5 mg by mouth at bedtime as needed for muscle spasms.   docusate sodium 100 MG capsule Commonly known as: COLACE Take 100 mg by mouth daily as needed for mild constipation.   lisinopril 10 MG tablet Commonly known as: ZESTRIL Take 10 mg by mouth daily.   metoprolol tartrate 25 MG tablet Commonly known as: LOPRESSOR Take 1 tablet (25 mg total) by mouth 2 (two) times daily.   nitroGLYCERIN 0.4 MG SL tablet Commonly known as: NITROSTAT Place 1 tablet (0.4 mg total) under the tongue every 5 (five) minutes x 3 doses as needed for chest pain.   ticagrelor 90 MG Tabs tablet Commonly known as: BRILINTA Take 1 tablet (90 mg total) by mouth 2 (two) times daily.   Visine 0.025-0.3 % ophthalmic solution Generic drug: naphazoline-pheniramine Place 1 drop into both eyes 4 (four) times daily as needed for eye irritation.        Acute coronary syndrome (MI, NSTEMI, STEMI, etc) this admission?: Yes.     AHA/ACC Clinical Performance & Quality Measures: 1. Aspirin prescribed? - Yes 2. ADP Receptor Inhibitor (Plavix/Clopidogrel, Brilinta/Ticagrelor or Effient/Prasugrel) prescribed (includes medically managed patients)? - Yes 3. Beta Blocker prescribed? - Yes 4. High Intensity Statin (Lipitor 40-80mg  or Crestor 20-40mg ) prescribed? - Yes 5. EF assessed during THIS hospitalization? - Yes 6. For EF <40%, was ACEI/ARB prescribed? - Not Applicable (EF >/= [id number]) 7. For EF <40%,  Aldosterone Antagonist (Spironolactone or Eplerenone) prescribed? - Not Applicable (EF >/= [id number]) 8. Cardiac Rehab Phase II ordered (Included Medically managed Patients)? - Yes   Outstanding Labs/Studies   FLP/LFTs in 6 weeks.   Duration of  Discharge Encounter   Greater than 30 minutes including physician time.  Signed, Reino Bellis NP-C 04/22/2019, 1:48 PM

## 2019-04-22 NOTE — Progress Notes (Addendum)
Progress Note  Patient Name: Tara Bright Date of Encounter: 04/22/2019  Primary Cardiologist: Kate Sable, MD   Subjective   No chest pain overnight. Walked with CR this morning.   Inpatient Medications    Scheduled Meds: . aspirin EC  81 mg Oral Daily  . atorvastatin  80 mg Oral q1800  . heparin  5,000 Units Subcutaneous Q8H  . influenza vac split quadrivalent PF  0.5 mL Intramuscular Tomorrow-1000  . lisinopril  10 mg Oral Daily  . metoprolol tartrate  25 mg Oral BID  . sodium chloride flush  3 mL Intravenous Q12H  . ticagrelor  90 mg Oral BID   Continuous Infusions: . sodium chloride     PRN Meds: sodium chloride, acetaminophen, ALPRAZolam, morphine injection, naphazoline-pheniramine, nitroGLYCERIN, ondansetron (ZOFRAN) IV, sodium chloride flush   Vital Signs    Vitals:   04/21/19 1807 04/21/19 1837 04/21/19 2000 04/22/19 0521  BP: (!) 114/102 105/81 123/60 124/82  Pulse: 73 68 70 67  Resp:   14 16  Temp:   98.5 F (36.9 C) 98.4 F (36.9 C)  TempSrc:   Oral Oral  SpO2: 100% 100% 100% 100%  Weight:    55.1 kg  Height:        Intake/Output Summary (Last 24 hours) at 04/22/2019 0914 Last data filed at 04/21/2019 2000 Gross per 24 hour  Intake 609.39 ml  Output 600 ml  Net 9.39 ml   Last 3 Weights 04/22/2019 04/21/2019 04/20/2019  Weight (lbs) 121 lb 6.4 oz 120 lb 3.2 oz 118 lb 1.6 oz  Weight (kg) 55.067 kg 54.522 kg 53.57 kg      Telemetry    SR - Personally Reviewed  ECG    SR with TWI in inferolateral leads - Personally Reviewed  Physical Exam  Pleasant younger WF GEN: No acute distress.   Neck: No JVD Cardiac: RRR, no murmurs, rubs, or gallops.  Respiratory: Clear to auscultation bilaterally. GI: Soft, nontender, non-distended  MS: No edema; No deformity. Right radial cath site.  Neuro:  Nonfocal  Psych: Normal affect   Labs    High Sensitivity Troponin:   Recent Labs  Lab 04/20/19 1914 04/20/19 2221 04/21/19 0837 04/21/19  1736 04/21/19 2004  TROPONINIHS 866* 817* 387* 1,280* 2,061*      Chemistry Recent Labs  Lab 04/20/19 1222 04/21/19 0338 04/21/19 1736  NA 136 137  --   K 3.4* 4.3  --   CL 102 105  --   CO2 22 23  --   GLUCOSE 107* 99  --   BUN 13 11  --   CREATININE 1.03* 0.82 0.83  CALCIUM 10.1 9.3  --   PROT 9.0*  --   --   ALBUMIN 5.1*  --   --   AST 25  --   --   ALT 19  --   --   ALKPHOS 87  --   --   BILITOT 0.5  --   --   GFRNONAA >60 >60 >60  GFRAA >60 >60 >60  ANIONGAP 12 9  --      Hematology Recent Labs  Lab 04/20/19 1222 04/21/19 0338 04/21/19 1736  WBC 9.5 8.5 9.4  RBC 4.83 4.18 4.08  HGB 16.2* 14.2 13.8  HCT 48.5* 41.2 40.2  MCV 100.4* 98.6 98.5  MCH 33.5 34.0 33.8  MCHC 33.4 34.5 34.3  RDW 12.7 12.7 12.7  PLT 257 250 248    BNP Recent Labs  Lab 04/20/19 1914  BNP 65.4     DDimer No results for input(s): DDIMER in the last 168 hours.   Radiology    Dg Chest 2 View  Result Date: 04/20/2019 CLINICAL DATA:  Chest pain EXAM: CHEST - 2 VIEW COMPARISON:  None. FINDINGS: The heart size and mediastinal contours are within normal limits. Both lungs are clear. The visualized skeletal structures are unremarkable. IMPRESSION: No acute abnormality of the lungs. Electronically Signed   By: Eddie Candle M.D.   On: 04/20/2019 13:33    Cardiac Studies   Cath: 04/21/19   Non-ST elevation myocardial infarction due to 99% mid RCA stenosis with TIMI flow II beyond the stenosis.  The lesion appears to be thrombotic.  Successful ostial to proximal and mid to distal RCA stenting was performed, reducing all areas of obstruction to less than 20%.  Final postdilatation balloon diameter and all segments stented with 3.75 mm (a 3.5 x 20 Synergy stent was placed proximally and a 38 x 3.0 stent placed in the mid to distal).  TIMI flow postintervention was normal.  Normal left main  30% mid LAD  60 to 70% mid circumflex, 60 to 70% second obtuse marginal, and 75% distal  circumflex.  Low normal LV systolic function with EF 50% and inferobasal hypokinesis.  LVEDP is normal.  RECOMMENDATIONS:   IV nitroglycerin has been discontinued.  IV fluid at 75 cc/h will be continued until the a.m.  Aggrastat will run for 2 hours and then be discontinued.  Aspirin and Brilinta should be continued for 12 months and then consider switching to clopidogrel monotherapy.  Aggressive risk factor modification with LDL cholesterol less than 55.  We should also check an LP(a), hs-CRP.  Consider VASCEPA.  Diagnostic Dominance: Right  Intervention    TTE: pending  Patient Profile     53 y.o. female with a history of HTN, TIA, PAD, former tobacco abuse, AAA, who presented to [facility] with chest pain concerning for NSTEMI.   Assessment & Plan    1. NSTEMI: HsT peaked at 2061. Underwent urgent cardiac cath yesterday afternoon noted above with PCI/DESx2 to the RCA. Residual disease in the LAD, and Lcx to be treated medically. Placed on DAPT with ASA/Brilinta for at least one year. Worked well with cardiac rehab without recurrent chest pain.  -- on ASA, brilinta, statin, BB, ACEi  2. HL: statin increased to atorva 80mg  daily. LDL 71, Trig 290. Could consider Vascepa as an outpatient pending follow up FLP/LFTs  3. HTN: low dose metoprolol added this admission. Lisinopril 10mg  daily (home med). Blood pressures are borderline soft, but suspect will improve as she is more active.   4. AAA/PAD: followed by [facility], Dr. Donnetta Hutching.   For questions or updates, please contact [place] Please consult [email or web address] for contact info under   Signed, Reino Bellis, NP  04/22/2019, 9:14 AM    Patient seen, examined. Available data reviewed. Agree with findings, assessment, and plan as outlined by Reino Bellis, NP.  On my exam, the patient is alert, oriented, in no distress.  JVP is normal, lungs are clear, heart is regular rate and rhythm with no murmur gallop, abdomen is  soft and nontender, extremities show no edema, skin is warm and dry with no rash.  The patient's cardiac catheterization study is reviewed.  I personally reviewed the images.  She underwent extensive stenting of the right coronary artery which was diffusely diseased and clearly her culprit vessel.  It appears her procedure was uncomplicated.  LVEF  50% by ventriculography with inferobasal hypokinesis.  Her medical program is reviewed.  We discussed the strategy of continuing Brilinta for 30 days, then likely transitioning to clopidogrel for cost reasons.  Otherwise she will be discharged on generic medications.  We reviewed follow-up with Dr. Raliegh Ip in [place].  She is not interested in outpatient cardiac rehab secondary to cost.  She is physically active and plans to resume her exercise on her own.  We discussed post MI restrictions.  Patient appears stable for hospital discharge today. She is on a high-intensity statin, beta blocker, ACE, and DAPT.   Sherren Mocha, M.D. 04/22/2019 11:19 AM

## 2019-04-22 NOTE — Telephone Encounter (Signed)
-----   Message from Desma Paganini sent at 04/22/2019  1:13 PM EDT ----- TOC w/ ML on 05/06/2019

## 2019-04-22 NOTE — Progress Notes (Signed)
CARDIAC REHAB PHASE I   PRE:  Rate/Rhythm: 75 SR  BP:  Supine:   Sitting: 90/74  Standing:    SaO2: 98%RA  MODE:  Ambulation: 550 ft   POST:  Rate/Rhythm: 87 SR  BP:  Supine:   Sitting: 109/70  Standing:    SaO2: 97%RA 0825-0942 Pt walked 550 ft on RA with steady gait and no CP. Tolerated well. MI education completed with pt who voiced understanding. Stressed importance of brilinta with stent. Reviewed NTG use, gave heart healthy diet(pt stated since GB out she can only eat certain foods), walking for ex, MI restrictions, and CRP 2. Pt stated she quit smoking in March. Congratulated pt on that accomplishment. Referred to [place] CRP 2. Pt not interested in attending program since no insurance but might be interested in APP. Pt is interested in participating in Virtual Cardiac and Pulmonary Rehab. Pt advised that Virtual Cardiac and Pulmonary Rehab is provided at no cost to the patient.  Checklist:  1. Pt has smart device  ie smartphone and/or ipad for downloading an app  Yes 2. Reliable internet/wifi service    Yes 3. Understands how to use their smartphone and navigate within an app.  Yes   Pt verbalized understanding and is in agreement.    Graylon Good, RN BSN  04/22/2019 9:37 AM

## 2019-04-23 LAB — POCT ACTIVATED CLOTTING TIME
Activated Clotting Time: 510 seconds
Activated Clotting Time: 296 seconds
Activated Clotting Time: 527 seconds

## 2019-04-24 ENCOUNTER — Telehealth: Payer: Self-pay | Admitting: *Deleted

## 2019-04-24 NOTE — Telephone Encounter (Signed)
-----   Message from Desma Paganini sent at 04/22/2019  1:13 PM EDT ----- TOC w/ ML on 05/06/2019

## 2019-04-24 NOTE — Telephone Encounter (Signed)
Patient contacted regarding discharge from [facility] on 04/15/19.  Patient understands to follow up with provider Ermalinda Barrios PA-C on 05/06/19  at 1:00 pm at [facility]. Patient understands discharge instructions? Yes  Patient understands medications and regiment? Yes Patient understands to bring all medications to this visit? Yes    Postop Surgical Patients:                What is your wound status? Any signs/ symptoms of infection (Temp, redness/ red streaks, swelling, purulent drainage, foul odor or smell)? .             Please do not place any creams/ lotions/ or antibiotic ointment on any surgical incisions/ wounds without physician approval. .             Do you have any questions about your medications?  All medications (except pain medications) are to be filled by your Cardiologist AFTER your first post op       appointment with them.  Are you taking your pain medication? .             How is your pain controlled? Pain level? .             If you require a refill on pain medications, know that the same medication/ amount may not be prescribed or a refill may not be given.  Please contact your pharmacy for refill requests.  .             Do you have help at home with ADL's?  If you have home health, have you been contacted or seen by the agency? .             Please refer to your Pre/post surgery booklet, there is a lot of useful information in it that may answer any questions you may have. .             Please note that it is ok to remove your surgical dressing, shower (soap/ water), and pat the incision dry.  For surgery related questions staff will route a phone note to CV DIV T[facility] pool  [facility] [place], [place] [id number]  For nonsurgery patients please delete the surgery questions.  For patients

## 2019-04-26 ENCOUNTER — Telehealth: Payer: Self-pay | Admitting: Cardiovascular Disease

## 2019-04-26 NOTE — Telephone Encounter (Signed)
I left message asking patient to call PCP regarding vomiting and diarrhea.

## 2019-04-26 NOTE — Telephone Encounter (Signed)
I will FYI Dr.Koneswaran 

## 2019-04-26 NOTE — Telephone Encounter (Signed)
Pt called stating she's been really sick with vomiting and diarrhea and she's not been able to keep any of her medications down and wanted to know what she should do.   Please call [phone]

## 2019-04-29 NOTE — Telephone Encounter (Signed)
I agree with PCP evaluation.

## 2019-05-01 ENCOUNTER — Telehealth: Payer: Self-pay | Admitting: Physician Assistant

## 2019-05-01 NOTE — Telephone Encounter (Signed)
Pt would like to know when she will be able to drive again

## 2019-05-01 NOTE — Telephone Encounter (Signed)
Pt will review at appt on 05/06/19

## 2019-05-01 NOTE — Progress Notes (Signed)
Cardiology Office Note    Date:  05/06/2019   ID:  Tara, Bright 12-Jun-1966, MRN [id number]  PCP:  Caryl Bis, MD  Cardiologist: Kate Sable, MD EPS: None  Chief Complaint  Patient presents with  . Hospitalization Follow-up    History of Present Illness:  Tara Bright is a 53 y.o. female with a history of HTN, TIA, PAD, former tobacco abuse, AAA-followed by Dr. Donnetta Hutching.  Patient had NSTEMI 04/21/19 peak troponin 2061 DES x2 RCA with residual disease in LAD and Cfx treated medically. Echo normal LVEF no WMA. lipitor increased and consider vascepa as outpatient.   Patient says she has good/bad days. Was dizzy this am. Walking 30 min/day. Complains of hands and feet being cold all the time. Fingers turn white. Also has infected mole like lesion on her upper left chest that is getting larger. Complains of her urethra becoming hard and swollen.no trouble urinating. Hasn't seen gynecologist since 2002.     Past Medical History:  Diagnosis Date  . Abdominal aortic aneurysm (AAA) ([place])   . Abdominal pain   . Abnormal weight loss   . Ataxia 2016   and visual changes, left AMA before workup completed  . CAD (coronary atherosclerotic disease)   . Hypertension   . Kidney stone   . Loss of appetite   . Nicotine dependence   . Right upper quadrant pain   . UTI (urinary tract infection)     Past Surgical History:  Procedure Laterality Date  . CHOLECYSTECTOMY N/A 12/21/2018   Procedure: LAPAROSCOPIC CHOLECYSTECTOMY;  Surgeon: Virl Cagey, MD;  Location: [facility];  Service: General;  Laterality: N/A;  . CORONARY STENT INTERVENTION N/A 04/21/2019   Procedure: CORONARY STENT INTERVENTION;  Surgeon: Belva Crome, MD;  Location: [facility];  Service: Cardiovascular;  Laterality: N/A;  . CYSTOSCOPY W/ URETERAL STENT PLACEMENT Right 04/05/2017   Procedure: CYSTOSCOPY WITH RETROGRADE PYELOGRAM/URETERAL STENT PLACEMENT;  Surgeon: Lucas Mallow, MD;  Location: [facility];  Service: Urology;  Laterality: Right;  . CYSTOSCOPY/URETEROSCOPY/HOLMIUM LASER/STENT PLACEMENT Right 04/26/2017   Procedure: CYSTOSCOPY/URETEROSCOPY/HOLMIUM LASER/STENT PLACEMENT;  Surgeon: Lucas Mallow, MD;  Location: [facility];  Service: Urology;  Laterality: Right;  . LEFT HEART CATH AND CORONARY ANGIOGRAPHY N/A 04/21/2019   Procedure: LEFT HEART CATH AND CORONARY ANGIOGRAPHY;  Surgeon: Belva Crome, MD;  Location: [facility];  Service: Cardiovascular;  Laterality: N/A;  . LITHOTRIPSY      Current Medications: Current Meds  Medication Sig  . ALPRAZolam (XANAX) 1 MG tablet Take 1 mg by mouth at bedtime.  Marland Kitchen aspirin EC 81 MG tablet Take 1 tablet (81 mg total) by mouth daily.  Marland Kitchen atorvastatin (LIPITOR) 80 MG tablet Take 1 tablet (80 mg total) by mouth daily at 6 PM.  . bisacodyl (DULCOLAX) 5 MG EC tablet Take 5 mg by mouth as needed for mild constipation or moderate constipation.   . cyclobenzaprine (FLEXERIL) 5 MG tablet Take 5 mg by mouth at bedtime as needed for muscle spasms.  Marland Kitchen lisinopril (PRINIVIL,ZESTRIL) 10 MG tablet Take 10 mg by mouth daily.  . metoprolol tartrate (LOPRESSOR) 25 MG tablet Take 1 tablet (25 mg total) by mouth 2 (two) times daily.  . naphazoline-pheniramine (VISINE) 0.025-0.3 % ophthalmic solution Place 1 drop into both eyes 4 (four) times daily as needed for eye irritation.   . nitroGLYCERIN (NITROSTAT) 0.4 MG SL tablet Place 1 tablet (0.4 mg total) under the tongue every 5 (five)  minutes x 3 doses as needed for chest pain.  . Probiotic Product (ALIGN) 4 MG CAPS Take 8 mg by mouth daily before breakfast.  . ticagrelor (BRILINTA) 90 MG TABS tablet Take 1 tablet (90 mg total) by mouth 2 (two) times daily.     Allergies:   Other, Codeine, and Penicillins   Social History   Socioeconomic History  . Marital status: Married    Spouse name: Not on file  . Number of children: Not on file  . Years of education: Not on file  . Highest education level:  Not on file  Occupational History  . Not on file  Social Needs  . Financial resource strain: Not on file  . Food insecurity    Worry: Not on file    Inability: Not on file  . Transportation needs    Medical: Not on file    Non-medical: Not on file  Tobacco Use  . Smoking status: Former Smoker    Packs/day: 0.50    Types: Cigarettes    Quit date: 10/04/2018    Years since quitting: 0.5  . Smokeless tobacco: Never Used  Substance and Sexual Activity  . Alcohol use: No  . Drug use: No  . Sexual activity: Yes    Birth control/protection: None  Lifestyle  . Physical activity    Days per week: Not on file    Minutes per session: Not on file  . Stress: Not on file  Relationships  . Social Herbalist on phone: Not on file    Gets together: Not on file    Attends religious service: Not on file    Active member of club or organization: Not on file    Attends meetings of clubs or organizations: Not on file    Relationship status: Not on file  Other Topics Concern  . Not on file  Social History Narrative  . Not on file     Family History:  The patient's family history includes Diabetes in her father; Heart failure in her father; Stomach cancer in her mother.   ROS:   Please see the history of present illness.    ROS All other systems reviewed and are negative.   PHYSICAL EXAM:   VS:  BP 124/80 (BP Location: Right Arm)   Pulse 66   Temp 98 F (36.7 C) (Temporal)   Ht 5\' 3"  (1.6 m)   Wt 116 lb (52.6 kg)   LMP 03/08/2017   SpO2 100%   BMI 20.55 kg/m   Physical Exam  GEN: Thin, in no acute distress  Neck: no JVD, carotid bruits, or masses Cardiac:RRR; no murmurs, rubs, or gallops  Respiratory:  clear to auscultation bilaterally, normal work of breathing GI: soft, nontender, nondistended, + BS Ext: fingers and toes white and ice cold. without cyanosis, clubbing, or edema, Good distal pulses bilaterally Skin: round dime sized lesion upper left chest that is  swollen, red around base and tip is peeling off. Neuro:  Alert and Oriented x 3, Strength and sensation are intact Psych: euthymic mood, full affect  Wt Readings from Last 3 Encounters:  05/06/19 116 lb (52.6 kg)  04/22/19 121 lb 6.4 oz (55.1 kg)  01/03/19 115 lb (52.2 kg)      Studies/Labs Reviewed:   EKG:  EKG is not ordered today.  Recent Labs: 04/20/2019: ALT 19; B Natriuretic Peptide 65.4 04/22/2019: BUN 8; Creatinine, Ser 0.76; Hemoglobin 13.4; Platelets 219; Potassium 3.5; Sodium 137  Lipid Panel    Component Value Date/Time   CHOL 156 04/21/2019 0338   TRIG 290 (H) 04/21/2019 0338   HDL 27 (L) 04/21/2019 0338   CHOLHDL 5.8 04/21/2019 0338   VLDL 58 (H) 04/21/2019 0338   LDLCALC 71 04/21/2019 0338    Additional studies/ records that were reviewed today include:   TTE: 04/22/19   IMPRESSIONS      1. Left ventricular ejection fraction, by visual estimation, is 65 to 70%. The left ventricle has normal function. Normal left ventricular size. There is no left ventricular hypertrophy.  2. Global right ventricle has normal systolic function.The right ventricular size is normal. No increase in right ventricular wall thickness.  3. Left atrial size was normal.  4. Right atrial size was normal.  5. The mitral valve is normal in structure. Trace mitral valve regurgitation. No evidence of mitral stenosis.  6. The tricuspid valve is normal in structure. Tricuspid valve regurgitation is trivial.  7. The aortic valve is normal in structure. Aortic valve regurgitation was not visualized by color flow Doppler. Structurally normal aortic valve, with no evidence of sclerosis or stenosis.  8. The pulmonic valve was normal in structure. Pulmonic valve regurgitation is not visualized by color flow Doppler.  9. TR signal is inadequate for assessing pulmonary artery systolic pressure. 10. The inferior vena cava is normal in size with greater than 50% respiratory variability, suggesting right  atrial pressure of 3 mmHg.  _____________ Cath: 04/21/19    Non-ST elevation myocardial infarction due to 99% mid RCA stenosis with TIMI flow II beyond the stenosis.  The lesion appears to be thrombotic.  Successful ostial to proximal and mid to distal RCA stenting was performed, reducing all areas of obstruction to less than 20%.  Final postdilatation balloon diameter and all segments stented with 3.75 mm (a 3.5 x 20 Synergy stent was placed proximally and a 38 x 3.0 stent placed in the mid to distal).  TIMI flow postintervention was normal.  Normal left main  30% mid LAD  60 to 70% mid circumflex, 60 to 70% second obtuse marginal, and 75% distal circumflex.  Low normal LV systolic function with EF 50% and inferobasal hypokinesis.  LVEDP is normal.   RECOMMENDATIONS:    IV nitroglycerin has been discontinued.  IV fluid at 75 cc/h will be continued until the a.m.  Aggrastat will run for 2 hours and then be discontinued.  Aspirin and Brilinta should be continued for 12 months and then consider switching to clopidogrel monotherapy.  Aggressive risk factor modification with LDL cholesterol less than 55.  We should also check an LP(a), hs-CRP.  Consider VASCEPA.       ASSESSMENT:    1. Coronary artery disease involving native coronary artery of native heart without angina pectoris   2. Essential hypertension   3. Mixed hyperlipidemia   4. AAA (abdominal aortic aneurysm) without rupture ([place])   5. Tobacco abuse   6. Infected skin lesion   7. Raynaud's disease without gangrene      PLAN:  In order of problems listed above:  CAD S/P  NSTEMI 04/21/19 peak troponin 2061 DES x2 RCA with residual disease in LAD and Cfx treated medically. Echo normal LVEF no WMA.on Brillinta, ASA. Walking 30 min/day. To start cardiac rehab. F/u with Dr. Bronson Ing in 2 months  HTN BP well controlled  HLD-lipitor increased. If trig still high will need vascepa-check flp and lft's in 3 months   AAA followed by Dr. Donnetta Hutching CT  07/2018  Tobacco abuse-quit smoking 6 years ago  Raynaud's -hands and feet white/cold as ice. With all her vascular issues will refer to Rheumatology for full work up  Skin lesion-refer to dermatologist  Urethra swelling and soreness per patient. No dysuria/freq. Asked her to f/u with PCP     Medication Adjustments/Labs and Tests Ordered: Current medicines are reviewed at length with the patient today.  Concerns regarding medicines are outlined above.  Medication changes, Labs and Tests ordered today are listed in the Patient Instructions below. There are no Patient Instructions on file for this visit.   Signed, Ermalinda Barrios, PA-C  05/06/2019 [facility] [place], [place],   [address] Phone: [phone]; Fax: [phone]

## 2019-05-01 NOTE — Telephone Encounter (Signed)
Returned pt call. NA. LMTCB.  

## 2019-05-06 ENCOUNTER — Other Ambulatory Visit: Payer: Self-pay

## 2019-05-06 ENCOUNTER — Encounter: Payer: Self-pay | Admitting: Physician Assistant

## 2019-05-06 ENCOUNTER — Ambulatory Visit (INDEPENDENT_AMBULATORY_CARE_PROVIDER_SITE_OTHER): Payer: Self-pay | Admitting: Physician Assistant

## 2019-05-06 VITALS — BP 124/80 | HR 66 | Temp 98.0°F | Ht 63.0 in | Wt 116.0 lb

## 2019-05-06 DIAGNOSIS — L089 Local infection of the skin and subcutaneous tissue, unspecified: Secondary | ICD-10-CM

## 2019-05-06 DIAGNOSIS — Z720 Tobacco use: Secondary | ICD-10-CM

## 2019-05-06 DIAGNOSIS — I2510 Atherosclerotic heart disease of native coronary artery without angina pectoris: Secondary | ICD-10-CM

## 2019-05-06 DIAGNOSIS — I10 Essential (primary) hypertension: Secondary | ICD-10-CM

## 2019-05-06 DIAGNOSIS — E782 Mixed hyperlipidemia: Secondary | ICD-10-CM

## 2019-05-06 DIAGNOSIS — I714 Abdominal aortic aneurysm, without rupture, unspecified: Secondary | ICD-10-CM

## 2019-05-06 DIAGNOSIS — I7300 Raynaud's syndrome without gangrene: Secondary | ICD-10-CM

## 2019-05-06 NOTE — Patient Instructions (Addendum)
Medication Instructions:  Your physician recommends that you continue on your current medications as directed. Please refer to the Current Medication list given to you today.   Labwork: 3 months  Liver & lipids   Testing/Procedures: none  Follow-Up: Your physician recommends that you schedule a follow-up appointment in: 2 months    Any Other Special Instructions Will Be Listed Below (If Applicable).  You have been referred to Rheumatology  You have been referred to dermatology     If you need a refill on your cardiac medications before your next appointment, please call your pharmacy.

## 2019-07-08 ENCOUNTER — Ambulatory Visit (INDEPENDENT_AMBULATORY_CARE_PROVIDER_SITE_OTHER): Payer: Self-pay | Admitting: Cardiovascular Disease

## 2019-07-08 ENCOUNTER — Encounter: Payer: Self-pay | Admitting: Cardiovascular Disease

## 2019-07-08 ENCOUNTER — Other Ambulatory Visit: Payer: Self-pay

## 2019-07-08 VITALS — BP 151/84 | HR 48 | Temp 97.5°F | Ht 63.0 in | Wt 117.0 lb

## 2019-07-08 DIAGNOSIS — E782 Mixed hyperlipidemia: Secondary | ICD-10-CM

## 2019-07-08 DIAGNOSIS — I25118 Atherosclerotic heart disease of native coronary artery with other forms of angina pectoris: Secondary | ICD-10-CM

## 2019-07-08 DIAGNOSIS — I714 Abdominal aortic aneurysm, without rupture, unspecified: Secondary | ICD-10-CM

## 2019-07-08 DIAGNOSIS — I10 Essential (primary) hypertension: Secondary | ICD-10-CM

## 2019-07-08 MED ORDER — LISINOPRIL 10 MG PO TABS: 10.0000 mg | ORAL_TABLET | Freq: Every day | ORAL | 3 refills | Status: DC

## 2019-07-08 MED ORDER — METOPROLOL TARTRATE 25 MG PO TABS: 12.5000 mg | ORAL_TABLET | Freq: Two times a day (BID) | ORAL | 0 refills | Status: DC

## 2019-07-08 NOTE — Addendum Note (Signed)
Addended by: Barbarann Ehlers A on: 07/08/2019 10:38 AM   Modules accepted: Orders

## 2019-07-08 NOTE — Progress Notes (Signed)
SUBJECTIVE: The patient presents for routine follow-up.  She sustained a non-STEMI in September 2020 and underwent drug-eluting stent placement x2 to the RCA.  She had residual disease in the circumflex and left obtuse marginal branch.  It was recommended she be maintained on dual antiplatelet therapy with aspirin and Brilinta for a minimum of 1 year after which time she could be switched to clopidogrel monotherapy.  She denies exertional chest pain.  She does have some occasional exertional dyspnea.  She denies leg swelling, palpitations, orthopnea and proximal nocturnal dyspnea.  She can feel her abdominal aortic pulsations and has to sit upright as if she is bent over she has discomfort.  She also complains of right arm pain.  Her hands and feet stay cold and she has been referred to rheumatology.  Brilinta is costing over $500 per month.    Review of Systems: As per "subjective", otherwise negative.  Allergies  Allergen Reactions  . Other Nausea And Vomiting and Other (See Comments)    Just about all pain medications cause Nausea and Vomiting. Must take Phenergan beforehand  . Codeine Nausea Only and Other (See Comments)    Gi upset  . Penicillins Hives and Nausea And Vomiting    Did it involve swelling of the face/tongue/throat, SOB, or low BP? No Did it involve sudden or severe rash/hives, skin peeling, or any reaction on the inside of your mouth or nose? Yes Did you need to seek medical attention at a hospital or doctor's office? Yes When did it last happen?Childhood If all above answers are "NO", may proceed with cephalosporin use.     Current Outpatient Medications  Medication Sig Dispense Refill  . aspirin EC 81 MG tablet Take 1 tablet (81 mg total) by mouth daily.    Marland Kitchen atorvastatin (LIPITOR) 80 MG tablet Take 1 tablet (80 mg total) by mouth daily at 6 PM. 90 tablet 1  . bisacodyl (DULCOLAX) 5 MG EC tablet Take 5 mg by mouth as needed for mild constipation or  moderate constipation.     Marland Kitchen lisinopril (PRINIVIL,ZESTRIL) 10 MG tablet Take 10 mg by mouth daily.    . metoprolol tartrate (LOPRESSOR) 25 MG tablet Take 1 tablet (25 mg total) by mouth 2 (two) times daily. 180 tablet 0  . naphazoline-pheniramine (VISINE) 0.025-0.3 % ophthalmic solution Place 1 drop into both eyes 4 (four) times daily as needed for eye irritation.     . nitroGLYCERIN (NITROSTAT) 0.4 MG SL tablet Place 1 tablet (0.4 mg total) under the tongue every 5 (five) minutes x 3 doses as needed for chest pain. 25 tablet 2  . ticagrelor (BRILINTA) 90 MG TABS tablet Take 1 tablet (90 mg total) by mouth 2 (two) times daily. 180 tablet 2   No current facility-administered medications for this visit.    Past Medical History:  Diagnosis Date  . Abdominal aortic aneurysm (AAA) ([place])   . Abdominal pain   . Abnormal weight loss   . Ataxia 2016   and visual changes, left AMA before workup completed  . CAD (coronary atherosclerotic disease)   . Hypertension   . Kidney stone   . Loss of appetite   . Nicotine dependence   . Right upper quadrant pain   . UTI (urinary tract infection)     Past Surgical History:  Procedure Laterality Date  . CHOLECYSTECTOMY N/A 12/21/2018   Procedure: LAPAROSCOPIC CHOLECYSTECTOMY;  Surgeon: Virl Cagey, MD;  Location: [facility];  Service: General;  Laterality: N/A;  . CORONARY STENT INTERVENTION N/A 04/21/2019   Procedure: CORONARY STENT INTERVENTION;  Surgeon: Belva Crome, MD;  Location: [facility];  Service: Cardiovascular;  Laterality: N/A;  . CYSTOSCOPY W/ URETERAL STENT PLACEMENT Right 04/05/2017   Procedure: CYSTOSCOPY WITH RETROGRADE PYELOGRAM/URETERAL STENT PLACEMENT;  Surgeon: Lucas Mallow, MD;  Location: [facility];  Service: Urology;  Laterality: Right;  . CYSTOSCOPY/URETEROSCOPY/HOLMIUM LASER/STENT PLACEMENT Right 04/26/2017   Procedure: CYSTOSCOPY/URETEROSCOPY/HOLMIUM LASER/STENT PLACEMENT;  Surgeon: Lucas Mallow, MD;   [facility];  Service: Urology;  Laterality: Right;  . LEFT HEART CATH AND CORONARY ANGIOGRAPHY N/A 04/21/2019   Procedure: LEFT HEART CATH AND CORONARY ANGIOGRAPHY;  Surgeon: Belva Crome, MD;  Location: [facility];  Service: Cardiovascular;  Laterality: N/A;  . LITHOTRIPSY      Social History   Socioeconomic History  . Marital status: Legally Separated    Spouse name: Not on file  . Number of children: Not on file  . Years of education: Not on file  . Highest education level: Not on file  Occupational History  . Not on file  Tobacco Use  . Smoking status: Former Smoker    Packs/day: 0.50    Types: Cigarettes    Quit date: 10/04/2018    Years since quitting: 0.7  . Smokeless tobacco: Never Used  Substance and Sexual Activity  . Alcohol use: No  . Drug use: No  . Sexual activity: Yes    Birth control/protection: None  Other Topics Concern  . Not on file  Social History Narrative  . Not on file   Social Determinants of Health   Financial Resource Strain:   . Difficulty of Paying Living Expenses: Not on file  Food Insecurity:   . Worried About Charity fundraiser in the Last Year: Not on file  . Ran Out of Food in the Last Year: Not on file  Transportation Needs:   . Lack of Transportation (Medical): Not on file  . Lack of Transportation (Non-Medical): Not on file  Physical Activity:   . Days of Exercise per Week: Not on file  . Minutes of Exercise per Session: Not on file  Stress:   . Feeling of Stress : Not on file  Social Connections:   . Frequency of Communication with Friends and Family: Not on file  . Frequency of Social Gatherings with Friends and Family: Not on file  . Attends Religious Services: Not on file  . Active Member of Clubs or Organizations: Not on file  . Attends Archivist Meetings: Not on file  . Marital Status: Not on file  Intimate Partner Violence:   . Fear of Current or Ex-Partner: Not on file  . Emotionally  Abused: Not on file  . Physically Abused: Not on file  . Sexually Abused: Not on file     Vitals:   07/08/19 0953  BP: (!) 151/84  Pulse: (!) 48  Temp: (!) 97.5 F (36.4 C)  SpO2: 99%  Height: 5\' 3"  (1.6 m)    Wt Readings from Last 3 Encounters:  05/06/19 116 lb (52.6 kg)  04/22/19 121 lb 6.4 oz (55.1 kg)  01/03/19 115 lb (52.2 kg)     PHYSICAL EXAM General: NAD HEENT: Normal. Neck: No JVD, no thyromegaly. Lungs: Clear to auscultation bilaterally with normal respiratory effort. CV: Regular rate and rhythm, normal S1/S2, no S3/S4, no murmur. No pretibial or periankle edema.  No carotid bruit.  Abdomen: Soft, nontender, no distention.  Neurologic: Alert and oriented.  Psych: Normal affect. Skin: Normal. Musculoskeletal: No gross deformities.      Labs: Lab Results  Component Value Date/Time   K 3.5 04/22/2019 09:46 AM   BUN 8 04/22/2019 09:46 AM   CREATININE 0.76 04/22/2019 09:46 AM   ALT 19 04/20/2019 12:22 PM   HGB 13.4 04/22/2019 09:46 AM     Lipids: Lab Results  Component Value Date/Time   LDLCALC 71 04/21/2019 03:38 AM   CHOL 156 04/21/2019 03:38 AM   TRIG 290 (H) 04/21/2019 03:38 AM   HDL 27 (L) 04/21/2019 03:38 AM      TTE: 04/22/19  IMPRESSIONS   1. Left ventricular ejection fraction, by visual estimation, is 65 to 70%. The left ventricle has normal function. Normal left ventricular size. There is no left ventricular hypertrophy. 2. Global right ventricle has normal systolic function.The right ventricular size is normal. No increase in right ventricular wall thickness. 3. Left atrial size was normal. 4. Right atrial size was normal. 5. The mitral valve is normal in structure. Trace mitral valve regurgitation. No evidence of mitral stenosis. 6. The tricuspid valve is normal in structure. Tricuspid valve regurgitation is trivial. 7. The aortic valve is normal in structure. Aortic valve regurgitation was not visualized by color flow  Doppler. Structurally normal aortic valve, with no evidence of sclerosis or stenosis. 8. The pulmonic valve was normal in structure. Pulmonic valve regurgitation is not visualized by color flow Doppler. 9. TR signal is inadequate for assessing pulmonary artery systolic pressure. 10. The inferior vena cava is normal in size with greater than 50% respiratory variability, suggesting right atrial pressure of 3 mmHg. _____________ Cath: 04/21/19   Non-ST elevation myocardial infarction due to 99% mid RCA stenosis with TIMI flow II beyond the stenosis. The lesion appears to be thrombotic.  Successful ostial to proximal and mid to distal RCA stenting was performed, reducing all areas of obstruction to less than 20%. Final postdilatation balloon diameter and all segments stented with 3.75 mm (a 3.5 x 20 Synergy stent was placed proximally and a 38 x 3.0 stent placed in the mid to distal). TIMI flow postintervention was normal.  Normal left main  30% mid LAD  60 to 70% mid circumflex, 60 to 70% second obtuse marginal, and 75% distal circumflex.  Low normal LV systolic function with EF 50% and inferobasal hypokinesis. LVEDP is normal.  RECOMMENDATIONS:   IV nitroglycerin has been discontinued.  IV fluid at 75 cc/h will be continued until the a.m.  Aggrastat will run for 2 hours and then be discontinued.  Aspirin and Brilinta should be continued for 12 months and then consider switching to clopidogrel monotherapy.  Aggressive risk factor modification with LDL cholesterol less than 55. We should also check an LP(a), hs-CRP.  Consider VASCEPA.    ASSESSMENT AND PLAN: 1.  Coronary artery disease: Non-STEMI in September 2020 with 2 drug-eluting stents placed to the RCA.  There was residual disease in the circumflex and second obtuse marginal branch.  This has been treated medically.  Continue aspirin, metoprolol, atorvastatin and Brilinta.  Given bradycardia and fatigue, I will  reduce metoprolol to 12.5 mg twice daily.  It was recommended by interventional cardiology that she be maintained on dual antiplatelet therapy for a minimum of 1 year with aspirin and Brilinta after which time she can be switched to clopidogrel monotherapy.  2.  Hypertension: Blood pressure is elevated.  She checks it routinely at home  and systolic readings are normally in the 120 range.  No changes to therapy.  3.  Hyperlipidemia: LDL 71 on 04/21/2019.  Triglycerides were elevated at 290.  She is scheduled for follow-up lipids.  If triglycerides remain elevated I would consider Vascepa.  4.  Abdominal aortic aneurysm: 3.2 cm in February 2020.  Followed by vascular surgery.  They recommended repeating in February 2022.    Disposition: Follow up 4 months virtually   Kate Sable, M.D., F.A.C.C.

## 2019-07-08 NOTE — Patient Instructions (Signed)
Medication Instructions:  DECREASE Metoprolol to 12.5 mg twice a day   *If you need a refill on your cardiac medications before your next appointment, please call your pharmacy*  Lab Work: None today If you have labs (blood work) drawn today and your tests are completely normal, you will receive your results only by: Marland Kitchen MyChart Message (if you have MyChart) OR . A paper copy in the mail If you have any lab test that is abnormal or we need to change your treatment, we will call you to review the results.  Testing/Procedures: None today  Follow-Up: At [facility] [place], you and your health needs are our priority.  As part of our continuing mission to provide you with exceptional heart care, we have created designated Provider Care Teams.  These Care Teams include your primary Cardiologist (physician) and Advanced Practice Providers (APPs -  Physician Assistants and Nurse Practitioners) who all work together to provide you with the care you need, when you need it.  Your next appointment:   4 month(s)  The format for your next appointment:   Virtual Visit   Provider:   You may see Kate Sable, MD or one of the following Advanced Practice Providers on your designated Care Team:    Bernerd Pho, PA-C      Other Instructions None       Thank you for choosing [facility] !

## 2019-07-16 ENCOUNTER — Other Ambulatory Visit: Payer: Self-pay | Admitting: Cardiovascular Disease

## 2019-09-15 ENCOUNTER — Other Ambulatory Visit: Payer: Self-pay | Admitting: Cardiovascular Disease

## 2019-10-17 ENCOUNTER — Ambulatory Visit: Payer: Self-pay

## 2019-11-07 ENCOUNTER — Telehealth (INDEPENDENT_AMBULATORY_CARE_PROVIDER_SITE_OTHER): Payer: Self-pay | Admitting: Cardiovascular Disease

## 2019-11-07 ENCOUNTER — Encounter: Payer: Self-pay | Admitting: Cardiovascular Disease

## 2019-11-07 VITALS — BP 111/88 | HR 89 | Ht 63.0 in | Wt 124.0 lb

## 2019-11-07 DIAGNOSIS — I10 Essential (primary) hypertension: Secondary | ICD-10-CM

## 2019-11-07 DIAGNOSIS — E785 Hyperlipidemia, unspecified: Secondary | ICD-10-CM

## 2019-11-07 DIAGNOSIS — I714 Abdominal aortic aneurysm, without rupture, unspecified: Secondary | ICD-10-CM

## 2019-11-07 DIAGNOSIS — Z8673 Personal history of transient ischemic attack (TIA), and cerebral infarction without residual deficits: Secondary | ICD-10-CM

## 2019-11-07 DIAGNOSIS — R209 Unspecified disturbances of skin sensation: Secondary | ICD-10-CM

## 2019-11-07 DIAGNOSIS — I25118 Atherosclerotic heart disease of native coronary artery with other forms of angina pectoris: Secondary | ICD-10-CM

## 2019-11-07 NOTE — Progress Notes (Signed)
Virtual Visit via Telephone Note   This visit type was conducted due to national recommendations for restrictions regarding the COVID-19 Pandemic (e.g. social distancing) in an effort to limit this patient's exposure and mitigate transmission in our community.  Due to her co-morbid illnesses, this patient is at least at moderate risk for complications without adequate follow up.  This format is felt to be most appropriate for this patient at this time.  The patient did not have access to video technology/had technical difficulties with video requiring transitioning to audio format only (telephone).  All issues noted in this document were discussed and addressed.  No physical exam could be performed with this format.  Please refer to the patient's chart for her  consent to telehealth for [facility].   The patient was identified using 2 identifiers.  Date:  11/07/2019   ID:  Tara Bright, DOB 1965-11-13, MRN [id number]  Patient Location: Home Provider Location: Office  PCP:  Caryl Bis, MD  Cardiologist:  Kate Sable, MD  Electrophysiologist:  None   Evaluation Performed:  Follow-Up Visit  Chief Complaint:  CAD  History of Present Illness:    Tara Bright is a 54 y.o. female with coronary artery disease. She sustained a non-STEMI in September 2020 and underwent drug-eluting stent placement x2 to the RCA.  She had residual disease in the circumflex and left obtuse marginal branch.  It was recommended she be maintained on dual antiplatelet therapy with aspirin and Brilinta for a minimum of 1 year after which time she could be switched to clopidogrel monotherapy.  At night, she has been having right arm pain which wakes her up on a nightly basis. If she sits upright on a chair, her right leg falls asleep after 30 minutes.  Her feet turn white intermittently.  She underwent left chest wall skin cancer removal in the recent past.  She gets scared at night and worries about  not waking up ever since her heart attack.  She quit smoking about a year ago.  She and her husband are separated.  She has 2 adult sons who live about 7 miles away and both work at [employer] in [place].  She is planning on going to [place] in the near future with her sons.  Past Medical History:  Diagnosis Date  . Abdominal aortic aneurysm (AAA) ([place])   . Abdominal pain   . Abnormal weight loss   . Ataxia 2016   and visual changes, left AMA before workup completed  . CAD (coronary atherosclerotic disease)   . Hypertension   . Kidney stone   . Loss of appetite   . Nicotine dependence   . Right upper quadrant pain   . UTI (urinary tract infection)    Past Surgical History:  Procedure Laterality Date  . CHOLECYSTECTOMY N/A 12/21/2018   Procedure: LAPAROSCOPIC CHOLECYSTECTOMY;  Surgeon: Virl Cagey, MD;  [facility];  Service: General;  Laterality: N/A;  . CORONARY STENT INTERVENTION N/A 04/21/2019   Procedure: CORONARY STENT INTERVENTION;  Surgeon: Belva Crome, MD;  Location: [place] [facility];  Service: Cardiovascular;  Laterality: N/A;  . CYSTOSCOPY W/ URETERAL STENT PLACEMENT Right 04/05/2017   Procedure: CYSTOSCOPY WITH RETROGRADE PYELOGRAM/URETERAL STENT PLACEMENT;  Surgeon: Lucas Mallow, MD;  Location: [facility];  Service: Urology;  Laterality: Right;  . CYSTOSCOPY/URETEROSCOPY/HOLMIUM LASER/STENT PLACEMENT Right 04/26/2017   Procedure: CYSTOSCOPY/URETEROSCOPY/HOLMIUM LASER/STENT PLACEMENT;  Surgeon: Lucas Mallow, MD;  [facility];  Service:  Urology;  Laterality: Right;  . LEFT HEART CATH AND CORONARY ANGIOGRAPHY N/A 04/21/2019   Procedure: LEFT HEART CATH AND CORONARY ANGIOGRAPHY;  Surgeon: Belva Crome, MD;  Location: [place] [facility];  Service: Cardiovascular;  Laterality: N/A;  . LITHOTRIPSY       Current Meds  Medication Sig  . aspirin EC 81 MG tablet Take 1 tablet (81 mg total) by mouth daily.  Marland Kitchen atorvastatin (LIPITOR) 80 MG  tablet Take 1 tablet (80 mg total) by mouth daily at 6 PM.  . bisacodyl (DULCOLAX) 5 MG EC tablet Take 5 mg by mouth as needed for mild constipation or moderate constipation.   Marland Kitchen lisinopril (ZESTRIL) 10 MG tablet Take 1 tablet (10 mg total) by mouth daily.  . metoprolol tartrate (LOPRESSOR) 25 MG tablet TAKE 0.5 TABLETS (12.5 MG TOTAL) BY MOUTH 2 (TWO) TIMES DAILY.  . naphazoline-pheniramine (VISINE) 0.025-0.3 % ophthalmic solution Place 1 drop into both eyes 4 (four) times daily as needed for eye irritation.   . nitroGLYCERIN (NITROSTAT) 0.4 MG SL tablet Place 1 tablet (0.4 mg total) under the tongue every 5 (five) minutes x 3 doses as needed for chest pain.  . ticagrelor (BRILINTA) 90 MG TABS tablet Take 1 tablet (90 mg total) by mouth 2 (two) times daily.     Allergies:   Other, Codeine, and Penicillins   Social History   Tobacco Use  . Smoking status: Former Smoker    Packs/day: 0.50    Types: Cigarettes    Quit date: 10/04/2018    Years since quitting: 1.0  . Smokeless tobacco: Never Used  Substance Use Topics  . Alcohol use: No  . Drug use: No     Family Hx: The patient's family history includes Alcohol abuse in her brother; Diabetes in her brother and father; Heart attack in her sister; Heart failure in her father; Hypertension in her brother; Kidney failure in her brother; Stomach cancer in her mother.  ROS:   Please see the history of present illness.     All other systems reviewed and are negative.   Prior CV studies:   The following studies were reviewed today:  TTE: 04/22/19  IMPRESSIONS   1. Left ventricular ejection fraction, by visual estimation, is 65 to 70%. The left ventricle has normal function. Normal left ventricular size. There is no left ventricular hypertrophy. 2. Global right ventricle has normal systolic function.The right ventricular size is normal. No increase in right ventricular wall thickness. 3. Left atrial size was normal. 4. Right  atrial size was normal. 5. The mitral valve is normal in structure. Trace mitral valve regurgitation. No evidence of mitral stenosis. 6. The tricuspid valve is normal in structure. Tricuspid valve regurgitation is trivial. 7. The aortic valve is normal in structure. Aortic valve regurgitation was not visualized by color flow Doppler. Structurally normal aortic valve, with no evidence of sclerosis or stenosis. 8. The pulmonic valve was normal in structure. Pulmonic valve regurgitation is not visualized by color flow Doppler. 9. TR signal is inadequate for assessing pulmonary artery systolic pressure. 10. The inferior vena cava is normal in size with greater than 50% respiratory variability, suggesting right atrial pressure of 3 mmHg. _____________ Cath: 04/21/19   Non-ST elevation myocardial infarction due to 99% mid RCA stenosis with TIMI flow II beyond the stenosis. The lesion appears to be thrombotic.  Successful ostial to proximal and mid to distal RCA stenting was performed, reducing all areas of obstruction to less than 20%. Final  postdilatation balloon diameter and all segments stented with 3.75 mm (a 3.5 x 20 Synergy stent was placed proximally and a 38 x 3.0 stent placed in the mid to distal). TIMI flow postintervention was normal.  Normal left main  30% mid LAD  60 to 70% mid circumflex, 60 to 70% second obtuse marginal, and 75% distal circumflex.  Low normal LV systolic function with EF 50% and inferobasal hypokinesis. LVEDP is normal.  RECOMMENDATIONS:   IV nitroglycerin has been discontinued.  IV fluid at 75 cc/h will be continued until the a.m.  Aggrastat will run for 2 hours and then be discontinued.  Aspirin and Brilinta should be continued for 12 months and then consider switching to clopidogrel monotherapy.  Aggressive risk factor modification with LDL cholesterol less than 55. We should also check an LP(a), hs-CRP.  Consider VASCEPA.  Labs/Other  Tests and Data Reviewed:    EKG:  No ECG reviewed.  Recent Labs: 04/20/2019: ALT 19; B Natriuretic Peptide 65.4 04/22/2019: BUN 8; Creatinine, Ser 0.76; Hemoglobin 13.4; Platelets 219; Potassium 3.5; Sodium 137   Recent Lipid Panel Lab Results  Component Value Date/Time   CHOL 156 04/21/2019 03:38 AM   TRIG 290 (H) 04/21/2019 03:38 AM   HDL 27 (L) 04/21/2019 03:38 AM   CHOLHDL 5.8 04/21/2019 03:38 AM   LDLCALC 71 04/21/2019 03:38 AM    Wt Readings from Last 3 Encounters:  11/07/19 124 lb (56.2 kg)  07/08/19 117 lb (53.1 kg)  05/06/19 116 lb (52.6 kg)     Objective:    Vital Signs:  BP 111/88   Pulse 89   Ht 5\' 3"  (1.6 m)   Wt 124 lb (56.2 kg)   LMP 03/08/2017   SpO2 98%   BMI 21.97 kg/m    VITAL SIGNS:  reviewed  ASSESSMENT & PLAN:    1.  Coronary artery disease: Non-STEMI in September 2020 with 2 drug-eluting stents placed to the RCA.    Symptomatically stable.  There was residual disease in the circumflex and second obtuse marginal branch.  This has been treated medically.  Continue aspirin, metoprolol, atorvastatin and Brilinta.  It was recommended by interventional cardiology that she be maintained on dual antiplatelet therapy for a minimum of 1 year with aspirin and Brilinta after which time she can be switched to clopidogrel monotherapy.  2.  Hypertension: Blood pressure is normal.  No changes to therapy.  3.  Hyperlipidemia: LDL 71 on 04/21/2019.  Triglycerides were elevated at 290.    I will obtain a copy of lipids from PCP.  If triglycerides remain elevated I would consider Vascepa.  4.  Abdominal aortic aneurysm: 3.2 cm in February 2020.  Followed by vascular surgery.  They recommended repeating in February 2022.  5.  History of TIA: Currently on aspirin, Brilinta, and atorvastatin.  Beginning in October, I will switch antiplatelet therapy to clopidogrel monotherapy.    COVID-19 Education: The signs and symptoms of COVID-19 were discussed with the  patient and how to seek care for testing (follow up with PCP or arrange E-visit).  The importance of social distancing was discussed today.  Time:   Today, I have spent 25 minutes with the patient with telehealth technology discussing the above problems.     Medication Adjustments/Labs and Tests Ordered: Current medicines are reviewed at length with the patient today.  Concerns regarding medicines are outlined above.   Tests Ordered: No orders of the defined types were placed in this encounter.   Medication Changes:  No orders of the defined types were placed in this encounter.   Follow Up:  Virtual Visit  in 3 month(s)  Signed, Kate Sable, MD  11/07/2019 11:05 AM    [place]

## 2019-11-07 NOTE — Patient Instructions (Signed)
Medication Instructions:  Your physician recommends that you continue on your current medications as directed. Please refer to the Current Medication list given to you today.  *If you need a refill on your cardiac medications before your next appointment, please call your pharmacy*   Lab Work: None today If you have labs (blood work) drawn today and your tests are completely normal, you will receive your results only by: . MyChart Message (if you have MyChart) OR . A paper copy in the mail If you have any lab test that is abnormal or we need to change your treatment, we will call you to review the results.   Testing/Procedures: None today   Follow-Up: At [facility], you and your health needs are our priority.  As part of our continuing mission to provide you with exceptional heart care, we have created designated Provider Care Teams.  These Care Teams include your primary Cardiologist (physician) and Advanced Practice Providers (APPs -  Physician Assistants and Nurse Practitioners) who all work together to provide you with the care you need, when you need it.  We recommend signing up for the patient portal called "MyChart".  Sign up information is provided on this After Visit Summary.  MyChart is used to connect with patients for Virtual Visits (Telemedicine).  Patients are able to view lab/test results, encounter notes, upcoming appointments, etc.  Non-urgent messages can be sent to your provider as well.   To learn more about what you can do with MyChart, go to [email or web address].    Your next appointment:   3 month(s)  The format for your next appointment:   Virtual Visit   Provider:   Suresh Koneswaran, MD   Other Instructions None       Thank you for choosing [facility] !         

## 2020-01-28 ENCOUNTER — Other Ambulatory Visit: Payer: Self-pay | Admitting: Cardiology

## 2020-02-04 ENCOUNTER — Other Ambulatory Visit: Payer: Self-pay

## 2020-02-04 ENCOUNTER — Encounter (HOSPITAL_COMMUNITY): Payer: Self-pay | Admitting: Emergency Medicine

## 2020-02-04 ENCOUNTER — Emergency Department (HOSPITAL_COMMUNITY): Payer: Self-pay

## 2020-02-04 ENCOUNTER — Emergency Department (HOSPITAL_COMMUNITY)
Admission: EM | Admit: 2020-02-04 | Discharge: 2020-02-04 | Disposition: A | Discharge status: Home / Self Care | Payer: Self-pay | Attending: Emergency Medicine | Admitting: Emergency Medicine

## 2020-02-04 DIAGNOSIS — Z5321 Procedure and treatment not carried out due to patient leaving prior to being seen by health care provider: Secondary | ICD-10-CM | POA: Insufficient documentation

## 2020-02-04 DIAGNOSIS — M79601 Pain in right arm: Secondary | ICD-10-CM | POA: Insufficient documentation

## 2020-02-04 DIAGNOSIS — R109 Unspecified abdominal pain: Secondary | ICD-10-CM | POA: Insufficient documentation

## 2020-02-04 DIAGNOSIS — M542 Cervicalgia: Secondary | ICD-10-CM | POA: Insufficient documentation

## 2020-02-04 DIAGNOSIS — R0602 Shortness of breath: Secondary | ICD-10-CM | POA: Insufficient documentation

## 2020-02-04 NOTE — ED Triage Notes (Signed)
Patient has shortness of breath that started in the middle of night, woke up around 9 am, with SOB and abdominal pain, and pain radiating in right arm to neck.  Has history of abdominal aneurysm.

## 2020-02-06 ENCOUNTER — Telehealth: Payer: Self-pay | Admitting: Cardiovascular Disease

## 2020-02-12 NOTE — Progress Notes (Signed)
Cardiology Office Note  Date: 02/13/2020   ID: Tara Bright, Tara Bright Dec 20, 1965, MRN [id number]  PCP:  Caryl Bis, MD  Cardiologist:  No primary care provider on file. Electrophysiologist:  None   Chief Complaint: CAD  History of Present Illness: Tara Bright is a 54 y.o. female with a history of CAD (NSTEMI September 2020 DES x2 to RCA, residual disease in LCx and left OM) recommended DAPT therapy x1 year then switched to clopidogrel monotherapy.  Last encounter with Dr. Bronson Ing 11/07/2019.  At night she was having right arm pain waking her up on a nightly basis.  Stated if she set up an chair her right leg would fall asleep after 30 minutes.  Stated her feet "turn white intermittently".  Recent visit to ED 02/04/2020. Triage notes stated she presented with c/o  SOB in the middle of the night. Woke at 9 am with SOB, abdominal pain with radiation to right arm and neck. No further documentation of visit noted.  Patient states he waited for quite some time and left without being seen due to the long wait..  Today she continues to complain of abdominal pain which seems to radiate up into her chest with an increased sensation of fullness in her chest.  States when she eats she feels similar symptoms.  Also complaining of recent increase in shortness of breath on exertion.  Relieved with rest.  Complains discomfort in her right arm and right leg increased when walking.  She has had previous ABI studies by Dr. Donnetta Hutching.  We discussed referral to GI specialist.  Patient wants to defer for now due to not having insurance and lack of affordability.  She denies any GERD symptoms, or blood in stool or urine.    Past Medical History:  Diagnosis Date  . Abdominal aortic aneurysm (AAA) ([place])   . Abdominal pain   . Abnormal weight loss   . Ataxia 2016   and visual changes, left AMA before workup completed  . CAD (coronary atherosclerotic disease)   . Hypertension   . Kidney stone   . Loss of  appetite   . Nicotine dependence   . Right upper quadrant pain   . UTI (urinary tract infection)     Past Surgical History:  Procedure Laterality Date  . CHOLECYSTECTOMY N/A 12/21/2018   Procedure: LAPAROSCOPIC CHOLECYSTECTOMY;  Surgeon: Virl Cagey, MD;  Location: [facility];  Service: General;  Laterality: N/A;  . CORONARY STENT INTERVENTION N/A 04/21/2019   Procedure: CORONARY STENT INTERVENTION;  Surgeon: Belva Crome, MD;  Location: [place] [facility];  Service: Cardiovascular;  Laterality: N/A;  . CYSTOSCOPY W/ URETERAL STENT PLACEMENT Right 04/05/2017   Procedure: CYSTOSCOPY WITH RETROGRADE PYELOGRAM/URETERAL STENT PLACEMENT;  Surgeon: Lucas Mallow, MD;  [facility];  Service: Urology;  Laterality: Right;  . CYSTOSCOPY/URETEROSCOPY/HOLMIUM LASER/STENT PLACEMENT Right 04/26/2017   Procedure: CYSTOSCOPY/URETEROSCOPY/HOLMIUM LASER/STENT PLACEMENT;  Surgeon: Lucas Mallow, MD;  [facility];  Service: Urology;  Laterality: Right;  . LEFT HEART CATH AND CORONARY ANGIOGRAPHY N/A 04/21/2019   Procedure: LEFT HEART CATH AND CORONARY ANGIOGRAPHY;  Surgeon: Belva Crome, MD;  Location: [place] [facility];  Service: Cardiovascular;  Laterality: N/A;  . LITHOTRIPSY      Current Outpatient Medications  Medication Sig Dispense Refill  . aspirin EC 81 MG tablet Take 1 tablet (81 mg total) by mouth daily.    Marland Kitchen atorvastatin (LIPITOR) 80 MG tablet TAKE 1 TABET BY MOUTH EVERY DAY  AT 6PM 90 tablet 2  . lisinopril (ZESTRIL) 10 MG tablet Take 1 tablet (10 mg total) by mouth daily. 90 tablet 3  . metoprolol tartrate (LOPRESSOR) 25 MG tablet TAKE 0.5 TABLETS (12.5 MG TOTAL) BY MOUTH 2 (TWO) TIMES DAILY. 90 tablet 1  . naphazoline-pheniramine (VISINE) 0.025-0.3 % ophthalmic solution Place 1 drop into both eyes 4 (four) times daily as needed for eye irritation.     . nitroGLYCERIN (NITROSTAT) 0.4 MG SL tablet Place 1 tablet (0.4 mg total) under the tongue every 5 (five) minutes x 3  doses as needed for chest pain. 25 tablet 2   No current facility-administered medications for this visit.   Allergies:  Other, Codeine, and Penicillins   Social History: The patient  reports that she quit smoking about 16 months ago. Her smoking use included cigarettes. She smoked 0.50 packs per day. She has never used smokeless tobacco. She reports that she does not drink alcohol and does not use drugs.   Family History: The patient's family history includes Alcohol abuse in her brother; Diabetes in her brother and father; Heart attack in her sister; Heart failure in her father; Hypertension in her brother; Kidney failure in her brother; Stomach cancer in her mother.   ROS:  Please see the history of present illness. Otherwise, complete review of systems is positive for none.  All other systems are reviewed and negative.   Physical Exam: VS:  BP (!) 142/82   Pulse 81   Ht 5\' 3"  (1.6 m)   Wt 126 lb (57.2 kg)   LMP 03/08/2017   SpO2 98%   BMI 22.32 kg/m , BMI Body mass index is 22.32 kg/m.  Wt Readings from Last 3 Encounters:  02/13/20 126 lb (57.2 kg)  02/04/20 128 lb (58.1 kg)  11/07/19 124 lb (56.2 kg)    General: Patient appears comfortable at rest. Neck: Supple, no elevated JVP or carotid bruits, no thyromegaly. Lungs: Clear to auscultation, nonlabored breathing at rest. Cardiac: Regular rate and rhythm, no S3 or significant systolic murmur, no pericardial rub. Extremities: No pitting edema, distal pulses 2+. Skin: Warm and dry. Musculoskeletal: No kyphosis. Neuropsychiatric: Alert and oriented x3, affect grossly appropriate.  ECG:  KG 02/04/2020 showed sinus rhythm rate of 86,  Recent Labwork: 04/20/2019: ALT 19; AST 25; B Natriuretic Peptide 65.4 04/22/2019: BUN 8; Creatinine, Ser 0.76; Hemoglobin 13.4; Platelets 219; Potassium 3.5; Sodium 137     Component Value Date/Time   CHOL 156 04/21/2019 0338   TRIG 290 (H) 04/21/2019 0338   HDL 27 (L) 04/21/2019 0338    CHOLHDL 5.8 04/21/2019 0338   VLDL 58 (H) 04/21/2019 0338   LDLCALC 71 04/21/2019 0338    Other Studies Reviewed Today:  TTE: 04/22/19 IMPRESSIONS 1. Left ventricular ejection fraction, by visual estimation, is 65 to 70%. The left ventricle has normal function. Normal left ventricular size. There is no left ventricular hypertrophy. 2. Global right ventricle has normal systolic function.The right ventricular size is normal. No increase in right ventricular wall thickness. 3. Left atrial size was normal. 4. Right atrial size was normal. 5. The mitral valve is normal in structure. Trace mitral valve regurgitation. No evidence of mitral stenosis. 6. The tricuspid valve is normal in structure. Tricuspid valve regurgitation is trivial. 7. The aortic valve is normal in structure. Aortic valve regurgitation was not visualized by color flow Doppler. Structurally normal aortic valve, with no evidence of sclerosis or stenosis. 8. The pulmonic valve was normal in structure. Pulmonic  valve regurgitation is not visualized by color flow Doppler. 9. TR signal is inadequate for assessing pulmonary artery systolic pressure. 10. The inferior vena cava is normal in size with greater than 50% respiratory variability, suggesting right atrial pressure of 3 mmHg.  Cath: 04/21/19  Non-ST elevation myocardial infarction due to 99% mid RCA stenosis with TIMI flow II beyond the stenosis. The lesion appears to be thrombotic.  Successful ostial to proximal and mid to distal RCA stenting was performed, reducing all areas of obstruction to less than 20%. Final postdilatation balloon diameter and all segments stented with 3.75 mm (a 3.5 x 20 Synergy stent was placed proximally and a 38 x 3.0 stent placed in the mid to distal). TIMI flow postintervention was normal.  Normal left main  30% mid LAD  60 to 70% mid circumflex, 60 to 70% second obtuse marginal, and 75% distal circumflex.  Low normal LV  systolic function with EF 50% and inferobasal hypokinesis. LVEDP is normal.  RECOMMENDATIONS:   IV nitroglycerin has been discontinued.  IV fluid at 75 cc/h will be continued until the a.m.  Aggrastat will run for 2 hours and then be discontinued.  Aspirin and Brilinta should be continued for 12 months and then consider switching to clopidogrel monotherapy.  Aggressive risk factor modification with LDL cholesterol less than 55. We should also check an LP(a), hs-CRP.  Consider VASCEPA.  Assessment and Plan:  1. CAD in native artery   2. Essential hypertension   3. AAA (abdominal aortic aneurysm) without rupture ([place])   4. History of TIA (transient ischemic attack)    1. CAD in native artery Denies any chest pain per se but does complain of abdominal pain which seems to radiate up into her chest area and causes her to have a feeling of increased fullness.  She does have some right arm pain which is intermittent not associated with and without exertion.  She is 10 months out from stenting on dual antiplatelet therapy with aspirin and Brilinta.  He is asking if she can stop Brilinta and changed to Plavix due to cost issues and financial affordability.  Please stop Brilinta and start Plavix 75 mg a day as well as continuing aspirin 81 mg daily.  Continue metoprolol 12.5 mg p.o. twice daily.  Continue nitroglycerin sublingual as needed.  Continue atorvastatin 80 mg daily.  2. Essential hypertension Blood pressure elevated today at 142/82.  Patient states her blood pressure at home is usually in the normal range.  Continue lisinopril 10 mg daily.  3. AAA (abdominal aortic aneurysm) without rupture ([facility]) Complaining of abdominal pain which radiates up into her chest with feeling of fullness in her chest.  She is concerned this may be her aneurysm.  Order an abdominal aortic ultrasound to recheck aneurysm.  4. History of TIA (transient ischemic attack) No focal neurologic deficits.   Patient does complain of some discomfort in her right arm and right leg.  No new CVA or TIA-like symptoms.   5.  Dyspnea. Complaining of increased dyspnea on exertion progressing over the last few months.  Please get a repeat echocardiogram.  Medication Adjustments/Labs and Tests Ordered: Current medicines are reviewed at length with the patient today.  Concerns regarding medicines are outlined above.   Disposition: Follow-up with Dr. Domenic Polite or APP 6 to 8 weeks  Signed, Levell July, NP 02/13/2020 11:46 AM    [place] [place], [place], [place] [address] Phone: [phone]; Fax: [phone])  [phone] 

## 2020-02-13 ENCOUNTER — Encounter: Payer: Self-pay | Admitting: Family Medicine

## 2020-02-13 ENCOUNTER — Ambulatory Visit (INDEPENDENT_AMBULATORY_CARE_PROVIDER_SITE_OTHER): Payer: Self-pay | Admitting: Family Medicine

## 2020-02-13 VITALS — BP 142/82 | HR 81 | Ht 63.0 in | Wt 126.0 lb

## 2020-02-13 DIAGNOSIS — I714 Abdominal aortic aneurysm, without rupture, unspecified: Secondary | ICD-10-CM

## 2020-02-13 DIAGNOSIS — R0602 Shortness of breath: Secondary | ICD-10-CM

## 2020-02-13 DIAGNOSIS — I2510 Atherosclerotic heart disease of native coronary artery without angina pectoris: Secondary | ICD-10-CM

## 2020-02-13 DIAGNOSIS — Z8673 Personal history of transient ischemic attack (TIA), and cerebral infarction without residual deficits: Secondary | ICD-10-CM

## 2020-02-13 DIAGNOSIS — I10 Essential (primary) hypertension: Secondary | ICD-10-CM

## 2020-02-13 MED ORDER — CLOPIDOGREL BISULFATE 75 MG PO TABS
75.0000 mg | ORAL_TABLET | Freq: Every day | ORAL | 6 refills | Status: DC
Start: 2020-02-13 — End: 2020-07-02

## 2020-02-13 NOTE — Patient Instructions (Signed)
Medication Instructions:   Stop Brilinta (Ticagrelor).   Begin Plavix 75mg  daily.   Continue all other medications.    Labwork: none  Testing/Procedures:  Your physician has requested that you have an echocardiogram. Echocardiography is a painless test that uses sound waves to create images of your heart. It provides your doctor with information about the size and shape of your heart and how well your heart's chambers and valves are working. This procedure takes approximately one hour. There are no restrictions for this procedure.  Your physician has requested that you have an abdominal aorta duplex. During this test, an ultrasound is used to evaluate the aorta. Allow 30 minutes for this exam. Do not eat after midnight the day before and avoid carbonated beverages  Office will contact with results via phone or letter.    Follow-Up: 6-8 weeks   Any Other Special Instructions Will Be Listed Below (If Applicable).  If you need a refill on your cardiac medications before your next appointment, please call your pharmacy.

## 2020-03-03 ENCOUNTER — Ambulatory Visit (INDEPENDENT_AMBULATORY_CARE_PROVIDER_SITE_OTHER): Payer: Self-pay

## 2020-03-03 ENCOUNTER — Other Ambulatory Visit: Payer: Self-pay | Admitting: Family Medicine

## 2020-03-03 DIAGNOSIS — R0602 Shortness of breath: Secondary | ICD-10-CM

## 2020-03-03 DIAGNOSIS — I714 Abdominal aortic aneurysm, without rupture, unspecified: Secondary | ICD-10-CM

## 2020-03-03 LAB — ECHOCARDIOGRAM COMPLETE
Area-P 1/2: 4.49 cm2
Calc EF: 70.5 %
MV M vel: 4.49 m/s
MV Peak grad: 80.6 mmHg
S' Lateral: 2.6 cm
Single Plane A2C EF: 69.7 %
Single Plane A4C EF: 71.5 %

## 2020-03-04 ENCOUNTER — Telehealth: Payer: Self-pay | Admitting: *Deleted

## 2020-03-04 DIAGNOSIS — I714 Abdominal aortic aneurysm, without rupture, unspecified: Secondary | ICD-10-CM

## 2020-03-04 NOTE — Telephone Encounter (Signed)
-----   Message from Verta Ellen., NP sent at 03/04/2020  2:48 PM EDT ----- Please call the patient and let her know the abdominal US showed the aneurysm is essentially unchanged from the previous study. The recent measurement was 3.3 cm The previous one was 3.2. We need to schedule a follow up study next year around the first week in August. Thanks

## 2020-03-04 NOTE — Telephone Encounter (Signed)
-----   Message from Verta Ellen., NP sent at 03/04/2020  3:10 PM EDT ----- Please call the patient and let her know the echocardiogram showed good pumping function of her heart. Very mildly leaking mitral valve. Nothing that would explain SOB. If she would like we could refer her to pulmonary evaluation of her lungs. She is a prior smoker. If she is interested, refer her to [facility]. Thanks

## 2020-03-04 NOTE — Telephone Encounter (Signed)
Pt voiced understanding and declined referral to pulmonary at this time - routed to pcp

## 2020-03-04 NOTE — Telephone Encounter (Signed)
Pt voiced understanding - will place orders for repeat AAA [place] and forward to schedulers for 1 year

## 2020-03-26 ENCOUNTER — Ambulatory Visit: Payer: Self-pay | Admitting: Family Medicine

## 2020-04-05 NOTE — Progress Notes (Addendum)
Cardiology Office Note  Date: 04/06/2020   ID: Tara Bright, DOB February 08, 1966, MRN [id number]  PCP:  Caryl Bis, MD  Cardiologist:  No primary care provider on file. Electrophysiologist:  None   Chief Complaint: CAD  History of Present Illness: Tara Bright is a 54 y.o. female with a history of CAD (NSTEMI September 2020 DES x2 to RCA, residual disease in LCx and left OM) recommended DAPT therapy x1 year then switched to clopidogrel monotherapy.  Last encounter with Dr. Bronson Ing 11/07/2019.  At night she was having right arm pain waking her up on a nightly basis.  Stated if she set up an chair her right leg would fall asleep after 30 minutes.  Stated her feet "turn white intermittently".  Recent visit to ED 02/04/2020. Triage notes stated she presented with c/o  SOB in the middle of the night. Woke at 9 am with SOB, abdominal pain with radiation to right arm and neck. No further documentation of visit noted.  Patient states he waited for quite some time and left without being seen due to the long wait..  At last visit she continued to complain of abdominal pain which seemed to radiate up into her chest with an increased sensation of fullness in her chest.  Stated when she ate she felt similar symptoms.  Also complained of recent increase in shortness of breath on exertion.  Relieved with rest.  Complained discomfort in her right arm and right leg increased when walking.  She has had previous ABI studies by Dr. Donnetta Hutching.  We discussed referral to GI specialist.  Patient wanted to to defer for now due to not having insurance and lack of affordability.  She denies any GERD symptoms, or blood in stool or urine.  She is here today for follow-up.  She denies any significant anginal symptoms.  She states she still has some abdominal pain she states her "aneurysm hurts" when doing more than usual activities such as lifting more than 10 pounds.  She states she was told by vascular specialist in the  past not to lift anything greater than 10 pounds.  States she was using 10 pound weights at home to exercise with and has no significant pain then.  Recent abdominal ultrasound showed aneurysm at 3.3 cm compared to previous 3.2 cm.  The aneurysm was approximately 5.6 cm in length.  She denies any anginal symptoms or nitroglycerin use.  No significant dyspnea on exertion, palpitations, orthostatic symptoms, CVA or TIA-like symptoms, bleeding issues, claudication-like symptoms.  States she is going to the gym approximately 3 times per week to exercise without any issues.  No DVT or PE-like symptoms, or lower extremity edema.    Past Medical History:  Diagnosis Date  . Abdominal aortic aneurysm (AAA) ([place])   . Abdominal pain   . Abnormal weight loss   . Ataxia 2016   and visual changes, left AMA before workup completed  . CAD (coronary atherosclerotic disease)   . Hypertension   . Kidney stone   . Loss of appetite   . Nicotine dependence   . Right upper quadrant pain   . UTI (urinary tract infection)     Past Surgical History:  Procedure Laterality Date  . CHOLECYSTECTOMY N/A 12/21/2018   Procedure: LAPAROSCOPIC CHOLECYSTECTOMY;  Surgeon: Virl Cagey, MD;  Location: [facility];  Service: General;  Laterality: N/A;  . CORONARY STENT INTERVENTION N/A 04/21/2019   Procedure: CORONARY STENT INTERVENTION;  Surgeon: Belva Crome, MD;  Location:  [facility];  Service: Cardiovascular;  Laterality: N/A;  . CYSTOSCOPY W/ URETERAL STENT PLACEMENT Right 04/05/2017   Procedure: CYSTOSCOPY WITH RETROGRADE PYELOGRAM/URETERAL STENT PLACEMENT;  Surgeon: Lucas Mallow, MD;  Location: [facility];  Service: Urology;  Laterality: Right;  . CYSTOSCOPY/URETEROSCOPY/HOLMIUM LASER/STENT PLACEMENT Right 04/26/2017   Procedure: CYSTOSCOPY/URETEROSCOPY/HOLMIUM LASER/STENT PLACEMENT;  Surgeon: Lucas Mallow, MD;  Location: [facility];  Service: Urology;  Laterality: Right;  . LEFT HEART CATH AND CORONARY  ANGIOGRAPHY N/A 04/21/2019   Procedure: LEFT HEART CATH AND CORONARY ANGIOGRAPHY;  Surgeon: Belva Crome, MD;  Location: [facility];  Service: Cardiovascular;  Laterality: N/A;  . LITHOTRIPSY      Current Outpatient Medications  Medication Sig Dispense Refill  . Ascorbic Acid (VITAMIN C) 1000 MG tablet Take 1,000 mg by mouth daily.    Marland Kitchen aspirin EC 81 MG tablet Take 1 tablet (81 mg total) by mouth daily.    Marland Kitchen atorvastatin (LIPITOR) 80 MG tablet TAKE 1 TABET BY MOUTH EVERY DAY AT 6PM 90 tablet 2  . clopidogrel (PLAVIX) 75 MG tablet Take 1 tablet (75 mg total) by mouth daily. 30 tablet 6  . lisinopril (ZESTRIL) 10 MG tablet Take 1 tablet (10 mg total) by mouth daily. 90 tablet 3  . metoprolol tartrate (LOPRESSOR) 25 MG tablet TAKE 0.5 TABLETS (12.5 MG TOTAL) BY MOUTH 2 (TWO) TIMES DAILY. 90 tablet 1  . naphazoline-pheniramine (VISINE) 0.025-0.3 % ophthalmic solution Place 1 drop into both eyes 4 (four) times daily as needed for eye irritation.     . nitroGLYCERIN (NITROSTAT) 0.4 MG SL tablet Place 1 tablet (0.4 mg total) under the tongue every 5 (five) minutes x 3 doses as needed for chest pain. 25 tablet 2  . pyridOXINE (VITAMIN B-6) 100 MG tablet Take 100 mg by mouth daily.    Marland Kitchen VITAMIN D, ERGOCALCIFEROL, PO Take by mouth.    . zinc gluconate 50 MG tablet Take 50 mg by mouth daily.     No current facility-administered medications for this visit.   Allergies:  Other, Codeine, and Penicillins   Social History: The patient  reports that she quit smoking about 18 months ago. Her smoking use included cigarettes. She smoked 0.50 packs per day. She has never used smokeless tobacco. She reports that she does not drink alcohol and does not use drugs.   Family History: The patient's family history includes Alcohol abuse in her brother; Diabetes in her brother and father; Heart attack in her sister; Heart failure in her father; Hypertension in her brother; Kidney failure in her brother; Stomach  cancer in her mother.   ROS:  Please see the history of present illness. Otherwise, complete review of systems is positive for none.  All other systems are reviewed and negative.   Physical Exam: VS:  BP 138/64   Pulse 72   Ht 5\' 3"  (1.6 m)   Wt 130 lb (59 kg)   LMP 03/08/2017   SpO2 98%   BMI 23.03 kg/m , BMI Body mass index is 23.03 kg/m.  Wt Readings from Last 3 Encounters:  04/06/20 130 lb (59 kg)  02/13/20 126 lb (57.2 kg)  02/04/20 128 lb (58.1 kg)    General: Patient appears comfortable at rest. Neck: Supple, no elevated JVP or carotid bruits, no thyromegaly. Lungs: Clear to auscultation, nonlabored breathing at rest. Cardiac: Regular rate and rhythm, no S3 or significant systolic murmur, no pericardial rub. Extremities: No pitting edema, distal pulses 2+.  Skin: Warm and dry. Musculoskeletal: No kyphosis. Neuropsychiatric: Alert and oriented x3, affect grossly appropriate.  ECG:  KG 02/04/2020 showed sinus rhythm rate of 86,  Recent Labwork: 04/20/2019: ALT 19; AST 25; B Natriuretic Peptide 65.4 04/22/2019: BUN 8; Creatinine, Ser 0.76; Hemoglobin 13.4; Platelets 219; Potassium 3.5; Sodium 137     Component Value Date/Time   CHOL 156 04/21/2019 0338   TRIG 290 (H) 04/21/2019 0338   HDL 27 (L) 04/21/2019 0338   CHOLHDL 5.8 04/21/2019 0338   VLDL 58 (H) 04/21/2019 0338   LDLCALC 71 04/21/2019 0338    Other Studies Reviewed Today:  Vascular US AAA 03/03/2020 Abdominal Aorta: There is evidence of abnormal dilatation of the mid Abdominal aorta. The largest aortic measurement is 3.3 cm. The largest aortic diameter remains essentially unchanged compared to prior exam. Previous diameter measurement was 3.2 cm.    Echocardiogram 03/03/2020 1. Left ventricular ejection fraction, by estimation, is 65 to 70%. The left ventricle has normal function. The left ventricle has no regional wall motion abnormalities. Left ventricular diastolic parameters were normal. 2. Right  ventricular systolic function is normal. The right ventricular size is normal. There is normal pulmonary artery systolic pressure. 3. The mitral valve is normal in structure. Trivial mitral valve regurgitation. No evidence of mitral stenosis. 4. The aortic valve is tricuspid. Aortic valve regurgitation is not visualized. No aortic stenosis is present. 5. The inferior vena cava is normal in size with greater than 50% respiratory variability, suggesting right atrial pressure of 3 mmHg.   TTE: 04/22/19 IMPRESSIONS 1. Left ventricular ejection fraction, by visual estimation, is 65 to 70%. The left ventricle has normal function. Normal left ventricular size. There is no left ventricular hypertrophy. 2. Global right ventricle has normal systolic function.The right ventricular size is normal. No increase in right ventricular wall thickness. 3. Left atrial size was normal. 4. Right atrial size was normal. 5. The mitral valve is normal in structure. Trace mitral valve regurgitation. No evidence of mitral stenosis. 6. The tricuspid valve is normal in structure. Tricuspid valve regurgitation is trivial. 7. The aortic valve is normal in structure. Aortic valve regurgitation was not visualized by color flow Doppler. Structurally normal aortic valve, with no evidence of sclerosis or stenosis. 8. The pulmonic valve was normal in structure. Pulmonic valve regurgitation is not visualized by color flow Doppler. 9. TR signal is inadequate for assessing pulmonary artery systolic pressure. 10. The inferior vena cava is normal in size with greater than 50% respiratory variability, suggesting right atrial pressure of 3 mmHg.  Cath: 04/21/19  Non-ST elevation myocardial infarction due to 99% mid RCA stenosis with TIMI flow II beyond the stenosis. The lesion appears to be thrombotic.  Successful ostial to proximal and mid to distal RCA stenting was performed, reducing all areas of obstruction to less  than 20%. Final postdilatation balloon diameter and all segments stented with 3.75 mm (a 3.5 x 20 Synergy stent was placed proximally and a 38 x 3.0 stent placed in the mid to distal). TIMI flow postintervention was normal.  Normal left main  30% mid LAD  60 to 70% mid circumflex, 60 to 70% second obtuse marginal, and 75% distal circumflex.  Low normal LV systolic function with EF 50% and inferobasal hypokinesis. LVEDP is normal.  RECOMMENDATIONS:   IV nitroglycerin has been discontinued.  IV fluid at 75 cc/h will be continued until the a.m.  Aggrastat will run for 2 hours and then be discontinued.  Aspirin and Brilinta should be  continued for 12 months and then consider switching to clopidogrel monotherapy.  Aggressive risk factor modification with LDL cholesterol less than 55. We should also check an LP(a), hs-CRP.  Consider VASCEPA.  Assessment and Plan:   1. CAD in native artery Denies any chest pain per se but does complain of abdominal pain which seems to radiate up into her chest area and causes her to have a feeling of increased fullness.  States he does have some abdominal pain when performing more than usual exertional activity.  She states she can feel the aneurysm.  Continue metoprolol 12.5 mg p.o. twice daily.  Continue nitroglycerin sublingual as needed.  Continue atorvastatin 80 mg daily.  Continue aspirin 81 mg and Plavix 75 mg daily.  2. Essential hypertension Blood pressure today 138/64.Marland Kitchen  Patient states her blood pressure at home is usually in the normal range.  Continue lisinopril 10 mg daily.  Continue metoprolol 12.5 mg p.o. twice daily.  3. AAA (abdominal aortic aneurysm) without rupture (HCC) Complaining of abdominal pain which radiates up into her chest with feeling of fullness in her chest.  She is concerned this may be her aneurysm.  Recent abdominal aortic ultrasound showed aneurysm 3.3 cm versus previous study showing 3.2 cm February  2020    4. History of TIA (transient ischemic attack) No focal neurologic deficits.  Patient does complain of some discomfort in her right arm and right leg.  No new CVA or TIA-like symptoms.   5.  Dyspnea. No recent complaints of increased dyspnea.  She is going to the gym approximately 3 times a week to exercise without any significant anginal or exertional symptoms.  Medication Adjustments/Labs and Tests Ordered: Current medicines are reviewed at length with the patient today.  Concerns regarding medicines are outlined above.   Disposition: Follow-up with Dr. Domenic Polite or APP 6 months.  Signed, Levell July, NP 04/06/2020 11:35 AM    [facility] [place], [place], [place] [address] Phone: [phone]; Fax: [phone]

## 2020-04-06 ENCOUNTER — Ambulatory Visit (INDEPENDENT_AMBULATORY_CARE_PROVIDER_SITE_OTHER): Payer: Self-pay | Admitting: Family Medicine

## 2020-04-06 ENCOUNTER — Encounter: Payer: Self-pay | Admitting: Family Medicine

## 2020-04-06 ENCOUNTER — Other Ambulatory Visit: Payer: Self-pay

## 2020-04-06 VITALS — BP 138/64 | HR 72 | Ht 63.0 in | Wt 130.0 lb

## 2020-04-06 DIAGNOSIS — I714 Abdominal aortic aneurysm, without rupture, unspecified: Secondary | ICD-10-CM

## 2020-04-06 DIAGNOSIS — Z8673 Personal history of transient ischemic attack (TIA), and cerebral infarction without residual deficits: Secondary | ICD-10-CM

## 2020-04-06 DIAGNOSIS — R0600 Dyspnea, unspecified: Secondary | ICD-10-CM

## 2020-04-06 DIAGNOSIS — I10 Essential (primary) hypertension: Secondary | ICD-10-CM

## 2020-04-06 DIAGNOSIS — I2510 Atherosclerotic heart disease of native coronary artery without angina pectoris: Secondary | ICD-10-CM

## 2020-04-06 NOTE — Patient Instructions (Addendum)
Medication Instructions:  Continue all current medications.   Labwork: none  Testing/Procedures: none  Follow-Up: 6 months   Any Other Special Instructions Will Be Listed Below (If Applicable).   If you need a refill on your cardiac medications before your next appointment, please call your pharmacy.  

## 2020-06-25 ENCOUNTER — Other Ambulatory Visit: Payer: Self-pay

## 2020-06-25 MED ORDER — METOPROLOL TARTRATE 25 MG PO TABS: 12.5000 mg | ORAL_TABLET | Freq: Two times a day (BID) | ORAL | 1 refills | Status: DC

## 2020-07-01 ENCOUNTER — Other Ambulatory Visit: Payer: Self-pay | Admitting: Family Medicine

## 2020-07-03 ENCOUNTER — Telehealth: Payer: Self-pay | Admitting: Family Medicine

## 2020-07-03 MED ORDER — METOPROLOL TARTRATE 25 MG PO TABS: 12.5000 mg | ORAL_TABLET | Freq: Two times a day (BID) | ORAL | 2 refills | Status: DC

## 2020-07-03 MED ORDER — ATORVASTATIN CALCIUM 80 MG PO TABS: ORAL_TABLET | ORAL | 2 refills | Status: DC

## 2020-07-03 NOTE — Telephone Encounter (Signed)
New message     *STAT* If patient is at the pharmacy, call can be transferred to refill team.   1. Which medications need to be refilled? (please list name of each medication and dose if known) metoprolol tartrate (LOPRESSOR) 25 MG tablet  Do they need a 30 day or 90 day supply?  90   atorvastatin (LIPITOR) 80 MG tablet  Do they need a 30 day or 90 day supply?  30    2. Which pharmacy/location (including street and city if local pharmacy) is medication to be sent to?[facility] [place]

## 2020-09-10 ENCOUNTER — Telehealth: Payer: Self-pay | Admitting: Family Medicine

## 2020-09-10 MED ORDER — LISINOPRIL 10 MG PO TABS: 10.0000 mg | ORAL_TABLET | Freq: Every day | ORAL | 0 refills | Status: DC

## 2020-09-10 MED ORDER — CLOPIDOGREL BISULFATE 75 MG PO TABS: 75.0000 mg | ORAL_TABLET | Freq: Every day | ORAL | 0 refills | Status: DC

## 2020-09-10 NOTE — Telephone Encounter (Signed)
    1. Which medications need to be refilled? (please list name of each medication and dose if known)  PLAVIX 75 MG   2. Which pharmacy/location (including street and city if local pharmacy) is medication to be sent to?  [facility] [place]   3. Do they need a 30 day or 90 day supply? [place]

## 2020-09-10 NOTE — Telephone Encounter (Signed)
Medication sent to pharmacy  

## 2020-10-09 ENCOUNTER — Ambulatory Visit: Payer: Self-pay | Admitting: Cardiology

## 2020-10-15 NOTE — Progress Notes (Signed)
Cardiology Office Note  Date: 10/15/2020   ID: Tara Bright, Tara Bright 05-Jul-1966, MRN [id number]  PCP:  Caryl Bis, MD  Cardiologist:  No primary care provider on file. Electrophysiologist:  None   Chief Complaint: CAD  History of Present Illness: Tara Bright is a 55 y.o. female with a history of CAD (NSTEMI September 2020 DES x2 to RCA, residual disease in LCx and left OM) recommended DAPT therapy x1 year then switched to clopidogrel monotherapy.  Last encounter with Dr. Bronson Ing 11/07/2019.  At night she was having right arm pain waking her up on a nightly basis.  Stated if she set up an chair her right leg would fall asleep after 30 minutes.  Stated her feet "turn white intermittently".  Recent visit to ED 02/04/2020. Triage notes stated she presented with c/o  SOB in the middle of the night. Woke at 9 am with SOB, abdominal pain with radiation to right arm and neck. No further documentation of visit noted.  Patient states he waited for quite some time and left without being seen due to the long wait..  At last visit she continued to complain of abdominal pain which seemed to radiate up into her chest with an increased sensation of fullness in her chest.  Stated when she ate she felt similar symptoms.  Also complained of recent increase in shortness of breath on exertion.  Relieved with rest.  Complained discomfort in her right arm and right leg increased when walking.  She has had previous ABI studies by Dr. Donnetta Hutching.  We discussed referral to GI specialist.  Patient wanted to to defer for now due to not having insurance and lack of affordability.  She denies any GERD symptoms, or blood in stool or urine.  She is here for follow-up.  States she recently had an episode with her rapid heart rate while she was on the treadmill at [employer].  She states the treadmill registered her heart rate is 180.  She states she had some shortness of breath when this occurred.  States she recently had  pneumonia in the interim since last visit and still has a lingering cough.  She denies any anginal or exertional symptoms other than recent shortness of breath when heart rate was elevated.  Denies any CVA or TIA-like symptoms.  No further palpitations or racing heart per her statement.  No bleeding issues.  No PND orthopnea.  No lower extremity edema.  No DVT or PE-like symptoms.  No claudication-like symptoms.  EKG today shows normal sinus rhythm with a rate of 63.    Past Medical History:  Diagnosis Date   Abdominal aortic aneurysm (AAA) (HCC)    Abdominal pain    Abnormal weight loss    Ataxia 2016   and visual changes, left AMA before workup completed   CAD (coronary atherosclerotic disease)    Hypertension    Kidney stone    Loss of appetite    Nicotine dependence    Right upper quadrant pain    UTI (urinary tract infection)     Past Surgical History:  Procedure Laterality Date   CHOLECYSTECTOMY N/A 12/21/2018   Procedure: LAPAROSCOPIC CHOLECYSTECTOMY;  Surgeon: Virl Cagey, MD;  Location: [facility];  Service: General;  Laterality: N/A;   CORONARY STENT INTERVENTION N/A 04/21/2019   Procedure: CORONARY STENT INTERVENTION;  Surgeon: Belva Crome, MD;  Location: [facility];  Service: Cardiovascular;  Laterality: N/A;   CYSTOSCOPY W/ URETERAL STENT PLACEMENT Right  04/05/2017   Procedure: CYSTOSCOPY WITH RETROGRADE PYELOGRAM/URETERAL STENT PLACEMENT;  Surgeon: Lucas Mallow, MD;  Location: [facility];  Service: Urology;  Laterality: Right;   CYSTOSCOPY/URETEROSCOPY/HOLMIUM LASER/STENT PLACEMENT Right 04/26/2017   Procedure: CYSTOSCOPY/URETEROSCOPY/HOLMIUM LASER/STENT PLACEMENT;  Surgeon: Lucas Mallow, MD;  Location: [facility];  Service: Urology;  Laterality: Right;   LEFT HEART CATH AND CORONARY ANGIOGRAPHY N/A 04/21/2019   Procedure: LEFT HEART CATH AND CORONARY ANGIOGRAPHY;  Surgeon: Belva Crome, MD;  Location: [facility];  Service:  Cardiovascular;  Laterality: N/A;   LITHOTRIPSY      Current Outpatient Medications  Medication Sig Dispense Refill   Ascorbic Acid (VITAMIN C) 1000 MG tablet Take 1,000 mg by mouth daily.     aspirin EC 81 MG tablet Take 1 tablet (81 mg total) by mouth daily.     atorvastatin (LIPITOR) 80 MG tablet TAKE 1 TABET BY MOUTH EVERY DAY AT 6PM 90 tablet 2   clopidogrel (PLAVIX) 75 MG tablet Take 1 tablet (75 mg total) by mouth daily. 90 tablet 0   lisinopril (ZESTRIL) 10 MG tablet Take 1 tablet (10 mg total) by mouth daily. 90 tablet 0   metoprolol tartrate (LOPRESSOR) 25 MG tablet Take 0.5 tablets (12.5 mg total) by mouth 2 (two) times daily. 90 tablet 2   naphazoline-pheniramine (VISINE) 0.025-0.3 % ophthalmic solution Place 1 drop into both eyes 4 (four) times daily as needed for eye irritation.      nitroGLYCERIN (NITROSTAT) 0.4 MG SL tablet Place 1 tablet (0.4 mg total) under the tongue every 5 (five) minutes x 3 doses as needed for chest pain. 25 tablet 2   pyridOXINE (VITAMIN B-6) 100 MG tablet Take 100 mg by mouth daily.     VITAMIN D, ERGOCALCIFEROL, PO Take by mouth.     zinc gluconate 50 MG tablet Take 50 mg by mouth daily.     No current facility-administered medications for this visit.   Allergies:  Other, Codeine, and Penicillins   Social History: The patient  reports that she quit smoking about 2 years ago. Her smoking use included cigarettes. She smoked 0.50 packs per day. She has never used smokeless tobacco. She reports that she does not drink alcohol and does not use drugs.   Family History: The patient's family history includes Alcohol abuse in her brother; Diabetes in her brother and father; Heart attack in her sister; Heart failure in her father; Hypertension in her brother; Kidney failure in her brother; Stomach cancer in her mother.   ROS:  Please see the history of present illness. Otherwise, complete review of systems is positive for none.  All other systems  are reviewed and negative.   Physical Exam: VS:  LMP 03/08/2017 , BMI There is no height or weight on file to calculate BMI.  Wt Readings from Last 3 Encounters:  04/06/20 130 lb (59 kg)  02/13/20 126 lb (57.2 kg)  02/04/20 128 lb (58.1 kg)    General: Patient appears comfortable at rest. Neck: Supple, no elevated JVP or carotid bruits, no thyromegaly. Lungs: Clear to auscultation, nonlabored breathing at rest. Cardiac: Regular rate and rhythm, no S3 or significant systolic murmur, no pericardial rub. Extremities: No pitting edema, distal pulses 2+. Skin: Warm and dry. Musculoskeletal: No kyphosis. Neuropsychiatric: Alert and oriented x3, affect grossly appropriate.  ECG: EKG today normal sinus rhythm rate of 63.  Recent Labwork: No results found for requested labs within last 8760 hours.     Component Value  Date/Time   CHOL 156 04/21/2019 0338   TRIG 290 (H) 04/21/2019 0338   HDL 27 (L) 04/21/2019 0338   CHOLHDL 5.8 04/21/2019 0338   VLDL 58 (H) 04/21/2019 0338   LDLCALC 71 04/21/2019 0338    Other Studies Reviewed Today:  Vascular US AAA 03/03/2020 Abdominal Aorta: There is evidence of abnormal dilatation of the mid Abdominal aorta. The largest aortic measurement is 3.3 cm. The largest aortic diameter remains essentially unchanged compared to prior exam. Previous diameter measurement was 3.2 cm.    Echocardiogram 03/03/2020 1. Left ventricular ejection fraction, by estimation, is 65 to 70%. The left ventricle has normal function. The left ventricle has no regional wall motion abnormalities. Left ventricular diastolic parameters were normal. 2. Right ventricular systolic function is normal. The right ventricular size is normal. There is normal pulmonary artery systolic pressure. 3. The mitral valve is normal in structure. Trivial mitral valve regurgitation. No evidence of mitral stenosis. 4. The aortic valve is tricuspid. Aortic valve regurgitation is not visualized.  No aortic stenosis is present. 5. The inferior vena cava is normal in size with greater than 50% respiratory variability, suggesting right atrial pressure of 3 mmHg.   TTE: 04/22/19 IMPRESSIONS 1. Left ventricular ejection fraction, by visual estimation, is 65 to 70%. The left ventricle has normal function. Normal left ventricular size. There is no left ventricular hypertrophy. 2. Global right ventricle has normal systolic function.The right ventricular size is normal. No increase in right ventricular wall thickness. 3. Left atrial size was normal. 4. Right atrial size was normal. 5. The mitral valve is normal in structure. Trace mitral valve regurgitation. No evidence of mitral stenosis. 6. The tricuspid valve is normal in structure. Tricuspid valve regurgitation is trivial. 7. The aortic valve is normal in structure. Aortic valve regurgitation was not visualized by color flow Doppler. Structurally normal aortic valve, with no evidence of sclerosis or stenosis. 8. The pulmonic valve was normal in structure. Pulmonic valve regurgitation is not visualized by color flow Doppler. 9. TR signal is inadequate for assessing pulmonary artery systolic pressure. 10. The inferior vena cava is normal in size with greater than 50% respiratory variability, suggesting right atrial pressure of 3 mmHg.  Cath: 04/21/19  Non-ST elevation myocardial infarction due to 99% mid RCA stenosis with TIMI flow II beyond the stenosis. The lesion appears to be thrombotic.  Successful ostial to proximal and mid to distal RCA stenting was performed, reducing all areas of obstruction to less than 20%. Final postdilatation balloon diameter and all segments stented with 3.75 mm (a 3.5 x 20 Synergy stent was placed proximally and a 38 x 3.0 stent placed in the mid to distal). TIMI flow postintervention was normal.  Normal left main  30% mid LAD  60 to 70% mid circumflex, 60 to 70% second obtuse marginal, and  75% distal circumflex.  Low normal LV systolic function with EF 50% and inferobasal hypokinesis. LVEDP is normal.  RECOMMENDATIONS:   IV nitroglycerin has been discontinued.  IV fluid at 75 cc/h will be continued until the a.m.  Aggrastat will run for 2 hours and then be discontinued.  Aspirin and Brilinta should be continued for 12 months and then consider switching to clopidogrel monotherapy.  Aggressive risk factor modification with LDL cholesterol less than 55. We should also check an LP(a), hs-CRP.  Consider VASCEPA.  Assessment and Plan:   1. CAD in native artery Denies any current anginal or exertional symptoms.  Continue metoprolol 12.5 mg p.o. twice daily.  Continue nitroglycerin sublingual as needed.  Continue atorvastatin 80 mg daily.  Continue aspirin 81 mg and Plavix 75 mg daily.  2. Essential hypertension Blood pressure well controlled today at 128/80.Marland Kitchen Continue lisinopril 10 mg daily.  Continue metoprolol 12.5 mg p.o. twice daily.  3. AAA (abdominal aortic aneurysm) without rupture (HCC) Most recent abdominal aortic ultrasound showed aneurysm 3.3 cm versus previous study showing 3.2 cm February 2020.  She has an upcoming study August 11 at 8 AM for recheck.  States sometimes she can feel the aneurysm.  4. History of TIA (transient ischemic attack) No focal neurologic deficits.   No new CVA or TIA-like symptoms.  5.  Dyspnea. No recent complaints of increased dyspnea.  She is going to the gym approximately 3 times a week to exercise without any significant anginal or exertional symptoms.  6.  Racing heart States she had a recent episode of racing heart up in the 180s when walking on the treadmill at the gym.  States she has some associated shortness of breath.  Please get a 14-day ZIO monitor to assess for arrhythmias.  Medication Adjustments/Labs and Tests Ordered: Current medicines are reviewed at length with the patient today.  Concerns regarding  medicines are outlined above.   Disposition: Follow-up with Dr. Harl Bowie or APP 6 to 8 weeks Signed, Levell July, NP 10/15/2020 8:21 PM    [facility] [place], [place], [place] [address] Phone: [phone]; Fax: [phone]

## 2020-10-16 ENCOUNTER — Encounter: Payer: Self-pay | Admitting: Family Medicine

## 2020-10-16 ENCOUNTER — Ambulatory Visit (INDEPENDENT_AMBULATORY_CARE_PROVIDER_SITE_OTHER): Payer: Self-pay | Admitting: Family Medicine

## 2020-10-16 ENCOUNTER — Other Ambulatory Visit: Payer: Self-pay

## 2020-10-16 VITALS — BP 128/80 | HR 64 | Ht 63.0 in | Wt 128.4 lb

## 2020-10-16 DIAGNOSIS — I714 Abdominal aortic aneurysm, without rupture, unspecified: Secondary | ICD-10-CM

## 2020-10-16 DIAGNOSIS — I10 Essential (primary) hypertension: Secondary | ICD-10-CM

## 2020-10-16 DIAGNOSIS — R0600 Dyspnea, unspecified: Secondary | ICD-10-CM

## 2020-10-16 DIAGNOSIS — R000 Tachycardia, unspecified: Secondary | ICD-10-CM

## 2020-10-16 DIAGNOSIS — Z8673 Personal history of transient ischemic attack (TIA), and cerebral infarction without residual deficits: Secondary | ICD-10-CM

## 2020-10-16 NOTE — Patient Instructions (Addendum)
Medication Instructions:  Continue all current medications.  Labwork: none  Testing/Procedures:  Your physician has recommended that you wear a 14 day event monitor. Event monitors are medical devices that record the heart's electrical activity. Doctors most often [place] these monitors to diagnose arrhythmias. Arrhythmias are problems with the speed or rhythm of the heartbeat. The monitor is a small, portable device. You can wear one while you do your normal daily activities. This is usually used to diagnose what is causing palpitations/syncope (passing out).  Office will contact with results via phone or letter.    Follow-Up: 6-8 weeks   Any Other Special Instructions Will Be Listed Below (If Applicable).  If you need a refill on your cardiac medications before your next appointment, please call your pharmacy.

## 2020-10-19 ENCOUNTER — Other Ambulatory Visit: Payer: Self-pay | Admitting: Cardiology

## 2020-10-19 ENCOUNTER — Ambulatory Visit: Payer: Self-pay

## 2020-10-19 DIAGNOSIS — R000 Tachycardia, unspecified: Secondary | ICD-10-CM

## 2020-11-25 NOTE — Progress Notes (Signed)
Cardiology Office Note  Date: 11/26/2020   ID: Tara Bright, DOB March 16, 1966, MRN [id number]  PCP:  Caryl Bis, MD  Cardiologist:  None Electrophysiologist:  None   Chief Complaint: Follow-up palpitations  History of Present Illness: Tara Bright is a 55 y.o. female with a history of CAD (NSTEMI September 2020 DES x2 to RCA, residual disease in LCx and left OM) recommended DAPT therapy x1 year then switched to clopidogrel monotherapy.  Last encounter with Dr. Bronson Ing 11/07/2019.  At night she was having right arm pain waking her up on a nightly basis.  Stated if she set up an chair her right leg would fall asleep after 30 minutes.  Stated her feet "turn white intermittently".  Recent visit to ED 02/04/2020. Triage notes stated she presented with c/o  SOB in the middle of the night. Woke at 9 am with SOB, abdominal pain with radiation to right arm and neck. No further documentation of visit noted.  Patient states he waited for quite some time and left without being seen due to the long wait..  At last visit she continued to complain of abdominal pain which seemed to radiate up into her chest with an increased sensation of fullness in her chest.  Stated when she ate she felt similar symptoms.  Also complained of recent increase in shortness of breath on exertion.  Relieved with rest.  Complained discomfort in her right arm and right leg increased when walking.  She has had previous ABI studies by Dr. Donnetta Hutching.  We discussed referral to GI specialist.  She wanted to to defer  due to not having insurance and lack of affordability.  She denies any GERD symptoms, or blood in stool or urine.  At last visit she had an episode an episode with rapid heart rate while she was on the treadmill at [employer].  She stated the treadmill registered her heart rate is 180.  She had some shortness of breath when this occurred.  She recently had pneumonia in the interim since l prior and a lingering cough.   A cardiac monitor was ordered  She is here for follow-up for recent cardiac monitor results.  She denies any significant palpitations in the interim since last visit.  Cardiac monitor did not show any significant arrhythmias.  See report below.  She was unable to wear the monitor for the full duration.  She continues to drink 2-3 Sun drop sodas per day which has a fair amount of caffeine content.  Otherwise she denies any stimulant intake or smoking.  Denies any anginal or exertional symptoms, lightheadedness, dizziness, presyncope or syncopal episodes.  Denies any CVA or TIA-like symptoms, current palpitations.    Past Medical History:  Diagnosis Date  . Abdominal aortic aneurysm (AAA) ([place])   . Abdominal pain   . Abnormal weight loss   . Ataxia 2016   and visual changes, left AMA before workup completed  . CAD (coronary atherosclerotic disease)   . Hypertension   . Kidney stone   . Loss of appetite   . Nicotine dependence   . Right upper quadrant pain   . UTI (urinary tract infection)     Past Surgical History:  Procedure Laterality Date  . CHOLECYSTECTOMY N/A 12/21/2018   Procedure: LAPAROSCOPIC CHOLECYSTECTOMY;  Surgeon: Virl Cagey, MD;  Location: [facility];  Service: General;  Laterality: N/A;  . CORONARY STENT INTERVENTION N/A 04/21/2019   Procedure: CORONARY STENT INTERVENTION;  Surgeon: Belva Crome, MD;  Location: [facility];  Service: Cardiovascular;  Laterality: N/A;  . CYSTOSCOPY W/ URETERAL STENT PLACEMENT Right 04/05/2017   Procedure: CYSTOSCOPY WITH RETROGRADE PYELOGRAM/URETERAL STENT PLACEMENT;  Surgeon: Lucas Mallow, MD;  Location: [facility];  Service: Urology;  Laterality: Right;  . CYSTOSCOPY/URETEROSCOPY/HOLMIUM LASER/STENT PLACEMENT Right 04/26/2017   Procedure: CYSTOSCOPY/URETEROSCOPY/HOLMIUM LASER/STENT PLACEMENT;  Surgeon: Lucas Mallow, MD;  Location: [facility];  Service: Urology;  Laterality: Right;  . LEFT HEART CATH AND CORONARY ANGIOGRAPHY  N/A 04/21/2019   Procedure: LEFT HEART CATH AND CORONARY ANGIOGRAPHY;  Surgeon: Belva Crome, MD;  Location: [facility];  Service: Cardiovascular;  Laterality: N/A;  . LITHOTRIPSY      Current Outpatient Medications  Medication Sig Dispense Refill  . albuterol (VENTOLIN HFA) 108 (90 Base) MCG/ACT inhaler Inhale 1 puff into the lungs as needed.    . Ascorbic Acid (VITAMIN C) 1000 MG tablet Take 1,000 mg by mouth daily.    Marland Kitchen aspirin EC 81 MG tablet Take 1 tablet (81 mg total) by mouth daily.    Marland Kitchen atorvastatin (LIPITOR) 80 MG tablet TAKE 1 TABET BY MOUTH EVERY DAY AT 6PM 90 tablet 2  . clopidogrel (PLAVIX) 75 MG tablet Take 1 tablet (75 mg total) by mouth daily. 90 tablet 0  . lisinopril (ZESTRIL) 10 MG tablet Take 1 tablet (10 mg total) by mouth daily. 90 tablet 0  . metoprolol tartrate (LOPRESSOR) 25 MG tablet Take 0.5 tablets (12.5 mg total) by mouth 2 (two) times daily. 90 tablet 2  . naphazoline-pheniramine (NAPHCON-A) 0.025-0.3 % ophthalmic solution Place 1 drop into both eyes 4 (four) times daily as needed for eye irritation.     . nitroGLYCERIN (NITROSTAT) 0.4 MG SL tablet Place 1 tablet (0.4 mg total) under the tongue every 5 (five) minutes x 3 doses as needed for chest pain. 25 tablet 2  . pyridOXINE (VITAMIN B-6) 100 MG tablet Take 100 mg by mouth daily.    Marland Kitchen VITAMIN D, ERGOCALCIFEROL, PO Take by mouth.    . zinc gluconate 50 MG tablet Take 50 mg by mouth daily.     No current facility-administered medications for this visit.   Allergies:  Other, Codeine, and Penicillins   Social History: The patient  reports that she quit smoking about 2 years ago. Her smoking use included cigarettes. She smoked 0.50 packs per day. She has never used smokeless tobacco. She reports that she does not drink alcohol and does not use drugs.   Family History: The patient's family history includes Alcohol abuse in her brother; Diabetes in her brother and father; Heart attack in her sister; Heart  failure in her father; Hypertension in her brother; Kidney failure in her brother; Stomach cancer in her mother.   ROS:  Please see the history of present illness. Otherwise, complete review of systems is positive for none.  All other systems are reviewed and negative.   Physical Exam: VS:  BP 112/62   Pulse 67   Ht 5\' 3"  (1.6 m)   Wt 122 lb (55.3 kg)   LMP 03/08/2017   SpO2 97%   BMI 21.61 kg/m , BMI Body mass index is 21.61 kg/m.  Wt Readings from Last 3 Encounters:  11/26/20 122 lb (55.3 kg)  10/16/20 128 lb 6.4 oz (58.2 kg)  04/06/20 130 lb (59 kg)    General: Patient appears comfortable at rest. Neck: Supple, no elevated JVP or carotid bruits, no thyromegaly. Lungs: Clear to auscultation, nonlabored breathing  at rest. Cardiac: Regular rate and rhythm, no S3 or significant systolic murmur, no pericardial rub. Extremities: No pitting edema, distal pulses 2+. Skin: Warm and dry. Musculoskeletal: No kyphosis. Neuropsychiatric: Alert and oriented x3, affect grossly appropriate.  ECG: EKG normal sinus rhythm rate of 63.  Recent Labwork: No results found for requested labs within last 8760 hours.     Component Value Date/Time   CHOL 156 04/21/2019 0338   TRIG 290 (H) 04/21/2019 0338   HDL 27 (L) 04/21/2019 0338   CHOLHDL 5.8 04/21/2019 0338   VLDL 58 (H) 04/21/2019 0338   LDLCALC 71 04/21/2019 0338    Other Studies Reviewed Today:  Zio monitor 09/29/2020 Study Highlights   Patch Wear Time:  4 days and 0 hours (2022-04-01T09:41:23-0400 to 2022-04-05T10:04:08-398)  Patient had a min HR of 48 bpm, max HR of 101 bpm, and avg HR of 62 bpm. Predominant underlying rhythm was Sinus Rhythm. Isolated SVEs were rare (<1.0%), SVE Couplets were rare (<1.0%), and no SVE Triplets were present. No Isolated VEs, VE Couplets, or  VE Triplets were present.     Vascular US AAA 03/03/2020 Abdominal Aorta: There is evidence of abnormal dilatation of the mid Abdominal aorta. The  largest aortic measurement is 3.3 cm. The largest aortic diameter remains essentially unchanged compared to prior exam. Previous diameter measurement was 3.2 cm.    Echocardiogram 03/03/2020 1. Left ventricular ejection fraction, by estimation, is 65 to 70%. The left ventricle has normal function. The left ventricle has no regional wall motion abnormalities. Left ventricular diastolic parameters were normal. 2. Right ventricular systolic function is normal. The right ventricular size is normal. There is normal pulmonary artery systolic pressure. 3. The mitral valve is normal in structure. Trivial mitral valve regurgitation. No evidence of mitral stenosis. 4. The aortic valve is tricuspid. Aortic valve regurgitation is not visualized. No aortic stenosis is present. 5. The inferior vena cava is normal in size with greater than 50% respiratory variability, suggesting right atrial pressure of 3 mmHg.   TTE: 04/22/19 IMPRESSIONS 1. Left ventricular ejection fraction, by visual estimation, is 65 to 70%. The left ventricle has normal function. Normal left ventricular size. There is no left ventricular hypertrophy. 2. Global right ventricle has normal systolic function.The right ventricular size is normal. No increase in right ventricular wall thickness. 3. Left atrial size was normal. 4. Right atrial size was normal. 5. The mitral valve is normal in structure. Trace mitral valve regurgitation. No evidence of mitral stenosis. 6. The tricuspid valve is normal in structure. Tricuspid valve regurgitation is trivial. 7. The aortic valve is normal in structure. Aortic valve regurgitation was not visualized by color flow Doppler. Structurally normal aortic valve, with no evidence of sclerosis or stenosis. 8. The pulmonic valve was normal in structure. Pulmonic valve regurgitation is not visualized by color flow Doppler. 9. TR signal is inadequate for assessing pulmonary artery systolic  pressure. 10. The inferior vena cava is normal in size with greater than 50% respiratory variability, suggesting right atrial pressure of 3 mmHg.  Cath: 04/21/19  Non-ST elevation myocardial infarction due to 99% mid RCA stenosis with TIMI flow II beyond the stenosis. The lesion appears to be thrombotic.  Successful ostial to proximal and mid to distal RCA stenting was performed, reducing all areas of obstruction to less than 20%. Final postdilatation balloon diameter and all segments stented with 3.75 mm (a 3.5 x 20 Synergy stent was placed proximally and a 38 x 3.0 stent placed in the mid  to distal). TIMI flow postintervention was normal.  Normal left main  30% mid LAD  60 to 70% mid circumflex, 60 to 70% second obtuse marginal, and 75% distal circumflex.  Low normal LV systolic function with EF 50% and inferobasal hypokinesis. LVEDP is normal.  RECOMMENDATIONS:   IV nitroglycerin has been discontinued.  IV fluid at 75 cc/h will be continued until the a.m.  Aggrastat will run for 2 hours and then be discontinued.  Aspirin and Brilinta should be continued for 12 months and then consider switching to clopidogrel monotherapy.  Aggressive risk factor modification with LDL cholesterol less than 55. We should also check an LP(a), hs-CRP.  Consider VASCEPA.  Assessment and Plan:   1. CAD in native artery Denies any current anginal or exertional symptoms.  Continue metoprolol 12.5 mg p.o. twice daily.  Continue nitroglycerin sublingual as needed.  Continue atorvastatin 80 mg daily.  Continue aspirin 81 mg and Plavix 75 mg daily.  States she has some mild twinge of chest pain below her left breast and rib cage without any radiation or associated symptoms.  2. Essential hypertension Blood pressure well controlled today at 112/62. Continue lisinopril 10 mg daily.  Continue metoprolol 12.5 mg p.o. twice daily.  3. AAA (abdominal aortic aneurysm) without rupture ([facility]) Most  recent abdominal aortic ultrasound showed aneurysm 3.3 cm versus previous study showing 3.2 cm February 2020. She has an upcoming study August 11 at 8 AM for recheck.  States sometimes she can feel the aneurysm.  4. History of TIA (transient ischemic attack) No focal neurologic deficits.   No new CVA or TIA-like symptoms.  5.  Dyspnea. No recent complaints of increased dyspnea.  She is going to the gym approximately 3 times a week to exercise without any significant anginal or exertional symptoms.  6.  Racing heart Recent cardiac monitor of 4 days duration showed no significant arrhythmias.  She denies any recurrent arrhythmias since last episode.  She continues to drink caffeinated soda.  Advised cutting down on the amount she drinks.  Medication Adjustments/Labs and Tests Ordered: Current medicines are reviewed at length with the patient today.  Concerns regarding medicines are outlined above.   Disposition: Follow-up with Dr. Harl Bowie or APP 6 months Signed, Levell July, NP 11/26/2020 11:35 AM    [facility] [address], [place], [place] [address] Phone: [phone]; Fax: [phone]

## 2020-11-26 ENCOUNTER — Encounter: Payer: Self-pay | Admitting: Family Medicine

## 2020-11-26 ENCOUNTER — Ambulatory Visit (INDEPENDENT_AMBULATORY_CARE_PROVIDER_SITE_OTHER): Payer: Self-pay | Admitting: Family Medicine

## 2020-11-26 VITALS — BP 112/62 | HR 67 | Ht 63.0 in | Wt 122.0 lb

## 2020-11-26 DIAGNOSIS — R000 Tachycardia, unspecified: Secondary | ICD-10-CM

## 2020-11-26 DIAGNOSIS — I10 Essential (primary) hypertension: Secondary | ICD-10-CM

## 2020-11-26 DIAGNOSIS — I2510 Atherosclerotic heart disease of native coronary artery without angina pectoris: Secondary | ICD-10-CM

## 2020-11-26 DIAGNOSIS — I714 Abdominal aortic aneurysm, without rupture, unspecified: Secondary | ICD-10-CM

## 2020-11-26 DIAGNOSIS — R0600 Dyspnea, unspecified: Secondary | ICD-10-CM

## 2020-11-26 NOTE — Patient Instructions (Signed)
Medication Instructions:  Continue all current medications.   Labwork: none  Testing/Procedures: none  Follow-Up: 6 months   Any Other Special Instructions Will Be Listed Below (If Applicable).   If you need a refill on your cardiac medications before your next appointment, please call your pharmacy.  

## 2020-12-05 ENCOUNTER — Other Ambulatory Visit: Payer: Self-pay | Admitting: Family Medicine

## 2021-01-10 ENCOUNTER — Other Ambulatory Visit: Payer: Self-pay | Admitting: Family Medicine

## 2021-02-03 ENCOUNTER — Telehealth: Payer: Self-pay | Admitting: Family Medicine

## 2021-02-03 NOTE — Telephone Encounter (Signed)
Verta Ellen., NP  Merlene Laughter, RN Caller: Unspecified (Today,  9:58 AM) I do not feel I can justify giving her a note to excuse her from jury duty based on "not being able to sit for long periods" . I will defer that to her PCP. Or if you want to go up the chain of command to either Dr Domenic Polite or DrBranch to see if they want to make that call you could do that.

## 2021-02-03 NOTE — Telephone Encounter (Signed)
Patient informed and verbalized understanding of plan. 

## 2021-02-03 NOTE — Telephone Encounter (Signed)
Pt is needing a letter to excuse her from jury duty- she's unable to sit for long periods of time.   Please call [phone]

## 2021-03-04 ENCOUNTER — Ambulatory Visit (INDEPENDENT_AMBULATORY_CARE_PROVIDER_SITE_OTHER): Payer: Self-pay

## 2021-03-04 DIAGNOSIS — I714 Abdominal aortic aneurysm, without rupture, unspecified: Secondary | ICD-10-CM

## 2021-03-12 ENCOUNTER — Telehealth: Payer: Self-pay | Admitting: *Deleted

## 2021-03-12 NOTE — Telephone Encounter (Signed)
Tara Blazer, LPN  [id number]  [id number] PM EDT Back to Top    Notified, copy to pcp.     Arnoldo Lenis, MD sent to Tara Blazer, LPN If no regular abdominal pains particularly with meals then would not need any further evaluation      Tara Blazer, LPN  [id number]  579FGE PM EDT     Notified, copy to pcp.    Patient is questioning if she needs further testing - AAA does say "cannot exclude mesenteric ischemia, vascular consult recommended".     Verta Ellen., NP  03/05/2021  1:39 PM EDT     Please call the patient and let her know the aortic aneurysm is stable.  When compared to previous study last year in August the dimensions of the aneurysm have actually decreased some.  Last year the aneurysm was measured at 3.3 cm x 3.3 cm.  On this recent study the dimensions have decreased at 3.1 cm x 3.1 cm.  Thanks   Verta Ellen, NP  03/05/2021 1:37 PM       Patient Communication  Append Comments   Seen Back to Top   Has seen [name] the last 4 visits, was a prev [facility] pt.  Written by Tara Blazer, LPN on [id number]  579FGE PM EDT Seen by patient Tara Bright on 03/12/2021 10:59 AM

## 2021-05-04 ENCOUNTER — Other Ambulatory Visit (HOSPITAL_COMMUNITY): Payer: Self-pay | Admitting: Family Medicine

## 2021-05-04 DIAGNOSIS — I7143 Infrarenal abdominal aortic aneurysm, without rupture: Secondary | ICD-10-CM

## 2021-05-26 ENCOUNTER — Ambulatory Visit: Payer: Self-pay | Admitting: Cardiology

## 2021-07-05 ENCOUNTER — Other Ambulatory Visit: Payer: Self-pay | Admitting: *Deleted

## 2021-07-05 NOTE — Telephone Encounter (Signed)
Received fax from [facility] - refill for Atorvastatin 80mg  daily.  Only seen by [name] recently - prev [facility] patient.

## 2021-07-06 ENCOUNTER — Encounter: Payer: Self-pay | Admitting: *Deleted

## 2021-07-06 ENCOUNTER — Other Ambulatory Visit: Payer: Self-pay | Admitting: *Deleted

## 2021-07-06 MED ORDER — LISINOPRIL 10 MG PO TABS: 10.0000 mg | ORAL_TABLET | Freq: Every day | ORAL | 1 refills | Status: DC

## 2021-07-06 MED ORDER — ATORVASTATIN CALCIUM 80 MG PO TABS: 80.0000 mg | ORAL_TABLET | Freq: Every day | ORAL | 3 refills | Status: DC

## 2021-07-07 ENCOUNTER — Other Ambulatory Visit: Payer: Self-pay | Admitting: *Deleted

## 2021-07-07 ENCOUNTER — Ambulatory Visit: Payer: Self-pay | Admitting: Cardiology

## 2021-07-07 DIAGNOSIS — I10 Essential (primary) hypertension: Secondary | ICD-10-CM

## 2021-07-07 DIAGNOSIS — E785 Hyperlipidemia, unspecified: Secondary | ICD-10-CM

## 2021-07-20 ENCOUNTER — Other Ambulatory Visit (HOSPITAL_COMMUNITY)
Admission: RE | Admit: 2021-07-20 | Discharge: 2021-07-20 | Disposition: A | Discharge status: Home / Self Care | Payer: Self-pay | Source: Ambulatory Visit | Attending: Student | Admitting: Student

## 2021-07-20 ENCOUNTER — Other Ambulatory Visit: Payer: Self-pay

## 2021-07-20 DIAGNOSIS — E785 Hyperlipidemia, unspecified: Secondary | ICD-10-CM | POA: Insufficient documentation

## 2021-07-20 DIAGNOSIS — I10 Essential (primary) hypertension: Secondary | ICD-10-CM | POA: Insufficient documentation

## 2021-07-20 LAB — COMPREHENSIVE METABOLIC PANEL
ALT: 30 U/L (ref 0–44)
AST: 24 U/L (ref 15–41)
Albumin: 4.7 g/dL (ref 3.5–5.0)
Alkaline Phosphatase: 62 U/L (ref 38–126)
Anion gap: 8 (ref 5–15)
BUN: 22 mg/dL — ABNORMAL HIGH (ref 6–20)
CO2: 24 mmol/L (ref 22–32)
Calcium: 9.5 mg/dL (ref 8.9–10.3)
Chloride: 106 mmol/L (ref 98–111)
Creatinine, Ser: 0.93 mg/dL (ref 0.44–1.00)
GFR, Estimated: >60 mL/min (ref >60)
Glucose, Bld: 103 mg/dL — ABNORMAL HIGH (ref 70–99)
Potassium: 4.3 mmol/L (ref 3.5–5.1)
Sodium: 138 mmol/L (ref 135–145)
Total Bilirubin: 0.5 mg/dL (ref 0.3–1.2)
Total Protein: 7.5 g/dL (ref 6.5–8.1)

## 2021-07-20 LAB — LIPID PANEL
Cholesterol: 111 mg/dL (ref 0–200)
HDL: 28 mg/dL — ABNORMAL LOW (ref >40)
LDL Cholesterol: 44 mg/dL (ref 0–99)
Total CHOL/HDL Ratio: 4 RATIO
Triglycerides: 193 mg/dL — ABNORMAL HIGH (ref <150)
VLDL: 39 mg/dL (ref 0–40)

## 2021-07-21 ENCOUNTER — Encounter: Payer: Self-pay | Admitting: *Deleted

## 2021-08-10 ENCOUNTER — Other Ambulatory Visit: Payer: Self-pay | Admitting: *Deleted

## 2021-08-10 MED ORDER — CLOPIDOGREL BISULFATE 75 MG PO TABS: 75.0000 mg | ORAL_TABLET | Freq: Every day | ORAL | 6 refills | Status: DC

## 2021-09-08 ENCOUNTER — Other Ambulatory Visit: Payer: Self-pay

## 2021-09-08 MED ORDER — METOPROLOL TARTRATE 25 MG PO TABS: 12.5000 mg | ORAL_TABLET | Freq: Two times a day (BID) | ORAL | 2 refills | Status: DC

## 2021-09-29 ENCOUNTER — Encounter: Payer: Self-pay | Admitting: Cardiology

## 2021-09-29 ENCOUNTER — Ambulatory Visit (INDEPENDENT_AMBULATORY_CARE_PROVIDER_SITE_OTHER): Payer: Self-pay | Admitting: Cardiology

## 2021-09-29 ENCOUNTER — Encounter (INDEPENDENT_AMBULATORY_CARE_PROVIDER_SITE_OTHER): Payer: Self-pay | Admitting: *Deleted

## 2021-09-29 VITALS — BP 118/80 | HR 72 | Ht 63.0 in | Wt 120.4 lb

## 2021-09-29 DIAGNOSIS — I10 Essential (primary) hypertension: Secondary | ICD-10-CM

## 2021-09-29 DIAGNOSIS — E782 Mixed hyperlipidemia: Secondary | ICD-10-CM

## 2021-09-29 DIAGNOSIS — I2510 Atherosclerotic heart disease of native coronary artery without angina pectoris: Secondary | ICD-10-CM

## 2021-09-29 DIAGNOSIS — R109 Unspecified abdominal pain: Secondary | ICD-10-CM

## 2021-09-29 MED ORDER — AMLODIPINE BESYLATE 2.5 MG PO TABS: 2.5000 mg | ORAL_TABLET | Freq: Every day | ORAL | 1 refills | Status: DC

## 2021-09-29 NOTE — Patient Instructions (Addendum)
Medication Instructions:  ?Your physician has recommended you make the following change in your medication:  ?Stop Aspirin ?Start Norvasc (Amlodipine) 2.5 mg once a day ?Continue other medications as directed  ? ?Labwork: ?none ? ?Testing/Procedures: ?none ? ?Follow-Up: ?Your physician recommends that you schedule a follow-up appointment in: 6 months ? ?Any Other Special Instructions Will Be Listed Below (If Applicable). You have been referred to Gastroenterology in [place].  ? ?If you need a refill on your cardiac medications before your next appointment, please call your pharmacy. ? ?

## 2021-09-29 NOTE — Progress Notes (Signed)
Clinical Summary Ms. Sen is a 56 y.o.female former patient of Dr Bronson Ing, this is our first visit together.   1.CAD - NSTEMI 03/2019, DES x 2 to RCA - recs at time of cath were DAPT x 1 year then plavix monotherapy due to some residual disease - 02/2020 echo LVEF 65-70% - appears still taking ASA and plavix.   - epigastric and LUQ pain, tender to palpations or position. - can last several hours.      2.HTN - compliant with meds   3. Hyperlipidemia - compliant with meds  4. AAA - 3.2 cm by [place] in 08/2018 - followed by vascular - 02/2021 [place]: 3.3cm AAA, needs repeat 1 year Elevated velocities in the SMA and IMA, turbulent flow in the celiac axis  - cannot exclude mesenteric ischemia.  5. History of TIA  6. Palpitations 10/2020 monitor: rare ectopy  7. Cold extremities - hands and feet very sensitive to cold temperatures, can have mild discoloration and discomfort.   Past Medical History:  Diagnosis Date   Abdominal aortic aneurysm (AAA) (HCC)    Abdominal pain    Abnormal weight loss    Ataxia 2016   and visual changes, left AMA before workup completed   CAD (coronary atherosclerotic disease)    Hypertension    Kidney stone    Loss of appetite    Nicotine dependence    Right upper quadrant pain    UTI (urinary tract infection)      Allergies  Allergen Reactions   Other Nausea And Vomiting and Other (See Comments)    Just about all pain medications cause Nausea and Vomiting. Must take Phenergan beforehand   Codeine Nausea Only and Other (See Comments)    Gi upset   Penicillins Hives and Nausea And Vomiting    Did it involve swelling of the face/tongue/throat, SOB, or low BP? No Did it involve sudden or severe rash/hives, skin peeling, or any reaction on the inside of your mouth or nose? Yes Did you need to seek medical attention at a hospital or doctor's office? Yes When did it last happen? Childhood If all above answers are "NO", may proceed with  cephalosporin use.      Current Outpatient Medications  Medication Sig Dispense Refill   albuterol (VENTOLIN HFA) 108 (90 Base) MCG/ACT inhaler Inhale 1 puff into the lungs as needed.     Ascorbic Acid (VITAMIN C) 1000 MG tablet Take 1,000 mg by mouth daily.     aspirin EC 81 MG tablet Take 1 tablet (81 mg total) by mouth daily.     atorvastatin (LIPITOR) 80 MG tablet Take 1 tablet (80 mg total) by mouth daily. 90 tablet 3   clopidogrel (PLAVIX) 75 MG tablet Take 1 tablet (75 mg total) by mouth daily. 30 tablet 6   lisinopril (ZESTRIL) 10 MG tablet Take 1 tablet (10 mg total) by mouth daily. 90 tablet 1   metoprolol tartrate (LOPRESSOR) 25 MG tablet Take 0.5 tablets (12.5 mg total) by mouth 2 (two) times daily. 90 tablet 2   naphazoline-pheniramine (NAPHCON-A) 0.025-0.3 % ophthalmic solution Place 1 drop into both eyes 4 (four) times daily as needed for eye irritation.      nitroGLYCERIN (NITROSTAT) 0.4 MG SL tablet Place 1 tablet (0.4 mg total) under the tongue every 5 (five) minutes x 3 doses as needed for chest pain. 25 tablet 2   pyridOXINE (VITAMIN B-6) 100 MG tablet Take 100 mg by mouth daily.  VITAMIN D, ERGOCALCIFEROL, PO Take by mouth.     zinc gluconate 50 MG tablet Take 50 mg by mouth daily.     No current facility-administered medications for this visit.     Past Surgical History:  Procedure Laterality Date   CHOLECYSTECTOMY N/A 12/21/2018   Procedure: LAPAROSCOPIC CHOLECYSTECTOMY;  Surgeon: Virl Cagey, MD;  Location: [facility];  Service: General;  Laterality: N/A;   CORONARY STENT INTERVENTION N/A 04/21/2019   Procedure: CORONARY STENT INTERVENTION;  Surgeon: Belva Crome, MD;  Location: [facility];  Service: Cardiovascular;  Laterality: N/A;   CYSTOSCOPY W/ URETERAL STENT PLACEMENT Right 04/05/2017   Procedure: CYSTOSCOPY WITH RETROGRADE PYELOGRAM/URETERAL STENT PLACEMENT;  Surgeon: Lucas Mallow, MD;  Location: [facility];  Service: Urology;  Laterality:  Right;   CYSTOSCOPY/URETEROSCOPY/HOLMIUM LASER/STENT PLACEMENT Right 04/26/2017   Procedure: CYSTOSCOPY/URETEROSCOPY/HOLMIUM LASER/STENT PLACEMENT;  Surgeon: Lucas Mallow, MD;  Location: [facility];  Service: Urology;  Laterality: Right;   LEFT HEART CATH AND CORONARY ANGIOGRAPHY N/A 04/21/2019   Procedure: LEFT HEART CATH AND CORONARY ANGIOGRAPHY;  Surgeon: Belva Crome, MD;  Location: [facility];  Service: Cardiovascular;  Laterality: N/A;   LITHOTRIPSY       Allergies  Allergen Reactions   Other Nausea And Vomiting and Other (See Comments)    Just about all pain medications cause Nausea and Vomiting. Must take Phenergan beforehand   Codeine Nausea Only and Other (See Comments)    Gi upset   Penicillins Hives and Nausea And Vomiting    Did it involve swelling of the face/tongue/throat, SOB, or low BP? No Did it involve sudden or severe rash/hives, skin peeling, or any reaction on the inside of your mouth or nose? Yes Did you need to seek medical attention at a hospital or doctor's office? Yes When did it last happen? Childhood If all above answers are "NO", may proceed with cephalosporin use.       Family History  Problem Relation Age of Onset   Stomach cancer Mother    Diabetes Father    Heart failure Father    Heart attack Sister    Hypertension Brother    Alcohol abuse Brother    Diabetes Brother    Kidney failure Brother      Social History Ms. Hundal reports that she quit smoking about 2 years ago. Her smoking use included cigarettes. She smoked an average of .5 packs per day. She has never used smokeless tobacco. Ms. Kromer reports no history of alcohol use.   Review of Systems CONSTITUTIONAL: No weight loss, fever, chills, weakness or fatigue.  HEENT: Eyes: No visual loss, blurred vision, double vision or yellow sclerae.No hearing loss, sneezing, congestion, runny nose or sore throat.  SKIN: No rash or itching.  CARDIOVASCULAR: per hpi RESPIRATORY:  No shortness of breath, cough or sputum.  GASTROINTESTINAL: No anorexia, nausea, vomiting or diarrhea. No abdominal pain or blood.  GENITOURINARY: No burning on urination, no polyuria NEUROLOGICAL: No headache, dizziness, syncope, paralysis, ataxia, numbness or tingling in the extremities. No change in bowel or bladder control.  MUSCULOSKELETAL: No muscle, back pain, joint pain or stiffness.  LYMPHATICS: No enlarged nodes. No history of splenectomy.  PSYCHIATRIC: No history of depression or anxiety.  ENDOCRINOLOGIC: No reports of sweating, cold or heat intolerance. No polyuria or polydipsia.  Marland Kitchen   Physical Examination Today's Vitals   09/29/21 1010  BP: 118/80  Pulse: 72  SpO2: 96%  Weight: 120 lb 6.4 oz (  54.6 kg)  Height: '5\' 3"'$  (1.6 m)   Body mass index is 21.33 kg/m.  Gen: resting comfortably, no acute distress HEENT: no scleral icterus, pupils equal round and reactive, no palptable cervical adenopathy,  CV: RRR, no m/r/ gno jvd Resp: Clear to auscultation bilaterally GI: abdomen is soft, non-tender, non-distended, normal bowel sounds, no hepatosplenomegaly MSK: extremities are warm, no edema.  Skin: warm, no rash Neuro:  no focal deficits Psych: appropriate affect   Diagnostic Studies  TTE: 04/22/19   IMPRESSIONS      1. Left ventricular ejection fraction, by visual estimation, is 65 to 70%. The left ventricle has normal function. Normal left ventricular size. There is no left ventricular hypertrophy.  2. Global right ventricle has normal systolic function.The right ventricular size is normal. No increase in right ventricular wall thickness.  3. Left atrial size was normal.  4. Right atrial size was normal.  5. The mitral valve is normal in structure. Trace mitral valve regurgitation. No evidence of mitral stenosis.  6. The tricuspid valve is normal in structure. Tricuspid valve regurgitation is trivial.  7. The aortic valve is normal in structure. Aortic valve  regurgitation was not visualized by color flow Doppler. Structurally normal aortic valve, with no evidence of sclerosis or stenosis.  8. The pulmonic valve was normal in structure. Pulmonic valve regurgitation is not visualized by color flow Doppler.  9. TR signal is inadequate for assessing pulmonary artery systolic pressure. 10. The inferior vena cava is normal in size with greater than 50% respiratory variability, suggesting right atrial pressure of 3 mmHg.  _____________ Cath: 04/21/19   Non-ST elevation myocardial infarction due to 99% mid RCA stenosis with TIMI flow II beyond the stenosis.  The lesion appears to be thrombotic. Successful ostial to proximal and mid to distal RCA stenting was performed, reducing all areas of obstruction to less than 20%.  Final postdilatation balloon diameter and all segments stented with 3.75 mm (a 3.5 x 20 Synergy stent was placed proximally and a 38 x 3.0 stent placed in the mid to distal).  TIMI flow postintervention was normal. Normal left main 30% mid LAD 60 to 70% mid circumflex, 60 to 70% second obtuse marginal, and 75% distal circumflex. Low normal LV systolic function with EF 50% and inferobasal hypokinesis.  LVEDP is normal.   RECOMMENDATIONS:   IV nitroglycerin has been discontinued. IV fluid at 75 cc/h will be continued until the a.m. Aggrastat will run for 2 hours and then be discontinued. Aspirin and Brilinta should be continued for 12 months and then consider switching to clopidogrel monotherapy. Aggressive risk factor modification with LDL cholesterol less than 55.  We should also check an LP(a), hs-CRP. Consider VASCEPA.   02/2020 echo  1. Left ventricular ejection fraction, by estimation, is 65 to 70%. The  left ventricle has normal function. The left ventricle has no regional  wall motion abnormalities. Left ventricular diastolic parameters were  normal.   2. Right ventricular systolic function is normal. The right ventricular   size is normal. There is normal pulmonary artery systolic pressure.   3. The mitral valve is normal in structure. Trivial mitral valve  regurgitation. No evidence of mitral stenosis.   4. The aortic valve is tricuspid. Aortic valve regurgitation is not  visualized. No aortic stenosis is present.   5. The inferior vena cava is normal in size with greater than 50%  respiratory variability, suggesting right atrial pressure of 3 mmHg.   Assessment and Plan  1.CAD - no cardiac symptoms - can stop ASA, continue plavix '75mg'$  daily. Plan has been plavix monotherapy based on prior notes - EKG SR, no acute ischemic changes  2. HTN - at goal, continue current meds  3.Hyperlipidemia - request pcp labs, continue statin  4. Abdominal pain - refer to GI - prior AAA [place] did show some elevated velocities in SMA and IMA, unlcear if related.   5. Raynaud like symptoms - cold/painful extremities at times, sensitive to temperature. Start norvasc 2.'5mg'$  daily      Arnoldo Lenis, M.D.

## 2021-12-07 ENCOUNTER — Ambulatory Visit (HOSPITAL_COMMUNITY)
Admission: RE | Admit: 2021-12-07 | Discharge: 2021-12-07 | Disposition: A | Discharge status: Home / Self Care | Payer: Self-pay | Source: Ambulatory Visit | Attending: Gastroenterology | Admitting: Gastroenterology

## 2021-12-07 ENCOUNTER — Ambulatory Visit (INDEPENDENT_AMBULATORY_CARE_PROVIDER_SITE_OTHER): Payer: Self-pay | Admitting: Gastroenterology

## 2021-12-07 ENCOUNTER — Encounter (INDEPENDENT_AMBULATORY_CARE_PROVIDER_SITE_OTHER): Payer: Self-pay | Admitting: Gastroenterology

## 2021-12-07 VITALS — BP 161/79 | HR 73 | Temp 98.3°F | Ht 62.0 in | Wt 121.9 lb

## 2021-12-07 DIAGNOSIS — R195 Other fecal abnormalities: Secondary | ICD-10-CM

## 2021-12-07 DIAGNOSIS — R1084 Generalized abdominal pain: Secondary | ICD-10-CM

## 2021-12-07 DIAGNOSIS — K5900 Constipation, unspecified: Secondary | ICD-10-CM

## 2021-12-07 DIAGNOSIS — R1030 Lower abdominal pain, unspecified: Secondary | ICD-10-CM | POA: Insufficient documentation

## 2021-12-07 DIAGNOSIS — R1310 Dysphagia, unspecified: Secondary | ICD-10-CM

## 2021-12-07 DIAGNOSIS — R6881 Early satiety: Secondary | ICD-10-CM

## 2021-12-07 MED ORDER — IOHEXOL 350 MG/ML SOLN
100.0000 mL | Freq: Once | INTRAVENOUS | Status: AC | PRN
  Administered 2021-12-07: 100 mL via INTRAVENOUS

## 2021-12-07 NOTE — Progress Notes (Signed)
? ?Referring Provider: Arnoldo Lenis, MD ?Primary Care Physician:  Caryl Bis, MD ?Primary GI Physician: New ? ?Chief Complaint  ?Patient presents with  ? Abdominal Pain  ?  Patient here today due to abdominal pain which she states is pretty much all over the stomach. She states she has had a doppler which showed blockage in veins to small and large intestines. Also has a fatty liver, and had gallbladder removed two years ago.  ? ?HPI:   ?Tara Bright is a 56 y.o. female with past medical history of AAA, CAD, HTN, TIA ? ?Patient presenting today as a new patient for abdominal pain.  ? ?Patient states that she has been having abdominal pain for over 2 years. She states that she had her gallbladder removed as this was suspect the etiology of her pain, however, pain continued. She reports that she had.  ? ?Having epigastric pain, bloating and early satiety, constipation, as well as lower abdominal pain, worsened by eating. States that she had a doppler study in august that showed some blockage in veins to small and large intestine, question of mesenteric ischemia. Study was ordered by cardiologist Dr. Harl Bowie, states she inquired if she needed further evaluation of this but was told no. She has previously followed with Dr. Donnetta Hutching with vascular, though has not seen them recently. She states that she can go up to 9-10 days without a BM. She will take dulcolax and use enemas and will finally have a large, watery BM. Denies rectal bleeding or melena. States that weight is up and down though no significant amount of weight loss. 121 lbs today and 122 lbs May 2022. appetite is not great, is trying to eat bland foods to help avoid post prandial abdominal pain and nausea that she is experiencing. She denies heartburn or acid reflux. She does have issues on occasion with dysphagia, this can occur with most anything and happens maybe once per week. She has no episodes of needing to cough foods back up. She has a lot of  bloating and feels that she is full and cannot eat much. She states that she has a lot of pressure in her abdomen, often even feels that she has to lean back to get some comfort.  ? ?NSAID [id number]  ?Social hx:no etoh or tobacco  ?Fam [id number] died of stomach cancer at age 5 ? ?Last Colonoscopy:never ?Last Endoscopy:never ? ?Recommendations:  ? ?Past Medical History:  ?Diagnosis Date  ? Abdominal aortic aneurysm (AAA) ([facility])   ? Abdominal pain   ? Abnormal weight loss   ? Ataxia 2016  ? and visual changes, left AMA before workup completed  ? CAD (coronary atherosclerotic disease)   ? Hypertension   ? Kidney stone   ? Loss of appetite   ? Nicotine dependence   ? Right upper quadrant pain   ? UTI (urinary tract infection)   ? ? ?Past Surgical History:  ?Procedure Laterality Date  ? CHOLECYSTECTOMY N/A 12/21/2018  ? Procedure: LAPAROSCOPIC CHOLECYSTECTOMY;  Surgeon: Virl Cagey, MD;  Location: [facility];  Service: General;  Laterality: N/A;  ? CORONARY STENT INTERVENTION N/A 04/21/2019  ? Procedure: CORONARY STENT INTERVENTION;  Surgeon: Belva Crome, MD;  Location: [facility];  Service: Cardiovascular;  Laterality: N/A;  ? CYSTOSCOPY W/ URETERAL STENT PLACEMENT Right 04/05/2017  ? Procedure: CYSTOSCOPY WITH RETROGRADE PYELOGRAM/URETERAL STENT PLACEMENT;  Surgeon: Lucas Mallow, MD;  Location: [facility];  Service: Urology;  Laterality: Right;  ?  CYSTOSCOPY/URETEROSCOPY/HOLMIUM LASER/STENT PLACEMENT Right 04/26/2017  ? Procedure: CYSTOSCOPY/URETEROSCOPY/HOLMIUM LASER/STENT PLACEMENT;  Surgeon: Lucas Mallow, MD;  Location: [facility];  Service: Urology;  Laterality: Right;  ? LEFT HEART CATH AND CORONARY ANGIOGRAPHY N/A 04/21/2019  ? Procedure: LEFT HEART CATH AND CORONARY ANGIOGRAPHY;  Surgeon: Belva Crome, MD;  Location: [facility];  Service: Cardiovascular;  Laterality: N/A;  ? LITHOTRIPSY    ? ? ?Current Outpatient Medications  ?Medication Sig Dispense Refill  ? albuterol (VENTOLIN HFA)  108 (90 Base) MCG/ACT inhaler Inhale 1 puff into the lungs as needed.    ? amLODipine (NORVASC) 2.5 MG tablet Take 1 tablet (2.5 mg total) by mouth daily. 90 tablet 1  ? Ascorbic Acid (VITAMIN C) 1000 MG tablet Take 1,000 mg by mouth daily.    ? atorvastatin (LIPITOR) 80 MG tablet Take 1 tablet (80 mg total) by mouth daily. (Patient taking differently: Take 40 mg by mouth daily.) 90 tablet 3  ? bisacodyl (DULCOLAX) 5 MG EC tablet Take 5 mg by mouth daily as needed for moderate constipation.    ? clopidogrel (PLAVIX) 75 MG tablet Take 1 tablet (75 mg total) by mouth daily. 30 tablet 6  ? lisinopril (ZESTRIL) 10 MG tablet Take 1 tablet (10 mg total) by mouth daily. 90 tablet 1  ? metoprolol tartrate (LOPRESSOR) 25 MG tablet Take 0.5 tablets (12.5 mg total) by mouth 2 (two) times daily. 90 tablet 2  ? naphazoline-pheniramine (NAPHCON-A) 0.025-0.3 % ophthalmic solution Place 1 drop into both eyes 4 (four) times daily as needed for eye irritation.     ? nitroGLYCERIN (NITROSTAT) 0.4 MG SL tablet Place 1 tablet (0.4 mg total) under the tongue every 5 (five) minutes x 3 doses as needed for chest pain. 25 tablet 2  ? pyridOXINE (VITAMIN B-6) 50 MG tablet Take 50 mg by mouth daily.    ? VITAMIN D, ERGOCALCIFEROL, PO Take by mouth.    ? zinc gluconate 50 MG tablet Take 50 mg by mouth daily.    ? ?No current facility-administered medications for this visit.  ? ? ?Allergies as of 12/07/2021 - Review Complete 12/07/2021  ?Allergen Reaction Noted  ? Other Nausea And Vomiting and Other (See Comments) 04/03/2012  ? Codeine Nausea Only and Other (See Comments) 04/03/2012  ? Penicillins Hives and Nausea And Vomiting 09/21/2014  ? ? ?Family History  ?Problem Relation Age of Onset  ? Stomach cancer Mother   ? Diabetes Father   ? Heart failure Father   ? Heart attack Sister   ? Hypertension Brother   ? Alcohol abuse Brother   ? Diabetes Brother   ? Kidney failure Brother   ? ? ?Social History  ? ?Socioeconomic History  ? Marital status:  Legally Separated  ?  Spouse name: Not on file  ? Number of children: Not on file  ? Years of education: Not on file  ? Highest education level: Not on file  ?Occupational History  ? Not on file  ?Tobacco Use  ? Smoking status: Former  ?  Packs/day: 0.50  ?  Types: Cigarettes  ?  Quit date: 10/04/2018  ?  Years since quitting: 3.1  ? Smokeless tobacco: Never  ?Vaping Use  ? Vaping Use: Never used  ?Substance and Sexual Activity  ? Alcohol use: No  ? Drug use: No  ? Sexual activity: Yes  ?  Birth control/protection: None  ?Other Topics Concern  ? Not on file  ?Social History Narrative  ?  Not on file  ? ?Social Determinants of Health  ? ?Financial Resource Strain: Not on file  ?Food Insecurity: Not on file  ?Transportation Needs: Not on file  ?Physical Activity: Not on file  ?Stress: Not on file  ?Social Connections: Not on file  ? ?Review of systems ?General: negative for malaise, night sweats, fever, chills, weight loss ?Neck: Negative for lumps, goiter, pain and significant neck swelling ?Resp: Negative for cough, wheezing, dyspnea at rest ?CV: Negative for chest pain, leg swelling, palpitations, orthopnea ?GI: denies melena, hematochezia, odynophagia, or unintentional weight loss. +early satiety +lower abdominal pain +post prandial abdominal pain +epigastric pain +nausea +constipation ?MSK: Negative for joint pain or swelling, back pain, and muscle pain. ?Derm: Negative for itching or rash ?Psych: Denies depression, anxiety, memory loss, confusion. No homicidal or suicidal ideation.  ?Heme: Negative for prolonged bleeding, bruising easily, and swollen nodes. ?Endocrine: Negative for cold or heat intolerance, polyuria, polydipsia and goiter. ?Neuro: negative for tremor, gait imbalance, syncope and seizures. ?The remainder of the review of systems is noncontributory. ? ?Physical Exam: ?BP (!) 161/79 (BP Location: Left Arm, Patient Position: Sitting, Cuff Size: Small)   Pulse 73   Temp 98.3 ?F (36.8 ?C) (Oral)   Ht  '5\' 2"'$  (1.575 m)   Wt 121 lb 14.4 oz (55.3 kg)   LMP 03/08/2017   BMI 22.30 kg/m?  ?General:   Alert and oriented. No distress noted. Pleasant and cooperative.  ?Head:  Normocephalic and atraumatic. ?E

## 2021-12-07 NOTE — Patient Instructions (Addendum)
We will get you scheduled for CT angio of your abdomen/pelvis to rule out mesenteric ischemia ?Will get you scheduled for upper endoscopy after CT, and possibly may need to proceed with colonoscopy as well. I will be in touch once I have CT results. ? ?As far as constipation, Start taking Miralax 1 capful every day for one week. If bowel movements do not improve, increase to 1 capful every 12 hours. If after two weeks there is no improvement, increase to 1 capful every 8 hours. Can continue doing fiber pills and align as well. Make sure you are staying well hydrated.  ? ? ?

## 2021-12-08 ENCOUNTER — Encounter (INDEPENDENT_AMBULATORY_CARE_PROVIDER_SITE_OTHER): Payer: Self-pay | Admitting: Gastroenterology

## 2021-12-08 DIAGNOSIS — R6881 Early satiety: Secondary | ICD-10-CM | POA: Insufficient documentation

## 2021-12-08 DIAGNOSIS — R1084 Generalized abdominal pain: Secondary | ICD-10-CM | POA: Insufficient documentation

## 2021-12-08 DIAGNOSIS — R1030 Lower abdominal pain, unspecified: Secondary | ICD-10-CM | POA: Insufficient documentation

## 2021-12-08 DIAGNOSIS — R1310 Dysphagia, unspecified: Secondary | ICD-10-CM | POA: Insufficient documentation

## 2021-12-08 DIAGNOSIS — K5900 Constipation, unspecified: Secondary | ICD-10-CM | POA: Insufficient documentation

## 2021-12-09 ENCOUNTER — Encounter (INDEPENDENT_AMBULATORY_CARE_PROVIDER_SITE_OTHER): Payer: Self-pay

## 2021-12-09 ENCOUNTER — Other Ambulatory Visit (INDEPENDENT_AMBULATORY_CARE_PROVIDER_SITE_OTHER): Payer: Self-pay

## 2021-12-09 ENCOUNTER — Telehealth: Payer: Self-pay

## 2021-12-09 ENCOUNTER — Telehealth (INDEPENDENT_AMBULATORY_CARE_PROVIDER_SITE_OTHER): Payer: Self-pay

## 2021-12-09 DIAGNOSIS — R6881 Early satiety: Secondary | ICD-10-CM

## 2021-12-09 DIAGNOSIS — G8929 Other chronic pain: Secondary | ICD-10-CM

## 2021-12-09 DIAGNOSIS — R1030 Lower abdominal pain, unspecified: Secondary | ICD-10-CM

## 2021-12-09 MED ORDER — PEG 3350-KCL-NA BICARB-NACL 420 G PO SOLR: 4000.0000 mL | ORAL | 0 refills | Status: DC

## 2021-12-09 NOTE — Telephone Encounter (Signed)
   Pre-operative Risk Assessment    Patient Name: Kandise Riehle  DOB: Colbe Viviano 25, 1967 MRN: [id number]      Request for Surgical Clearance    Procedure:   Colonoscopy - Upper Endoscopy  Date of Surgery:  Clearance 01/14/22                                 Surgeon:  Dr. Maylon Peppers Surgeon's Group or Practice Name:  [facility] Phone number:  [phone] Fax number:  [phone]   Type of Clearance Requested:   - Medical  - Pharmacy:  Hold Clopidogrel (Plavix) x 5 days   Type of Anesthesia:  MAC   Additional requests/questions:   None  Signed, Larry Alcock   12/09/2021, 10:40 AM

## 2021-12-09 NOTE — Telephone Encounter (Signed)
Tara Bright Ann Wealthy Danielski, CMA  ?

## 2021-12-10 NOTE — Telephone Encounter (Signed)
Ok to hold plavix as needed   Zandra Abts MD

## 2021-12-10 NOTE — Telephone Encounter (Signed)
    Name: Tara Bright  DOB: 1966/02/04  MRN: [id number]  Primary Cardiologist: Carlyle Dolly, MD   Preoperative team, please contact this patient and set up a phone call appointment for further preoperative risk assessment. Please obtain consent and complete medication review. Thank you for your help.  I confirm that guidance regarding antiplatelet and oral anticoagulation therapy has been completed and, if necessary, noted below.    Charlie Pitter, PA-C 12/10/2021, 1:59 PM [facility] [address] [place], [place] [address]

## 2021-12-10 NOTE — Telephone Encounter (Signed)
   Patient Name: Tara Bright  DOB: Dec 16, 1965 MRN: [id number]  Primary Cardiologist: Carlyle Dolly, MD  Chart reviewed as part of pre-operative protocol coverage. Last OV 09/29/21, h/o CAD with NSTEMI 03/2019 s/p DESx2 to RCA, HTN, 3.3cm AAA, hx TIA, rare ectopy. Now on Plavix monotherapy per notes. Will route to Dr. Harl Bowie if he is OK with pt holding Plavix for 5 days prior to colonoscopy/upper endoscopy as requested then we will plan to perform a call to patient to ensure no interim cardiac sx from last OV. Dr. Harl Bowie - Please route response to P [facility] DIV PREOP (the pre-op pool). Thank you.    Charlie Pitter, PA-C 12/10/2021, 9:55 AM

## 2021-12-10 NOTE — Telephone Encounter (Signed)
Left a message to call back for tele pre op appt 

## 2021-12-13 ENCOUNTER — Telehealth: Payer: Self-pay | Admitting: *Deleted

## 2021-12-13 NOTE — Telephone Encounter (Signed)
Pt agreeable to plan of care and has been scheduled for a tele pre op appt 12/27/21 @ 10 am. Med rec and consent have been done.    Patient Consent for Virtual Visit        Tara Bright has provided verbal consent on 12/13/2021 for a virtual visit (video or telephone).   CONSENT FOR VIRTUAL VISIT FOR:  Tara Bright  By participating in this virtual visit I agree to the following:  I hereby voluntarily request, consent and authorize [facility] and its employed or contracted physicians, physician assistants, nurse practitioners or other licensed health care professionals (the Practitioner), to provide me with telemedicine health care services (the "Services") as deemed necessary by the treating Practitioner. I acknowledge and consent to receive the Services by the Practitioner via telemedicine. I understand that the telemedicine visit will involve communicating with the Practitioner through live audiovisual communication technology and the disclosure of certain medical information by electronic transmission. I acknowledge that I have been given the opportunity to request an in-person assessment or other available alternative prior to the telemedicine visit and am voluntarily participating in the telemedicine visit.  I understand that I have the right to withhold or withdraw my consent to the use of telemedicine in the course of my care at any time, without affecting my right to future care or treatment, and that the Practitioner or I may terminate the telemedicine visit at any time. I understand that I have the right to inspect all information obtained and/or recorded in the course of the telemedicine visit and may receive copies of available information for a reasonable fee.  I understand that some of the potential risks of receiving the Services via telemedicine include:  Delay or interruption in medical evaluation due to technological equipment failure or disruption; Information transmitted may  not be sufficient (e.g. poor resolution of images) to allow for appropriate medical decision making by the Practitioner; and/or  In rare instances, security protocols could fail, causing a breach of personal health information.  Furthermore, I acknowledge that it is my responsibility to provide information about my medical history, conditions and care that is complete and accurate to the best of my ability. I acknowledge that Practitioner's advice, recommendations, and/or decision may be based on factors not within their control, such as incomplete or inaccurate data provided by me or distortions of diagnostic images or specimens that may result from electronic transmissions. I understand that the practice of medicine is not an exact science and that Practitioner makes no warranties or guarantees regarding treatment outcomes. I acknowledge that a copy of this consent can be made available to me via my patient portal ([facility]), or I can request a printed copy by calling the office of [facility].    I understand that my insurance will be billed for this visit.   I have read or had this consent read to me. I understand the contents of this consent, which adequately explains the benefits and risks of the Services being provided via telemedicine.  I have been provided ample opportunity to ask questions regarding this consent and the Services and have had my questions answered to my satisfaction. I give my informed consent for the services to be provided through the use of telemedicine in my medical care

## 2021-12-13 NOTE — Telephone Encounter (Signed)
Pt agreeable to plan of care and has been scheduled for a tele pre op appt 12/27/21 @ 10 am. Med rec and consent have been done.

## 2021-12-22 ENCOUNTER — Other Ambulatory Visit (HOSPITAL_COMMUNITY): Payer: Self-pay | Admitting: Vascular Surgery

## 2021-12-22 ENCOUNTER — Ambulatory Visit: Payer: Self-pay

## 2021-12-22 ENCOUNTER — Ambulatory Visit (INDEPENDENT_AMBULATORY_CARE_PROVIDER_SITE_OTHER): Payer: Self-pay | Admitting: Vascular Surgery

## 2021-12-22 ENCOUNTER — Encounter: Payer: Self-pay | Admitting: Vascular Surgery

## 2021-12-22 VITALS — BP 130/82 | HR 85 | Temp 99.3°F | Resp 16 | Ht 62.0 in | Wt 121.2 lb

## 2021-12-22 DIAGNOSIS — I739 Peripheral vascular disease, unspecified: Secondary | ICD-10-CM

## 2021-12-22 DIAGNOSIS — I7143 Infrarenal abdominal aortic aneurysm, without rupture: Secondary | ICD-10-CM

## 2021-12-22 NOTE — Progress Notes (Signed)
[facility]  Patient name: Tara Bright MRN: [id number] DOB: May 05, 1966 Sex: female  REASON FOR CONSULT: Evaluation known abdominal aortic aneurysm and discuss mesenteric arterial flow and iliac occlusive disease  HPI: Tara Bright is a 56 y.o. female, who is here today for evaluation.  I had 1 prior office visit with her in 2020.  At that time she was noted to have a small 3 cm aneurysm imaging studies for evaluation of abdominal pain.  She has had ultrasounds since that time showing no significant progression.  Her main Complaint continues to be chronic abdominal pain.  This can be associated with eating.  She did undergo cholecystectomy approximately 2 years ago and reports that this gave her no symptom relief.  She had recent CT scan for continued evaluation and has an upcoming upper and lower endoscopy for further evaluation as well.  She does report diffuse abdominal tenderness mainly in the midline.  She is quite thin and can sense the pulsation in her aneurysm.  She underwent coronary angioplasty and stenting since my last visit with her as well for severe coronary disease.  Fortunately she quit smoking in 2020  Past Medical History:  Diagnosis Date   Abdominal aortic aneurysm (AAA) (HCC)    Abdominal pain    Abnormal weight loss    Ataxia 2016   and visual changes, left AMA before workup completed   CAD (coronary atherosclerotic disease)    Hypertension    Kidney stone    Loss of appetite    Nicotine dependence    Right upper quadrant pain    UTI (urinary tract infection)     Family History  Problem Relation Age of Onset   Stomach cancer Mother    Diabetes Father    Heart failure Father    Heart attack Sister    Hypertension Brother    Alcohol abuse Brother    Diabetes Brother    Kidney failure Brother     SOCIAL HISTORY: Social History   Socioeconomic History   Marital status: Legally Separated     Spouse name: Not on file   Number of children: Not on file   Years of education: Not on file   Highest education level: Not on file  Occupational History   Not on file  Tobacco Use   Smoking status: Former    Packs/day: 0.50    Types: Cigarettes    Quit date: 10/04/2018    Years since quitting: 3.2   Smokeless tobacco: Never  Vaping Use   Vaping Use: Never used  Substance and Sexual Activity   Alcohol use: No   Drug use: No   Sexual activity: Yes    Birth control/protection: None  Other Topics Concern   Not on file  Social History Narrative   Not on file   Social Determinants of Health   Financial Resource Strain: Not on file  Food Insecurity: Not on file  Transportation Needs: Not on file  Physical Activity: Not on file  Stress: Not on file  Social Connections: Not on file  Intimate Partner Violence: Not on file    Allergies  Allergen Reactions   Other Nausea And Vomiting and Other (See Comments)    Just about all pain medications cause Nausea and Vomiting. Must take Phenergan beforehand   Codeine Nausea Only and Other (See Comments)    Gi upset   Penicillins Hives and Nausea And Vomiting    Did it involve swelling of  the face/tongue/throat, SOB, or low BP? No Did it involve sudden or severe rash/hives, skin peeling, or any reaction on the inside of your mouth or nose? Yes Did you need to seek medical attention at a hospital or doctor's office? Yes When did it last happen? Childhood If all above answers are "NO", may proceed with cephalosporin use.     Current Outpatient Medications  Medication Sig Dispense Refill   albuterol (VENTOLIN HFA) 108 (90 Base) MCG/ACT inhaler Inhale 1 puff into the lungs as needed.     amLODipine (NORVASC) 2.5 MG tablet Take 1 tablet (2.5 mg total) by mouth daily. 90 tablet 1   Ascorbic Acid (VITAMIN C) 1000 MG tablet Take 1,000 mg by mouth daily.     atorvastatin (LIPITOR) 80 MG tablet Take 1 tablet (80 mg total) by mouth daily.  (Patient taking differently: Take 40 mg by mouth daily.) 90 tablet 3   Bacillus Coagulans-Inulin (ALIGN PREBIOTIC-PROBIOTIC) 5-1.25 MG-GM CHEW Chew by mouth as directed. 2 GUMMY'S DAILY     bisacodyl (DULCOLAX) 5 MG EC tablet Take 5 mg by mouth daily as needed for moderate constipation.     clopidogrel (PLAVIX) 75 MG tablet Take 1 tablet (75 mg total) by mouth daily. 30 tablet 6   lisinopril (ZESTRIL) 10 MG tablet Take 1 tablet (10 mg total) by mouth daily. 90 tablet 1   metoprolol tartrate (LOPRESSOR) 25 MG tablet Take 0.5 tablets (12.5 mg total) by mouth 2 (two) times daily. 90 tablet 2   naphazoline-pheniramine (NAPHCON-A) 0.025-0.3 % ophthalmic solution Place 1 drop into both eyes 4 (four) times daily as needed for eye irritation.      nitroGLYCERIN (NITROSTAT) 0.4 MG SL tablet Place 1 tablet (0.4 mg total) under the tongue every 5 (five) minutes x 3 doses as needed for chest pain. 25 tablet 2   polyethylene glycol (MIRALAX / GLYCOLAX) 17 g packet Take 17 g by mouth daily.     polyethylene glycol-electrolytes (TRILYTE) 420 g solution Take 4,000 mLs by mouth as directed. 4000 mL 0   pyridOXINE (VITAMIN B-6) 50 MG tablet Take 50 mg by mouth daily.     VITAMIN D, ERGOCALCIFEROL, PO Take by mouth.     zinc gluconate 50 MG tablet Take 50 mg by mouth daily.     No current facility-administered medications for this visit.    REVIEW OF SYSTEMS:  '[X]'$  denotes positive finding, '[ ]'$  denotes negative finding Cardiac  Comments:  Chest pain or chest pressure:    Shortness of breath upon exertion: x   Short of breath when lying flat:    Irregular heart rhythm:        Vascular    Pain in calf, thigh, or hip brought on by ambulation: x   Pain in feet at night that wakes you up from your sleep:  x   Blood clot in your veins:    Leg swelling:         Pulmonary    Oxygen at home:    Productive cough:     Wheezing:         Neurologic    Sudden weakness in arms or legs:  x   Sudden numbness in  arms or legs:  x   Sudden onset of difficulty speaking or slurred speech:    Temporary loss of vision in one eye:  x   Problems with dizziness:         Gastrointestinal    Blood in stool:  Vomited blood:         Genitourinary    Burning when urinating:     Blood in urine:        Psychiatric    Major depression:         Hematologic    Bleeding problems:    Problems with blood clotting too easily:        Skin    Rashes or ulcers:        Constitutional    Fever or chills:      PHYSICAL EXAM: Vitals:   12/22/21 1107  BP: 130/82  Pulse: 85  Resp: 16  Temp: 99.3 F (37.4 C)  TempSrc: Temporal  SpO2: 98%  Weight: 121 lb 3.2 oz (55 kg)  Height: '5\' 2"'$  (1.575 m)    GENERAL: The patient is a well-nourished female, in no acute distress. The vital signs are documented above. CARDIOVASCULAR: 2+ radial pulses bilaterally.  2+ dorsalis pedis pulses bilaterally.  Abdominal exam reveals easily palpable abdominal aortic aneurysm PULMONARY: There is good air exchange  MUSCULOSKELETAL: There are no major deformities or cyanosis. NEUROLOGIC: No focal weakness or paresthesias are detected. SKIN: There are no ulcers or rashes noted. PSYCHIATRIC: The patient has a normal affect.  DATA:  I reviewed her CT images with the patient.  This shows some irregularity of the origin of the celiac artery but no evidence of flow-limiting stenosis.  The superior mesenteric artery is widely patent.  Inferior mesenteric artery has some disease proximally but is patent as well.  She does have calcification of her iliac arteries bilaterally but no critical flow-limiting stenosis.  Aneurysm sac size is without significant change.  Maximal diameter is 3.3 cm.  Noninvasive studies today in our lab revealed normal ankle arm index with triphasic waveforms bilaterally  MEDICAL ISSUES: Reviewed all these issues with the patient.  She does not have any evidence anatomically of mesenteric ischemia to  explain her abdominal pain.  Although she has some calcification of her iliac vessels, she has a normal physical exam and normal physiologic vascular lab studies.  She is having yearly ultrasound with [facility] of her aorta for follow-up and will continue this.  We will see her again on an as-needed basis   Rosetta Posner, MD [facility] [phone] Pager [phone]  Note: Portions of this report may have been transcribed using voice recognition software.  Every effort has been made to ensure accuracy; however, inadvertent computerized transcription errors may still be present.

## 2021-12-27 ENCOUNTER — Telehealth: Payer: Self-pay

## 2022-01-12 ENCOUNTER — Encounter (HOSPITAL_COMMUNITY): Payer: Self-pay

## 2022-01-12 ENCOUNTER — Encounter (HOSPITAL_COMMUNITY)
Admission: RE | Admit: 2022-01-12 | Discharge: 2022-01-12 | Disposition: A | Discharge status: Home / Self Care | Payer: Self-pay | Source: Ambulatory Visit | Attending: Gastroenterology | Admitting: Gastroenterology

## 2022-01-12 HISTORY — DX: Personal history of urinary calculi: Z87.442

## 2022-01-12 HISTORY — DX: Acute myocardial infarction, unspecified: I21.9

## 2022-01-14 ENCOUNTER — Ambulatory Visit (HOSPITAL_COMMUNITY): Payer: Self-pay | Admitting: Anesthesiology

## 2022-01-14 ENCOUNTER — Ambulatory Visit (HOSPITAL_BASED_OUTPATIENT_CLINIC_OR_DEPARTMENT_OTHER): Payer: Self-pay | Admitting: Anesthesiology

## 2022-01-14 ENCOUNTER — Ambulatory Visit (HOSPITAL_COMMUNITY)
Admission: RE | Admit: 2022-01-14 | Discharge: 2022-01-14 | Disposition: A | Discharge status: Home / Self Care | Payer: Self-pay | Attending: Gastroenterology | Admitting: Gastroenterology

## 2022-01-14 ENCOUNTER — Encounter (HOSPITAL_COMMUNITY): Payer: Self-pay | Admitting: Gastroenterology

## 2022-01-14 ENCOUNTER — Encounter (HOSPITAL_COMMUNITY)
Admission: RE | Disposition: A | Discharge status: Home / Self Care | Payer: Self-pay | Source: Home / Self Care | Attending: Gastroenterology

## 2022-01-14 DIAGNOSIS — K811 Chronic cholecystitis: Secondary | ICD-10-CM

## 2022-01-14 DIAGNOSIS — K449 Diaphragmatic hernia without obstruction or gangrene: Secondary | ICD-10-CM

## 2022-01-14 DIAGNOSIS — N1330 Unspecified hydronephrosis: Secondary | ICD-10-CM

## 2022-01-14 DIAGNOSIS — R030 Elevated blood-pressure reading, without diagnosis of hypertension: Secondary | ICD-10-CM

## 2022-01-14 DIAGNOSIS — R6881 Early satiety: Secondary | ICD-10-CM

## 2022-01-14 DIAGNOSIS — K621 Rectal polyp: Secondary | ICD-10-CM | POA: Insufficient documentation

## 2022-01-14 DIAGNOSIS — K2970 Gastritis, unspecified, without bleeding: Secondary | ICD-10-CM | POA: Insufficient documentation

## 2022-01-14 DIAGNOSIS — R1310 Dysphagia, unspecified: Secondary | ICD-10-CM

## 2022-01-14 DIAGNOSIS — N200 Calculus of kidney: Secondary | ICD-10-CM

## 2022-01-14 DIAGNOSIS — I714 Abdominal aortic aneurysm, without rupture, unspecified: Secondary | ICD-10-CM

## 2022-01-14 DIAGNOSIS — I252 Old myocardial infarction: Secondary | ICD-10-CM | POA: Insufficient documentation

## 2022-01-14 DIAGNOSIS — I2510 Atherosclerotic heart disease of native coronary artery without angina pectoris: Secondary | ICD-10-CM | POA: Insufficient documentation

## 2022-01-14 DIAGNOSIS — N390 Urinary tract infection, site not specified: Secondary | ICD-10-CM

## 2022-01-14 DIAGNOSIS — K5900 Constipation, unspecified: Secondary | ICD-10-CM

## 2022-01-14 DIAGNOSIS — R1084 Generalized abdominal pain: Secondary | ICD-10-CM

## 2022-01-14 DIAGNOSIS — R1030 Lower abdominal pain, unspecified: Secondary | ICD-10-CM

## 2022-01-14 DIAGNOSIS — Z87891 Personal history of nicotine dependence: Secondary | ICD-10-CM | POA: Insufficient documentation

## 2022-01-14 DIAGNOSIS — G8929 Other chronic pain: Secondary | ICD-10-CM

## 2022-01-14 DIAGNOSIS — K635 Polyp of colon: Secondary | ICD-10-CM

## 2022-01-14 DIAGNOSIS — R197 Diarrhea, unspecified: Secondary | ICD-10-CM | POA: Insufficient documentation

## 2022-01-14 DIAGNOSIS — R109 Unspecified abdominal pain: Secondary | ICD-10-CM | POA: Insufficient documentation

## 2022-01-14 DIAGNOSIS — Z955 Presence of coronary angioplasty implant and graft: Secondary | ICD-10-CM | POA: Insufficient documentation

## 2022-01-14 DIAGNOSIS — R0989 Other specified symptoms and signs involving the circulatory and respiratory systems: Secondary | ICD-10-CM

## 2022-01-14 DIAGNOSIS — I214 Non-ST elevation (NSTEMI) myocardial infarction: Secondary | ICD-10-CM

## 2022-01-14 DIAGNOSIS — I10 Essential (primary) hypertension: Secondary | ICD-10-CM

## 2022-01-14 DIAGNOSIS — I739 Peripheral vascular disease, unspecified: Secondary | ICD-10-CM

## 2022-01-14 DIAGNOSIS — E871 Hypo-osmolality and hyponatremia: Secondary | ICD-10-CM

## 2022-01-14 DIAGNOSIS — I998 Other disorder of circulatory system: Secondary | ICD-10-CM

## 2022-01-14 DIAGNOSIS — D123 Benign neoplasm of transverse colon: Secondary | ICD-10-CM | POA: Insufficient documentation

## 2022-01-14 HISTORY — PX: BIOPSY: SHX5522

## 2022-01-14 HISTORY — PX: COLONOSCOPY WITH PROPOFOL: SHX5780

## 2022-01-14 HISTORY — PX: ESOPHAGOGASTRODUODENOSCOPY (EGD) WITH PROPOFOL: SHX5813

## 2022-01-14 HISTORY — PX: POLYPECTOMY: SHX5525

## 2022-01-14 LAB — HM COLONOSCOPY

## 2022-01-14 SURGERY — COLONOSCOPY WITH PROPOFOL
Anesthesia: General

## 2022-01-14 MED ORDER — PROPOFOL 10 MG/ML IV BOLUS
INTRAVENOUS | Status: DC | PRN
  Administered 2022-01-14: 100 mg via INTRAVENOUS

## 2022-01-14 MED ORDER — OMEPRAZOLE 40 MG PO CPDR: 40.0000 mg | DELAYED_RELEASE_CAPSULE | Freq: Every day | ORAL | 3 refills | Status: AC

## 2022-01-14 MED ORDER — LACTATED RINGERS IV SOLN: INTRAVENOUS | Status: DC

## 2022-01-14 MED ORDER — PROPOFOL 500 MG/50ML IV EMUL
INTRAVENOUS | Status: DC | PRN
  Administered 2022-01-14: 150 ug/kg/min via INTRAVENOUS

## 2022-01-14 MED ORDER — LINACLOTIDE 145 MCG PO CAPS: 145.0000 ug | ORAL_CAPSULE | Freq: Every day | ORAL | 3 refills | Status: AC

## 2022-01-14 MED ORDER — PHENYLEPHRINE HCL (PRESSORS) 10 MG/ML IV SOLN
INTRAVENOUS | Status: DC | PRN
  Administered 2022-01-14 (×2): 80 ug via INTRAVENOUS
  Administered 2022-01-14: 240 ug via INTRAVENOUS
  Administered 2022-01-14: 160 ug via INTRAVENOUS
  Administered 2022-01-14: 80 ug via INTRAVENOUS

## 2022-01-14 MED ORDER — LIDOCAINE 2% (20 MG/ML) 5 ML SYRINGE
INTRAMUSCULAR | Status: DC | PRN
  Administered 2022-01-14: 60 mg via INTRAVENOUS

## 2022-01-14 MED ORDER — EPHEDRINE SULFATE (PRESSORS) 50 MG/ML IJ SOLN
INTRAMUSCULAR | Status: DC | PRN
  Administered 2022-01-14: 10 mg via INTRAVENOUS

## 2022-01-14 NOTE — Transfer of Care (Addendum)
Immediate Anesthesia Transfer of Care Note  Patient: Tara Bright  Procedure(s) Performed: COLONOSCOPY WITH PROPOFOL ESOPHAGOGASTRODUODENOSCOPY (EGD) WITH PROPOFOL BIOPSY POLYPECTOMY  Patient Location: Short Stay  Anesthesia Type:General  Level of Consciousness: sedated  Airway & Oxygen Therapy: Patient Spontanous Breathing  Post-op Assessment: Report given to RN and Post -op Vital signs reviewed and stable  Post vital signs: Reviewed and stable  Last Vitals:  Vitals Value Taken Time  BP    Temp    Pulse    Resp    SpO2      Last Pain:  Vitals:   01/14/22 1103  TempSrc:   PainSc: 3          Complications: No notable events documented.

## 2022-01-17 ENCOUNTER — Encounter (INDEPENDENT_AMBULATORY_CARE_PROVIDER_SITE_OTHER): Payer: Self-pay | Admitting: *Deleted

## 2022-01-17 LAB — SURGICAL PATHOLOGY

## 2022-01-19 ENCOUNTER — Encounter (HOSPITAL_COMMUNITY): Payer: Self-pay | Admitting: Gastroenterology

## 2022-02-23 ENCOUNTER — Other Ambulatory Visit: Payer: Self-pay

## 2022-04-04 ENCOUNTER — Other Ambulatory Visit: Payer: Self-pay | Admitting: Student

## 2022-04-05 ENCOUNTER — Ambulatory Visit: Payer: Self-pay | Admitting: Cardiology

## 2022-06-21 ENCOUNTER — Ambulatory Visit: Payer: Self-pay | Admitting: Nurse Practitioner

## 2022-07-03 ENCOUNTER — Other Ambulatory Visit: Payer: Self-pay | Admitting: Student

## 2022-09-16 ENCOUNTER — Other Ambulatory Visit: Payer: Self-pay | Admitting: Student

## 2022-10-05 ENCOUNTER — Ambulatory Visit: Payer: Self-pay | Admitting: Cardiology

## 2022-10-06 ENCOUNTER — Ambulatory Visit: Payer: Self-pay | Admitting: Cardiology

## 2022-10-17 ENCOUNTER — Other Ambulatory Visit: Payer: Self-pay | Admitting: Cardiology

## 2022-11-08 ENCOUNTER — Other Ambulatory Visit: Payer: Self-pay | Admitting: Student

## 2022-11-16 ENCOUNTER — Encounter: Payer: Self-pay | Admitting: Cardiology

## 2022-11-16 ENCOUNTER — Ambulatory Visit: Payer: Self-pay | Attending: Cardiology | Admitting: Cardiology

## 2022-11-16 VITALS — BP 122/80 | HR 69 | Ht 62.0 in | Wt 119.0 lb

## 2022-11-16 DIAGNOSIS — I10 Essential (primary) hypertension: Secondary | ICD-10-CM

## 2022-11-16 DIAGNOSIS — E782 Mixed hyperlipidemia: Secondary | ICD-10-CM

## 2022-11-16 DIAGNOSIS — I714 Abdominal aortic aneurysm, without rupture, unspecified: Secondary | ICD-10-CM

## 2022-11-16 DIAGNOSIS — I2510 Atherosclerotic heart disease of native coronary artery without angina pectoris: Secondary | ICD-10-CM

## 2022-11-16 DIAGNOSIS — Z79899 Other long term (current) drug therapy: Secondary | ICD-10-CM

## 2022-11-16 NOTE — Progress Notes (Signed)
Clinical Summary Ms. Zuckerman is a 57 y.o.female  1.CAD - NSTEMI 03/2019, DES x 2 to RCA - recs at time of cath were DAPT x 1 year then plavix monotherapy due to some residual disease - 02/2020 echo LVEF 65-70%   - no chest pain - compliant with meds       2.HTN - compliant with meds     3. Hyperlipidemia - compliant with meds 06/2021 TC 111 TG 193 HDL 28 LDL 44   4. AAA - 3.2 cm by [place] in 08/2018 - followed by vascular, though last visit had mentioned follow up just as needed and continue annual [place] - 02/2021 [place]: 3.3cm AAA, needs repeat 1 year - 11/2021 CTA: Mild fusiform aneurysmal dilatation of the infrarenal abdominal aorta measuring 3.3 cm in diameter,   Elevated velocities in the SMA and IMA, turbulent flow in the celiac axis  - cannot exclude mesenteric ischemia.   5. History of TIA    6. Palpitations 10/2020 monitor: rare ectopy - no recent symptoms   7. Cold extremities - hands and feet very sensitive to cold temperatures - last visit started norvasc   Past Medical History:  Diagnosis Date   Abdominal aortic aneurysm (AAA) (HCC)    Abdominal pain    Abnormal weight loss    Ataxia 2016   and visual changes, left AMA before workup completed   CAD (coronary atherosclerotic disease)    History of kidney stones    Hypertension    Kidney stone    Loss of appetite    Myocardial infarction (HCC)    Nicotine dependence    Right upper quadrant pain    UTI (urinary tract infection)      Allergies  Allergen Reactions   Other Nausea And Vomiting and Other (See Comments)    Just about all pain medications cause Nausea and Vomiting. Must take Phenergan beforehand   Codeine Nausea Only and Other (See Comments)    Gi upset   Penicillins Hives and Nausea And Vomiting    Did it involve swelling of the face/tongue/throat, SOB, or low BP? No Did it involve sudden or severe rash/hives, skin peeling, or any reaction on the inside of your mouth or nose? Yes Did  you need to seek medical attention at a hospital or doctor's office? Yes When did it last happen? Childhood If all above answers are "NO", may proceed with cephalosporin use.      Current Outpatient Medications  Medication Sig Dispense Refill   albuterol (VENTOLIN HFA) 108 (90 Base) MCG/ACT inhaler Inhale 1 puff into the lungs every 6 (six) hours as needed (COPD).     amLODipine (NORVASC) 2.5 MG tablet Take 1 tablet (2.5 mg total) by mouth daily. 90 tablet 1   Ascorbic Acid (VITAMIN C) 1000 MG tablet Take 1,000 mg by mouth daily.     atorvastatin (LIPITOR) 80 MG tablet Take 1 tablet (80 mg total) by mouth daily. 90 tablet 0   Bacillus Coagulans-Inulin (ALIGN PREBIOTIC-PROBIOTIC) 5-1.25 MG-GM CHEW Chew 2 tablets by mouth every morning.  GUMMY'S DAILY     bisacodyl (DULCOLAX) 5 MG EC tablet Take 5 mg by mouth daily.     Cholecalciferol (VITAMIN D-3) 125 MCG (5000 UT) TABS Take 5,000 Units by mouth daily. Monday and Friday     clopidogrel (PLAVIX) 75 MG tablet TAKE 1 TABLET BY MOUTH EVERY DAY 30 tablet 5   linaclotide (LINZESS) 145 MCG CAPS capsule Take 1 capsule (145 mcg  total) by mouth daily. 90 capsule 3   lisinopril (ZESTRIL) 10 MG tablet TAKE 1 TABLET BY MOUTH EVERY DAY 90 tablet 1   metoprolol tartrate (LOPRESSOR) 25 MG tablet TAKE 0.5 TABLETS BY MOUTH 2 TIMES DAILY. 90 tablet 2   naphazoline-pheniramine (VISINE) 0.025-0.3 % ophthalmic solution Place 1 drop into both eyes at bedtime.     nitroGLYCERIN (NITROSTAT) 0.4 MG SL tablet Place 1 tablet (0.4 mg total) under the tongue every 5 (five) minutes x 3 doses as needed for chest pain. 25 tablet 2   omeprazole (PRILOSEC) 40 MG capsule Take 1 capsule (40 mg total) by mouth daily. 90 capsule 3   polyethylene glycol (MIRALAX / GLYCOLAX) 17 g packet Take 17 g by mouth daily.     pyridOXINE (VITAMIN B-6) 50 MG tablet Take 50 mg by mouth daily.     zinc gluconate 50 MG tablet Take 50 mg by mouth daily.     No current facility-administered  medications for this visit.     Past Surgical History:  Procedure Laterality Date   BIOPSY  01/14/2022   Procedure: BIOPSY;  Surgeon: Dolores Frame, MD;  Location: [facility];  Service: Gastroenterology;;   CHOLECYSTECTOMY N/A 12/21/2018   Procedure: LAPAROSCOPIC CHOLECYSTECTOMY;  Surgeon: Lucretia Roers, MD;  Location: [facility];  Service: General;  Laterality: N/A;   COLONOSCOPY WITH PROPOFOL N/A 01/14/2022   Procedure: COLONOSCOPY WITH PROPOFOL;  Surgeon: Dolores Frame, MD;  Location: [facility];  Service: Gastroenterology;  Laterality: N/A;  1050 ASA 2   CORONARY STENT INTERVENTION N/A 04/21/2019   Procedure: CORONARY STENT INTERVENTION;  Surgeon: Lyn Records, MD;  Location: [facility];  Service: Cardiovascular;  Laterality: N/A;   CYSTOSCOPY W/ URETERAL STENT PLACEMENT Right 04/05/2017   Procedure: CYSTOSCOPY WITH RETROGRADE PYELOGRAM/URETERAL STENT PLACEMENT;  Surgeon: Crista Elliot, MD;  Location: [facility];  Service: Urology;  Laterality: Right;   CYSTOSCOPY/URETEROSCOPY/HOLMIUM LASER/STENT PLACEMENT Right 04/26/2017   Procedure: CYSTOSCOPY/URETEROSCOPY/HOLMIUM LASER/STENT PLACEMENT;  Surgeon: Crista Elliot, MD;  Location: [facility];  Service: Urology;  Laterality: Right;   ESOPHAGOGASTRODUODENOSCOPY (EGD) WITH PROPOFOL N/A 01/14/2022   Procedure: ESOPHAGOGASTRODUODENOSCOPY (EGD) WITH PROPOFOL;  Surgeon: Dolores Frame, MD;  [facility]: [facility];  Service: Gastroenterology;  Laterality: N/A;   LEFT HEART CATH AND CORONARY ANGIOGRAPHY N/A 04/21/2019   Procedure: LEFT HEART CATH AND CORONARY ANGIOGRAPHY;  Surgeon: Lyn Records, MD;  [facility]: [facility];  Service: Cardiovascular;  Laterality: N/A;   LITHOTRIPSY     POLYPECTOMY  01/14/2022   Procedure: POLYPECTOMY;  Surgeon: Dolores Frame, MD;  [facility]: [facility];  Service: Gastroenterology;;     Allergies  Allergen Reactions   Other Nausea And Vomiting  and Other (See Comments)    Just about all pain medications cause Nausea and Vomiting. Must take Phenergan beforehand   Codeine Nausea Only and Other (See Comments)    Gi upset   Penicillins Hives and Nausea And Vomiting    Did it involve swelling of the face/tongue/throat, SOB, or low BP? No Did it involve sudden or severe rash/hives, skin peeling, or any reaction on the inside of your mouth or nose? Yes Did you need to seek medical attention at a hospital or doctor's office? Yes When did it last happen? Childhood If all above answers are "NO", may proceed with cephalosporin use.       Family History  Problem Relation Age of Onset   Stomach cancer  Mother    Diabetes Father    Heart failure Father    Heart attack Sister    Hypertension Brother    Alcohol abuse Brother    Diabetes Brother    Kidney failure Brother      Social History Ms. Ey reports that she quit smoking about 4 years ago. Her smoking use included cigarettes. She smoked an average of .5 packs per day. She has never used smokeless tobacco. Ms. Riege reports no history of alcohol use.   Review of Systems CONSTITUTIONAL: No weight loss, fever, chills, weakness or fatigue.  HEENT: Eyes: No visual loss, blurred vision, double vision or yellow sclerae.No hearing loss, sneezing, congestion, runny nose or sore throat.  SKIN: No rash or itching.  CARDIOVASCULAR: per hpi RESPIRATORY: No shortness of breath, cough or sputum.  GASTROINTESTINAL: No anorexia, nausea, vomiting or diarrhea. No abdominal pain or blood.  GENITOURINARY: No burning on urination, no polyuria NEUROLOGICAL: No headache, dizziness, syncope, paralysis, ataxia, numbness or tingling in the extremities. No change in bowel or bladder control.  MUSCULOSKELETAL: No muscle, back pain, joint pain or stiffness.  LYMPHATICS: No enlarged nodes. No history of splenectomy.  PSYCHIATRIC: No history of depression or anxiety.  ENDOCRINOLOGIC: No reports of  sweating, cold or heat intolerance. No polyuria or polydipsia.  Marland Kitchen   Physical Examination Today's Vitals   11/16/22 1023  BP: 122/80  Pulse: 69  SpO2: 98%  Weight: 119 lb (54 kg)  Height:  (1.575 m)   Body mass index is 21.77 kg/m.  Gen: resting comfortably, no acute distress HEENT: no scleral icterus, pupils equal round and reactive, no palptable cervical adenopathy,  CV: RRR, no mrg, no jvd Resp: Clear to auscultation bilaterally GI: abdomen is soft, non-tender, non-distended, normal bowel sounds, no hepatosplenomegaly MSK: extremities are warm, no edema.  Skin: warm, no rash Neuro:  no focal deficits Psych: appropriate affect   Diagnostic Studies   TTE: 04/22/19   IMPRESSIONS      1. Left ventricular ejection fraction, by visual estimation, is 65 to 70%. The left ventricle has normal function. Normal left ventricular size. There is no left ventricular hypertrophy.  2. Global right ventricle has normal systolic function.The right ventricular size is normal. No increase in right ventricular wall thickness.  3. Left atrial size was normal.  4. Right atrial size was normal.  5. The mitral valve is normal in structure. Trace mitral valve regurgitation. No evidence of mitral stenosis.  6. The tricuspid valve is normal in structure. Tricuspid valve regurgitation is trivial.  7. The aortic valve is normal in structure. Aortic valve regurgitation was not visualized by color flow Doppler. Structurally normal aortic valve, with no evidence of sclerosis or stenosis.  8. The pulmonic valve was normal in structure. Pulmonic valve regurgitation is not visualized by color flow Doppler.  9. TR signal is inadequate for assessing pulmonary artery systolic pressure. 10. The inferior vena cava is normal in size with greater than 50% respiratory variability, suggesting right atrial pressure of 3 mmHg.  _____________ Cath: 04/21/19   Non-ST elevation myocardial infarction due to 99% mid  RCA stenosis with TIMI flow II beyond the stenosis.  The lesion appears to be thrombotic. Successful ostial to proximal and mid to distal RCA stenting was performed, reducing all areas of obstruction to less than 20%.  Final postdilatation balloon diameter and all segments stented with 3.75 mm (a 3.5 x 20 Synergy stent was placed proximally and a 38 x 3.0 stent placed  in the mid to distal).  TIMI flow postintervention was normal. Normal left main 30% mid LAD 60 to 70% mid circumflex, 60 to 70% second obtuse marginal, and 75% distal circumflex. Low normal LV systolic function with EF 50% and inferobasal hypokinesis.  LVEDP is normal.   RECOMMENDATIONS:   IV nitroglycerin has been discontinued. IV fluid at 75 cc/h will be continued until the a.m. Aggrastat will run for 2 hours and then be discontinued. Aspirin and Brilinta should be continued for 12 months and then consider switching to clopidogrel monotherapy. Aggressive risk factor modification with LDL cholesterol less than 55.  We should also check an LP(a), hs-CRP. Consider VASCEPA.     02/2020 echo  1. Left ventricular ejection fraction, by estimation, is 65 to 70%. The  left ventricle has normal function. The left ventricle has no regional  wall motion abnormalities. Left ventricular diastolic parameters were  normal.   2. Right ventricular systolic function is normal. The right ventricular  size is normal. There is normal pulmonary artery systolic pressure.   3. The mitral valve is normal in structure. Trivial mitral valve  regurgitation. No evidence of mitral stenosis.   4. The aortic valve is tricuspid. Aortic valve regurgitation is not  visualized. No aortic stenosis is present.   5. The inferior vena cava is normal in size with greater than 50%  respiratory variability, suggesting right atrial pressure of 3 mmHg.   Assessment and Plan   1.CAD - no symptoms - has been on plavix montherapy - continue current meds - EKG  today shows SR, no ischemic changes   2. HTN - she is at goal, continue current meds   3.Hyperlipidemia - prior MI and TIA, would classify as very high risk and goal LDL <55 - recheck lipid panel   4. AAA - needs repeat US 2026 based on 2023 CTA results        Antoine Poche, M.D.

## 2022-11-16 NOTE — Patient Instructions (Addendum)
Medication Instructions:  Your physician recommends that you continue on your current medications as directed. Please refer to the Current Medication list given to you today.  Labwork: Your physician recommends that you return for a FASTING lipid profile: within the next week. Please do not eat or drink for at least 8 hours when you have this done. You may take your medications that morning with a sip of water. This may be done at  [facility] ([address] [place]) Monday-Friday from 8:00 am - 4:00 pm. No appointment is needed.  Testing/Procedures: none  Follow-Up: Your physician recommends that you schedule a follow-up appointment in: 6 months  Any Other Special Instructions Will Be Listed Below (If Applicable).  If you need a refill on your cardiac medications before your next appointment, please call your pharmacy.

## 2023-02-05 ENCOUNTER — Other Ambulatory Visit: Payer: Self-pay | Admitting: Cardiology

## 2023-04-13 ENCOUNTER — Other Ambulatory Visit: Payer: Self-pay | Admitting: Cardiology

## 2023-05-22 ENCOUNTER — Ambulatory Visit: Payer: Self-pay | Admitting: Cardiology

## 2023-05-23 ENCOUNTER — Ambulatory Visit: Payer: Self-pay | Admitting: Cardiology

## 2023-06-13 ENCOUNTER — Other Ambulatory Visit: Payer: Self-pay | Admitting: Cardiology

## 2023-06-28 ENCOUNTER — Ambulatory Visit: Payer: Self-pay | Attending: Nurse Practitioner | Admitting: Nurse Practitioner

## 2023-06-28 ENCOUNTER — Encounter: Payer: Self-pay | Admitting: Nurse Practitioner

## 2023-06-28 VITALS — BP 110/60 | HR 74 | Ht 63.0 in | Wt 110.0 lb

## 2023-06-28 DIAGNOSIS — I2510 Atherosclerotic heart disease of native coronary artery without angina pectoris: Secondary | ICD-10-CM

## 2023-06-28 DIAGNOSIS — I10 Essential (primary) hypertension: Secondary | ICD-10-CM

## 2023-06-28 DIAGNOSIS — R1013 Epigastric pain: Secondary | ICD-10-CM

## 2023-06-28 DIAGNOSIS — Z8673 Personal history of transient ischemic attack (TIA), and cerebral infarction without residual deficits: Secondary | ICD-10-CM

## 2023-06-28 DIAGNOSIS — I714 Abdominal aortic aneurysm, without rupture, unspecified: Secondary | ICD-10-CM

## 2023-06-28 DIAGNOSIS — R2990 Unspecified symptoms and signs involving the nervous system: Secondary | ICD-10-CM

## 2023-06-28 DIAGNOSIS — I252 Old myocardial infarction: Secondary | ICD-10-CM

## 2023-06-28 DIAGNOSIS — I214 Non-ST elevation (NSTEMI) myocardial infarction: Secondary | ICD-10-CM

## 2023-06-28 DIAGNOSIS — R634 Abnormal weight loss: Secondary | ICD-10-CM

## 2023-06-28 DIAGNOSIS — R42 Dizziness and giddiness: Secondary | ICD-10-CM

## 2023-06-28 DIAGNOSIS — E785 Hyperlipidemia, unspecified: Secondary | ICD-10-CM

## 2023-06-28 MED ORDER — LISINOPRIL 5 MG PO TABS: 5.0000 mg | ORAL_TABLET | Freq: Every day | ORAL | 1 refills | Status: AC

## 2023-06-28 NOTE — Progress Notes (Signed)
Cardiology Office Note:  .   Date: 06/28/2023 ID:  Tara Bright, DOB Jun 16, 1966, MRN [id number] PCP: Richardean Chimera, MD  [facility] Providers Cardiologist:  Dina Rich, MD    History of Present Illness: Marland Kitchen   Tara Bright is a 57 y.o. female with a PMH of CAD, history of NSTEMI in 2020, received drug-eluting stent x 2 to RCA, hypertension, hyperlipidemia, history of TIA, palpitations, history of cold extremities and hypersensitivity to cold temperatures, history of AAA (3.3 cm in diameter May 2023), who presents today for 37-month follow-up appointment.   Last seen by Dr. Dina Rich on April 24th, 2024.  She was overall doing well at the time.  Repeat lipid panel was ordered as she was high risk for ASCVD due to past history of MI and TIA, appears that this was not obtained.  Today she presents for follow-up.  She states...  She has not been doing well recently.  She presents with multiple neurological concerns including "dead" left arm that feels heavy, has been having episodes at night where her arm feels "frozen."  Says she thought a snake was in her bed one night and was going to swing it away, said it was "frozen."  She says her BP was labile last Thursday, says she believes she had a possible TIA, has had peripheral vision loss along her right eye.  She admits to random episodes of dizziness and says she will forget what she is talking about in mid conversation with someone, the forgetfulness during conversation has been going on for the last 6 months but says recently has been getting worse.  She tells me she had blood work with Dr. Wyline Mood last spring at [facility] in [place], however I do not see these results available.  She also endorses epigastric pain, says she believes and knows this is coming from her AAA, she points to her epigastric area and says the pain is sharp.  She says her main concern today is her unintentional weight loss, per our records she has lost 9 pounds  since last office visit unintentionally.  She says Ensure shakes have not been helping as this is caused her to lose more weight. Denies any chest pain, shortness of breath, palpitations, syncope, presyncope, orthopnea, PND, swelling or acute bleeding, or claudication.  ROS: Negative. See HPI.   Studies Reviewed: Marland Kitchen    EKG: EKG Interpretation Date/Time:  Wednesday June 28 2023 11:17:19 EST Ventricular Rate:  78 PR Interval:  142 QRS Duration:  72 QT Interval:  378 QTC Calculation: 430 R Axis:   80  Text Interpretation: Normal sinus rhythm with sinus arrhythmia Normal ECG When compared with ECG of 04-Feb-2020 14:55, No significant change was found Confirmed by Sharlene Dory [phone]) on 06/28/2023 11:26:00 AM   ABI's 11/2021:  Summary:  Right: Resting right ankle-brachial index is within normal range. No  evidence of significant right lower extremity arterial disease. The right  toe-brachial index is normal.   Left: Resting left ankle-brachial index is within normal range. No  evidence of significant left lower extremity arterial disease. The left  toe-brachial index is normal.  Vascular US AAA duplex 02/2021:  Summary:  Abdominal Aorta: There is evidence of abnormal dilatation of the mid and  distal Abdominal aorta.  Stenosis:  Stable dimensions of infrarenal fusiform AAA 3.1 cm x 3.1 cm.   Elevated velocities in the SMA and IMA, turbulent flow in the celiac axis  - cannot exclude mesenteric ischemia.    *See  table(s) above for measurements and observations.  Suggest follow up study in 12 months.  Vascular consult recommended.  Cardiac monitor 10/2020:  Patch Wear Time:  4 days and 0 hours (2022-04-01T09:41:23-0400 to 2022-04-05T10:04:08-398)   Patient had a min HR of 48 bpm, max HR of 101 bpm, and avg HR of 62 bpm. Predominant underlying rhythm was Sinus Rhythm. Isolated SVEs were rare (<1.0%), SVE Couplets were rare (<1.0%), and no SVE Triplets were present. No Isolated  VEs, VE Couplets, or  VE Triplets were present.   Echo 02/2020:  1. Left ventricular ejection fraction, by estimation, is 65 to 70%. The  left ventricle has normal function. The left ventricle has no regional  wall motion abnormalities. Left ventricular diastolic parameters were  normal.   2. Right ventricular systolic function is normal. The right ventricular  size is normal. There is normal pulmonary artery systolic pressure.   3. The mitral valve is normal in structure. Trivial mitral valve  regurgitation. No evidence of mitral stenosis.   4. The aortic valve is tricuspid. Aortic valve regurgitation is not  visualized. No aortic stenosis is present.   5. The inferior vena cava is normal in size with greater than 50%  respiratory variability, suggesting right atrial pressure of 3 mmHg.   Comparison(s): Echocardiogram done 04/22/19 showed an EF of 65-70%.  LHC 03/2019:  A stent was successfully placed.   Non-ST elevation myocardial infarction due to 99% mid RCA stenosis with TIMI flow II beyond the stenosis.  The lesion appears to be thrombotic. Successful ostial to proximal and mid to distal RCA stenting was performed, reducing all areas of obstruction to less than 20%.  Final postdilatation balloon diameter and all segments stented with 3.75 mm (a 3.5 x 20 Synergy stent was placed proximally and a 38 x 3.0 stent placed in the mid to distal).  TIMI flow postintervention was normal. Normal left main 30% mid LAD 60 to 70% mid circumflex, 60 to 70% second obtuse marginal, and 75% distal circumflex. Low normal LV systolic function with EF 50% and inferobasal hypokinesis.  LVEDP is normal.   RECOMMENDATIONS:   IV nitroglycerin has been discontinued. IV fluid at 75 cc/h will be continued until the a.m. Aggrastat will run for 2 hours and then be discontinued. Aspirin and Brilinta should be continued for 12 months and then consider switching to clopidogrel monotherapy. Aggressive risk  factor modification with LDL cholesterol less than 55.  We should also check an LP(a), hs-CRP. Consider VASCEPA.  Carotid duplex 08/2018: IMPRESSION: 1. Bilateral carotid bifurcation plaque resulting in less than 50% diameter stenosis. 2. Antegrade bilateral vertebral arterial flow.  Physical Exam:   VS:  BP 110/60   Pulse 74   Ht 5\' 3"  (1.6 m)   Wt 110 lb (49.9 kg)   LMP 03/08/2017   SpO2 95%   BMI 19.49 kg/m    Wt Readings from Last 3 Encounters:  06/28/23 110 lb (49.9 kg)  11/16/22 119 lb (54 kg)  01/14/22 117 lb 1 oz (53.1 kg)    Orthostatic VS: Lying: 119/80, 77 bpm Sitting: 98/64, 82 bpm Standing: 112/76, 86 bpm Standing at 3 minutes: 109/76, 90 bpm   GEN: Thin, 57 y.o. female in no acute distress NECK: No JVD; No carotid bruits CARDIAC: S1/S2, RRR, no murmurs, rubs, gallops RESPIRATORY:  Clear to auscultation without rales, wheezing or rhonchi  ABDOMEN: Soft, flat, non-tender, enlarged abdominal aorta noted on exam with pulsation noted.  EXTREMITIES:  No edema; No deformity  ASSESSMENT AND PLAN: .    CAD, s/p NSTEMI Denies any chest pain. No indication for ischemic evaluation.  Instructed her to restart aspirin 81 mg daily.  Continue atorvastatin, Plavix, lisinopril, Lopressor, and nitroglycerin as needed. Heart healthy diet and regular cardiovascular exercise encouraged.  Care and ED precautions discussed.  HTN, dizziness BP stable.  Orthostatic vital signs revealed component of orthostasis on exam.  Instructed her to stop amlodipine and will reduce lisinopril to 5 mg daily.  Discussed to monitor BP at home at least 2 hours after medications and sitting for 5-10 minutes.  No other medication changes at this time.  Discussed conservative measures and care and ED precautions discussed. Continue to follow with PCP.  HLD Patient states she had previous lab work done at last office visit, however I do not see these records.  Will obtain FLP and CMET as mentioned  above.  Continue current medication regimen. Heart healthy diet and regular cardiovascular exercise encouraged.   AAA CT scan in May 2023 revealed mild fusiform aneurysm dilatation of the infrarenal abdominal aorta measuring 3.3 cm in diameter.  Dr. Wyline Mood recommended repeating ultrasound in 2026, patient admits to bothersome abdominal pain-says she had this at last office visit with Dr. Wyline Mood.  AAA ultrasound in 2022 revealed stable AAA with elevated velocities in the SMA and IMA, turbulent flow in the celiac axis, mesenteric ischemia could not be excluded, was recommended to repeat study in 12 months at that time.  Will repeat ultrasound at this time.  Care and ED precautions discussed.  Hx of TIA, neurological symptoms She has a remote history of TIA.  She admits to recent symptoms that she believes are due to TIA.  She admits to multiple neurological symptoms-see HPI.  Recommended neurology evaluation, however patient declines and states she will talk to her PCP about her recent symptoms.  No medication changes at this time.  Care and ED precautions discussed.   6.  Unintentional weight loss, epigastric pain Has lost close to 10 pounds unintentionally since last office visit.  Will obtain thyroid panel and Mag level in addition to lab work as mentioned above.  Recommended to follow-up with PCP for further evaluation. Care and ED precautions discussed.   Dispo: Follow-up with me/[facility] in 6 to 8 weeks or sooner if anything changes.  Signed, Sharlene Dory, NP

## 2023-06-28 NOTE — Patient Instructions (Addendum)
Medication Instructions:  Your physician has recommended you make the following change in your medication:  Please Restart Asprin 81 Mg daily  Please stop Amlodipine  Please reduce Lisinopril to 5 Mg daily   Labwork: In 1 week at [facility]   Testing/Procedures: Your physician has requested that you have an abdominal aorta duplex. During this test, an ultrasound is used to evaluate the aorta. Allow 30 minutes for this exam. Do not eat after midnight the day before and avoid carbonated beverages.  Please note: We ask at that you not bring children with you during ultrasound (echo/ vascular) testing. Due to room size and safety concerns, children are not allowed in the ultrasound rooms during exams. Our front office staff cannot provide observation of children in our lobby area while testing is being conducted. An adult accompanying a patient to their appointment will only be allowed in the ultrasound room at the discretion of the ultrasound technician under special circumstances. We apologize for any inconvenience.  Follow-Up: Your physician recommends that you schedule a follow-up appointment in: 6-8 weeks   Any Other Special Instructions Will Be Listed Below (If Applicable).  If you need a refill on your cardiac medications before your next appointment, please call your pharmacy.

## 2023-06-30 ENCOUNTER — Other Ambulatory Visit (HOSPITAL_COMMUNITY)
Admission: RE | Admit: 2023-06-30 | Discharge: 2023-06-30 | Disposition: A | Discharge status: Home / Self Care | Payer: Self-pay | Source: Ambulatory Visit | Attending: Nurse Practitioner | Admitting: Nurse Practitioner

## 2023-06-30 DIAGNOSIS — I714 Abdominal aortic aneurysm, without rupture, unspecified: Secondary | ICD-10-CM | POA: Insufficient documentation

## 2023-06-30 DIAGNOSIS — I214 Non-ST elevation (NSTEMI) myocardial infarction: Secondary | ICD-10-CM | POA: Insufficient documentation

## 2023-06-30 DIAGNOSIS — I2510 Atherosclerotic heart disease of native coronary artery without angina pectoris: Secondary | ICD-10-CM | POA: Insufficient documentation

## 2023-06-30 LAB — COMPREHENSIVE METABOLIC PANEL
ALT: 23 U/L (ref 0–44)
AST: 20 U/L (ref 15–41)
Albumin: 4.7 g/dL (ref 3.5–5.0)
Alkaline Phosphatase: 80 U/L (ref 38–126)
Anion gap: 10 (ref 5–15)
BUN: 9 mg/dL (ref 6–20)
CO2: 24 mmol/L (ref 22–32)
Calcium: 9.8 mg/dL (ref 8.9–10.3)
Chloride: 103 mmol/L (ref 98–111)
Creatinine, Ser: 0.81 mg/dL (ref 0.44–1.00)
GFR, Estimated: >60 mL/min (ref >60)
Glucose, Bld: 114 mg/dL — ABNORMAL HIGH (ref 70–99)
Potassium: 3.5 mmol/L (ref 3.5–5.1)
Sodium: 137 mmol/L (ref 135–145)
Total Bilirubin: 0.4 mg/dL (ref <1.2)
Total Protein: 7.7 g/dL (ref 6.5–8.1)

## 2023-06-30 LAB — LIPID PANEL
Cholesterol: 72 mg/dL (ref 0–200)
HDL: 30 mg/dL — ABNORMAL LOW (ref >40)
LDL Cholesterol: 27 mg/dL (ref 0–99)
Total CHOL/HDL Ratio: 2.4 {ratio}
Triglycerides: 77 mg/dL (ref <150)
VLDL: 15 mg/dL (ref 0–40)

## 2023-06-30 LAB — MAGNESIUM: Magnesium: 1.7 mg/dL (ref 1.7–2.4)

## 2023-07-01 LAB — THYROID PANEL WITH TSH
Free Thyroxine Index: 1.6 (ref 1.2–4.9)
T3 Uptake Ratio: 22 % — ABNORMAL LOW (ref 24–39)
T4, Total: 7.3 ug/dL (ref 4.5–12.0)
TSH: 8.65 u[IU]/mL — ABNORMAL HIGH (ref 0.450–4.500)

## 2023-07-06 ENCOUNTER — Ambulatory Visit: Payer: Self-pay | Attending: Nurse Practitioner

## 2023-07-06 DIAGNOSIS — I714 Abdominal aortic aneurysm, without rupture, unspecified: Secondary | ICD-10-CM

## 2023-08-10 ENCOUNTER — Ambulatory Visit: Payer: Self-pay | Admitting: Nurse Practitioner

## 2023-11-20 ENCOUNTER — Other Ambulatory Visit: Payer: Self-pay | Admitting: Cardiology

## 2024-01-19 ENCOUNTER — Other Ambulatory Visit: Payer: Self-pay | Admitting: Cardiology

## 2024-02-07 ENCOUNTER — Other Ambulatory Visit: Payer: Self-pay | Admitting: Cardiology

## 2024-03-06 ENCOUNTER — Other Ambulatory Visit: Payer: Self-pay | Admitting: Cardiology

## 2024-06-09 ENCOUNTER — Other Ambulatory Visit: Payer: Self-pay | Admitting: Cardiology
